# Patient Record
Sex: Female | Born: 1950
Health system: Southern US, Community
[De-identification: ages and names within clinical notes are randomized; demographics above are authoritative.]

## PROBLEM LIST (undated history)

## (undated) DIAGNOSIS — G43909 Migraine, unspecified, not intractable, without status migrainosus: Secondary | ICD-10-CM

## (undated) DIAGNOSIS — F419 Anxiety disorder, unspecified: Secondary | ICD-10-CM

## (undated) DIAGNOSIS — M810 Age-related osteoporosis without current pathological fracture: Secondary | ICD-10-CM

## (undated) DIAGNOSIS — F329 Major depressive disorder, single episode, unspecified: Secondary | ICD-10-CM

## (undated) DIAGNOSIS — M1990 Unspecified osteoarthritis, unspecified site: Secondary | ICD-10-CM

## (undated) DIAGNOSIS — E785 Hyperlipidemia, unspecified: Secondary | ICD-10-CM

## (undated) DIAGNOSIS — M419 Scoliosis, unspecified: Secondary | ICD-10-CM

## (undated) DIAGNOSIS — G4730 Sleep apnea, unspecified: Secondary | ICD-10-CM

## (undated) DIAGNOSIS — I2510 Atherosclerotic heart disease of native coronary artery without angina pectoris: Secondary | ICD-10-CM

## (undated) DIAGNOSIS — F32A Depression, unspecified: Secondary | ICD-10-CM

## (undated) DIAGNOSIS — R42 Dizziness and giddiness: Secondary | ICD-10-CM

## (undated) DIAGNOSIS — H269 Unspecified cataract: Secondary | ICD-10-CM

## (undated) DIAGNOSIS — J439 Emphysema, unspecified: Secondary | ICD-10-CM

## (undated) DIAGNOSIS — J449 Chronic obstructive pulmonary disease, unspecified: Secondary | ICD-10-CM

## (undated) DIAGNOSIS — R5383 Other fatigue: Secondary | ICD-10-CM

## (undated) DIAGNOSIS — R011 Cardiac murmur, unspecified: Secondary | ICD-10-CM

## (undated) DIAGNOSIS — E119 Type 2 diabetes mellitus without complications: Secondary | ICD-10-CM

## (undated) HISTORY — PX: EYE SURGERY: SHX253

## (undated) HISTORY — DX: Anxiety disorder, unspecified: F41.9

## (undated) HISTORY — DX: Type 2 diabetes mellitus without complications: E11.9

## (undated) HISTORY — DX: Sleep apnea, unspecified: G47.30

## (undated) HISTORY — DX: Age-related osteoporosis without current pathological fracture: M81.0

## (undated) HISTORY — DX: Depression, unspecified: F32.A

## (undated) HISTORY — DX: Unspecified cataract: H26.9

## (undated) HISTORY — PX: BREAST CYST ASPIRATION: SHX578

## (undated) HISTORY — DX: Atherosclerotic heart disease of native coronary artery without angina pectoris: I25.10

## (undated) HISTORY — DX: Hyperlipidemia, unspecified: E78.5

## (undated) HISTORY — DX: Chronic obstructive pulmonary disease, unspecified: J44.9

## (undated) HISTORY — DX: Cardiac murmur, unspecified: R01.1

## (undated) HISTORY — DX: Emphysema, unspecified: J43.9

## (undated) HISTORY — DX: Major depressive disorder, single episode, unspecified: F32.9

---
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
## 2004-08-22 HISTORY — PX: COLONOSCOPY: SHX174

## 2005-01-27 ENCOUNTER — Ambulatory Visit: Payer: Self-pay

## 2005-04-13 ENCOUNTER — Ambulatory Visit: Payer: Self-pay

## 2006-02-16 ENCOUNTER — Ambulatory Visit: Payer: Self-pay | Admitting: Family Medicine

## 2007-02-20 ENCOUNTER — Ambulatory Visit: Payer: Self-pay | Admitting: Family Medicine

## 2008-02-25 ENCOUNTER — Ambulatory Visit: Payer: Self-pay | Admitting: Family Medicine

## 2009-02-26 ENCOUNTER — Ambulatory Visit: Payer: Self-pay | Admitting: Family Medicine

## 2010-03-01 ENCOUNTER — Ambulatory Visit: Payer: Self-pay | Admitting: Family Medicine

## 2011-03-03 ENCOUNTER — Ambulatory Visit: Payer: Self-pay | Admitting: Family Medicine

## 2012-03-05 ENCOUNTER — Ambulatory Visit: Payer: Self-pay | Admitting: Family Medicine

## 2013-03-13 ENCOUNTER — Ambulatory Visit: Payer: Self-pay | Admitting: Family Medicine

## 2014-04-11 ENCOUNTER — Ambulatory Visit: Payer: Self-pay | Admitting: Family Medicine

## 2014-08-07 DIAGNOSIS — E782 Mixed hyperlipidemia: Secondary | ICD-10-CM

## 2014-08-07 DIAGNOSIS — E785 Hyperlipidemia, unspecified: Secondary | ICD-10-CM | POA: Insufficient documentation

## 2014-10-13 ENCOUNTER — Ambulatory Visit: Payer: Self-pay | Admitting: Family Medicine

## 2014-12-10 ENCOUNTER — Ambulatory Visit
Admit: 2014-12-10 | Disposition: A | Discharge status: Home / Self Care | Payer: Self-pay | Attending: Family Medicine | Admitting: Family Medicine

## 2015-03-23 ENCOUNTER — Other Ambulatory Visit: Payer: Self-pay | Admitting: Family Medicine

## 2015-04-10 ENCOUNTER — Encounter: Payer: Self-pay | Admitting: Family Medicine

## 2015-04-13 ENCOUNTER — Ambulatory Visit (INDEPENDENT_AMBULATORY_CARE_PROVIDER_SITE_OTHER): Payer: BLUE CROSS/BLUE SHIELD | Admitting: Family Medicine

## 2015-04-13 ENCOUNTER — Encounter: Payer: Self-pay | Admitting: Family Medicine

## 2015-04-13 VITALS — BP 116/80 | HR 94 | Temp 98.6°F | Resp 16 | Ht 68.0 in | Wt 205.3 lb

## 2015-04-13 DIAGNOSIS — Z0000 Encounter for general adult medical examination without abnormal findings: Secondary | ICD-10-CM | POA: Diagnosis not present

## 2015-04-13 DIAGNOSIS — Z87891 Personal history of nicotine dependence: Secondary | ICD-10-CM | POA: Insufficient documentation

## 2015-04-13 NOTE — Progress Notes (Signed)
Name: Kathy Gregory   MRN: [id number]    DOB: 1951/04/18   Date:04/13/2015       Progress Note  Subjective  Chief Complaint  Chief Complaint  Patient presents with  . Annual Exam    HPI  64 year old female here for annual H&P no new exacerbation of her baseline problems.  Past Medical History  Diagnosis Date  . Anxiety   . Depression   . Hyperlipidemia     Social History  Substance Use Topics  . Smoking status: Current Every Day Smoker  . Smokeless tobacco: Not on file  . Alcohol Use: 0.0 oz/week    0 Standard drinks or equivalent per week     Current outpatient prescriptions:  .  atorvastatin (LIPITOR) 10 MG tablet, TAKE 1 TABLET EACH NIGHT AT BEDTIME, Disp: 30 tablet, Rfl: 5 .  calcium-vitamin D (OSCAL WITH D) 250-125 MG-UNIT per tablet, Take 1 tablet by mouth daily., Disp: , Rfl:  .  clonazePAM (KLONOPIN) 0.5 MG tablet, , Disp: , Rfl:  .  Fish Oil-Cholecalciferol (FISH OIL + D3) 1000-1000 MG-UNIT CAPS, Take by mouth., Disp: , Rfl:  .  psyllium (REGULOID) 0.52 G capsule, Take 0.52 g by mouth daily., Disp: , Rfl:  .  traZODone (DESYREL) 50 MG tablet, TAKE 1/2 TO 1 TABLET AT BEDTIME AND 1/4 TABLET TWICE DAILY AS NEEDED, Disp: , Rfl: 5  No Known Allergies  Review of Systems  Constitutional: Negative for fever, chills and weight loss.  HENT: Negative for congestion, hearing loss, sore throat and tinnitus.   Eyes: Negative for blurred vision, double vision and redness.  Respiratory: Negative for cough, hemoptysis and shortness of breath.   Cardiovascular: Negative for chest pain, palpitations, orthopnea, claudication and leg swelling.  Gastrointestinal: Negative for heartburn, nausea, vomiting, diarrhea, constipation and blood in stool.  Genitourinary: Negative for dysuria, urgency, frequency and hematuria.  Musculoskeletal: Negative for myalgias, back pain, joint pain, falls and neck pain.  Skin: Negative for itching.  Neurological: Negative for dizziness, tingling,  tremors, focal weakness, seizures, loss of consciousness, weakness and headaches.  Endo/Heme/Allergies: Does not bruise/bleed easily.  Psychiatric/Behavioral: Positive for depression. Negative for substance abuse. The patient is nervous/anxious. The patient does not have insomnia.      Objective  Filed Vitals:   04/13/15 0949  BP: 116/80  Pulse: 94  Temp: 98.6 F (37 C)  Resp: 16  Height: 5\' 8"  (1.727 m)  Weight: 205 lb 5 oz (93.129 kg)  SpO2: 98%     Physical Exam  Constitutional: She is oriented to person, place, and time and well-developed, well-nourished, and in no distress.  HENT:  Head: Normocephalic.  Eyes: EOM are normal. Pupils are equal, round, and reactive to light.  Neck: Normal range of motion. No thyromegaly present.  Cardiovascular: Normal rate, regular rhythm and normal heart sounds.   No murmur heard. Pulmonary/Chest: Effort normal and breath sounds normal.  Breasts exam is normal  Abdominal: Soft. Bowel sounds are normal.  Musculoskeletal: Normal range of motion. She exhibits no edema.  Neurological: She is alert and oriented to person, place, and time. No cranial nerve deficit. Gait normal.  Skin: Skin is warm and dry. No rash noted.  Psychiatric: Memory and affect normal.      Assessment & Plan  1. Annual physical exam  - CBC - Comprehensive Metabolic Panel (CMET) - TSH - DG Bone Density; Future - MM Digital Screening; Future - Ambulatory referral to Colorectal Surgery

## 2015-04-21 ENCOUNTER — Telehealth: Payer: Self-pay | Admitting: Gastroenterology

## 2015-04-21 NOTE — Telephone Encounter (Signed)
Gastroenterology Pre-Procedure Review  Request Date: 05-15-2015 Requesting Physician: Dr. Rutherford Nail  PATIENT REVIEW QUESTIONS: The patient responded to the following health history questions as indicated:    1. Are you having any GI issues? no 2. Do you have a personal history of Polyps? yes (2004) 3. Do you have a family history of Colon Cancer or Polyps? yes (Polyps) 4. Diabetes Mellitus? no 5. Joint replacements in the past 12 months?no 6. Major health problems in the past 3 months?no 7. Any artificial heart valves, MVP, or defibrillator?no    MEDICATIONS & ALLERGIES:    Patient reports the following regarding taking any anticoagulation/antiplatelet therapy:   Plavix, Coumadin, Eliquis, Xarelto, Lovenox, Pradaxa, Brilinta, or Effient? no Aspirin? yes (Daily)  Patient confirms/reports the following medications:  Current Outpatient Prescriptions  Medication Sig Dispense Refill   atorvastatin (LIPITOR) 10 MG tablet TAKE 1 TABLET EACH NIGHT AT BEDTIME 30 tablet 5   calcium-vitamin D (OSCAL WITH D) 250-125 MG-UNIT per tablet Take 1 tablet by mouth daily.     clonazePAM (KLONOPIN) 0.5 MG tablet      Fish Oil-Cholecalciferol (FISH OIL + D3) 1000-1000 MG-UNIT CAPS Take by mouth.     psyllium (REGULOID) 0.52 G capsule Take 0.52 g by mouth daily.     traZODone (DESYREL) 50 MG tablet TAKE 1/2 TO 1 TABLET AT BEDTIME AND 1/4 TABLET TWICE DAILY AS NEEDED  5   No current facility-administered medications for this visit.    Patient confirms/reports the following allergies:  No Known Allergies  NO  No orders of the defined types were placed in this encounter.    AUTHORIZATION INFORMATION Primary Insurance: 1D#: Group #:  Secondary Insurance: 1D#: Group #:  SCHEDULE INFORMATION: Date: 05-15-2015 Time: Location:05-15-2015

## 2015-04-22 ENCOUNTER — Ambulatory Visit
Admission: RE | Admit: 2015-04-22 | Discharge: 2015-04-22 | Disposition: A | Discharge status: Home / Self Care | Payer: BLUE CROSS/BLUE SHIELD | Source: Ambulatory Visit | Attending: Family Medicine | Admitting: Family Medicine

## 2015-04-22 DIAGNOSIS — Z1231 Encounter for screening mammogram for malignant neoplasm of breast: Secondary | ICD-10-CM | POA: Diagnosis not present

## 2015-04-22 DIAGNOSIS — Z780 Asymptomatic menopausal state: Secondary | ICD-10-CM | POA: Insufficient documentation

## 2015-04-22 DIAGNOSIS — Z0000 Encounter for general adult medical examination without abnormal findings: Secondary | ICD-10-CM

## 2015-04-23 ENCOUNTER — Telehealth: Payer: Self-pay | Admitting: Family Medicine

## 2015-04-23 NOTE — Telephone Encounter (Signed)
Patient notified. Duplicate message

## 2015-04-23 NOTE — Telephone Encounter (Signed)
Patient is returning your call for test results. Please call her at [phone]

## 2015-05-14 ENCOUNTER — Other Ambulatory Visit: Payer: Self-pay

## 2015-05-15 ENCOUNTER — Other Ambulatory Visit: Payer: Self-pay | Admitting: Gastroenterology

## 2015-05-15 ENCOUNTER — Ambulatory Visit: Payer: BLUE CROSS/BLUE SHIELD | Admitting: Anesthesiology

## 2015-05-15 ENCOUNTER — Ambulatory Visit
Admission: RE | Admit: 2015-05-15 | Discharge: 2015-05-15 | Disposition: A | Discharge status: Home / Self Care | Payer: BLUE CROSS/BLUE SHIELD | Source: Ambulatory Visit | Attending: Gastroenterology | Admitting: Gastroenterology

## 2015-05-15 ENCOUNTER — Encounter
Admission: RE | Disposition: A | Discharge status: Home / Self Care | Payer: Self-pay | Source: Ambulatory Visit | Attending: Gastroenterology

## 2015-05-15 ENCOUNTER — Encounter: Payer: Self-pay | Admitting: *Deleted

## 2015-05-15 DIAGNOSIS — E785 Hyperlipidemia, unspecified: Secondary | ICD-10-CM | POA: Diagnosis not present

## 2015-05-15 DIAGNOSIS — K640 First degree hemorrhoids: Secondary | ICD-10-CM | POA: Diagnosis not present

## 2015-05-15 DIAGNOSIS — F329 Major depressive disorder, single episode, unspecified: Secondary | ICD-10-CM | POA: Diagnosis not present

## 2015-05-15 DIAGNOSIS — F419 Anxiety disorder, unspecified: Secondary | ICD-10-CM | POA: Insufficient documentation

## 2015-05-15 DIAGNOSIS — M419 Scoliosis, unspecified: Secondary | ICD-10-CM | POA: Insufficient documentation

## 2015-05-15 DIAGNOSIS — Z1211 Encounter for screening for malignant neoplasm of colon: Secondary | ICD-10-CM | POA: Insufficient documentation

## 2015-05-15 DIAGNOSIS — R51 Headache: Secondary | ICD-10-CM | POA: Diagnosis not present

## 2015-05-15 DIAGNOSIS — Z86010 Personal history of colonic polyps: Secondary | ICD-10-CM | POA: Insufficient documentation

## 2015-05-15 DIAGNOSIS — K635 Polyp of colon: Secondary | ICD-10-CM | POA: Diagnosis not present

## 2015-05-15 DIAGNOSIS — M1389 Other specified arthritis, multiple sites: Secondary | ICD-10-CM | POA: Insufficient documentation

## 2015-05-15 DIAGNOSIS — F17210 Nicotine dependence, cigarettes, uncomplicated: Secondary | ICD-10-CM | POA: Insufficient documentation

## 2015-05-15 DIAGNOSIS — D125 Benign neoplasm of sigmoid colon: Secondary | ICD-10-CM | POA: Diagnosis not present

## 2015-05-15 DIAGNOSIS — Z7982 Long term (current) use of aspirin: Secondary | ICD-10-CM | POA: Diagnosis not present

## 2015-05-15 HISTORY — DX: Scoliosis, unspecified: M41.9

## 2015-05-15 HISTORY — PX: COLONOSCOPY WITH PROPOFOL: SHX5780

## 2015-05-15 HISTORY — DX: Migraine, unspecified, not intractable, without status migrainosus: G43.909

## 2015-05-15 HISTORY — PX: POLYPECTOMY: SHX5525

## 2015-05-15 HISTORY — DX: Dizziness and giddiness: R42

## 2015-05-15 HISTORY — DX: Unspecified osteoarthritis, unspecified site: M19.90

## 2015-05-15 SURGERY — COLONOSCOPY WITH PROPOFOL
Anesthesia: Monitor Anesthesia Care | Wound class: Contaminated

## 2015-05-15 MED ORDER — LIDOCAINE HCL (CARDIAC) 20 MG/ML IV SOLN
INTRAVENOUS | Status: DC | PRN
  Administered 2015-05-15: 60 mg via INTRAVENOUS

## 2015-05-15 MED ORDER — ACETAMINOPHEN 160 MG/5ML PO SOLN: 325.0000 mg | ORAL | Status: DC | PRN

## 2015-05-15 MED ORDER — STERILE WATER FOR IRRIGATION IR SOLN
Status: DC | PRN
  Administered 2015-05-15: 12:00:00

## 2015-05-15 MED ORDER — ACETAMINOPHEN 325 MG PO TABS: 325.0000 mg | ORAL_TABLET | ORAL | Status: DC | PRN

## 2015-05-15 MED ORDER — LACTATED RINGERS IV SOLN
INTRAVENOUS | Status: DC
Start: 2015-05-15 — End: 2015-05-15
  Administered 2015-05-15 (×2): via INTRAVENOUS

## 2015-05-15 MED ORDER — PROPOFOL 10 MG/ML IV BOLUS
INTRAVENOUS | Status: DC | PRN
  Administered 2015-05-15: 30 mg via INTRAVENOUS
  Administered 2015-05-15 (×2): 20 mg via INTRAVENOUS
  Administered 2015-05-15: 100 mg via INTRAVENOUS

## 2015-05-15 SURGICAL SUPPLY — 28 items

## 2015-05-15 NOTE — Op Note (Signed)
[facility] Gastroenterology Patient Name: Kathy Gregory Procedure Date: 05/15/2015 11:41 AM MRN: [id number] Account #: [id number] Date of Birth: 1951/04/29 Admit Type: Outpatient Age: 64 Room: [facility] OR ROOM 01 Gender: Female Note Status: Finalized Procedure:         Colonoscopy Indications:       High risk colon cancer surveillance: Personal history of                     colonic polyps Providers:         Lucilla Lame, MD Referring MD:      Ashok Norris, MD (Referring MD) Medicines:         Propofol per Anesthesia Complications:     No immediate complications. Procedure:         Pre-Anesthesia Assessment:                    - Prior to the procedure, a History and Physical was                     performed, and patient medications and allergies were                     reviewed. The patient's tolerance of previous anesthesia                     was also reviewed. The risks and benefits of the procedure                     and the sedation options and risks were discussed with the                     patient. All questions were answered, and informed consent                     was obtained. Prior Anticoagulants: The patient has taken                     no previous anticoagulant or antiplatelet agents. ASA                     Grade Assessment: II - A patient with mild systemic                     disease. After reviewing the risks and benefits, the                     patient was deemed in satisfactory condition to undergo                     the procedure.                    After obtaining informed consent, the colonoscope was                     passed under direct vision. Throughout the procedure, the                     patient's blood pressure, pulse, and oxygen saturations                     were monitored continuously. The was introduced through  the anus and advanced to the the cecum, identified by                     appendiceal  orifice and ileocecal valve. The colonoscopy                     was performed without difficulty. The patient tolerated                     the procedure well. The quality of the bowel preparation                     was excellent. Findings:      The perianal and digital rectal examinations were normal.      Four sessile polyps were found in the sigmoid colon. The polyps were 3       to 5 mm in size. These polyps were removed with a cold biopsy forceps.       Resection and retrieval were complete.      Non-bleeding internal hemorrhoids were found during retroflexion. The       hemorrhoids were Grade I (internal hemorrhoids that do not prolapse). Impression:        - Four 3 to 5 mm polyps in the sigmoid colon. Resected and                     retrieved.                    - Non-bleeding internal hemorrhoids. Recommendation:    - Repeat colonoscopy in 5 years if polyp adenoma and 10                     years if hyperplastic Procedure Code(s): --- Professional ---                    [phone], Colonoscopy, flexible; with biopsy, single or                     multiple Diagnosis Code(s): --- Professional ---                    Z86.010, Personal history of colonic polyps                    D12.5, Benign neoplasm of sigmoid colon CPT copyright 2014 American Medical Association. All rights reserved. The codes documented in this report are preliminary and upon coder review may  be revised to meet current compliance requirements. Lucilla Lame, MD 05/15/2015 12:02:59 PM This report has been signed electronically. Number of Addenda: 0 Note Initiated On: 05/15/2015 11:41 AM Scope Withdrawal Time: 0 hours 7 minutes 32 seconds  Total Procedure Duration: 0 hours 9 minutes 39 seconds       [facility]

## 2015-05-15 NOTE — Anesthesia Procedure Notes (Signed)
Procedure Name: MAC Performed by: Jendaya Gossett Pre-anesthesia Checklist: Patient identified, Emergency Drugs available, Suction available, Timeout performed and Patient being monitored Patient Re-evaluated:Patient Re-evaluated prior to inductionOxygen Delivery Method: Nasal cannula Placement Confirmation: positive ETCO2     

## 2015-05-15 NOTE — Anesthesia Postprocedure Evaluation (Signed)
  Anesthesia Post-op Note  Patient: Kathy Gregory  Procedure(s) Performed: Procedure(s): COLONOSCOPY WITH PROPOFOL (N/A) POLYPECTOMY  Anesthesia type:MAC  Patient location: PACU  Post pain: Pain level controlled  Post assessment: Post-op Vital signs reviewed, Patient's Cardiovascular Status Stable, Respiratory Function Stable, Patent Airway and No signs of Nausea or vomiting  Post vital signs: Reviewed and stable  Last Vitals:  Filed Vitals:   05/15/15 1048  BP: 105/60  Pulse: 99  Temp: 36.5 C  Resp: 18    Level of consciousness: awake, alert  and patient cooperative  Complications: No apparent anesthesia complications

## 2015-05-15 NOTE — H&P (Signed)
  [facility]  [address], [place], [place] [address] Phone: [phone] Fax : [phone]  Primary Care Physician:  Ashok Norris, MD Primary Gastroenterologist:  Dr. Allen Norris  Pre-Procedure History & Physical: HPI:  Kathy Gregory is a 64 y.o. female is here for an colonoscopy.   Past Medical History  Diagnosis Date  . Anxiety   . Depression   . Hyperlipidemia   . Scoliosis     mild  . Arthritis     hips, collarbone  . Vertigo     2-3x/yr  . Migraines     4-5 per year    Past Surgical History  Procedure Laterality Date  . Colonoscopy  2006    Prior to Admission medications   Medication Sig Start Date End Date Taking? Authorizing Provider  aspirin 325 MG tablet Take 325 mg by mouth daily. PM   Yes Historical Provider, MD  atorvastatin (LIPITOR) 10 MG tablet TAKE 1 TABLET EACH NIGHT AT BEDTIME 03/23/15  Yes Ashok Norris, MD  calcium-vitamin D (OSCAL WITH D) 250-125 MG-UNIT per tablet Take 1 tablet by mouth daily.   Yes Historical Provider, MD  clonazePAM (KLONOPIN) 0.5 MG tablet  04/12/15  Yes Historical Provider, MD  Fish Oil-Cholecalciferol (FISH OIL + D3) 1000-1000 MG-UNIT CAPS Take by mouth.   Yes Historical Provider, MD  psyllium (REGULOID) 0.52 G capsule Take 0.52 g by mouth daily.   Yes Historical Provider, MD  traZODone (DESYREL) 50 MG tablet TAKE 1/2 TO 1 TABLET AT BEDTIME AND 1/4 TABLET TWICE DAILY AS NEEDED 03/15/15  Yes Historical Provider, MD    Allergies as of 05/14/2015  . (No Known Allergies)    Family History  Problem Relation Age of Onset  . Heart disease Mother   . Heart disease Father   . Breast cancer Paternal Grandmother     Social History   Social History  . Marital Status: Married    Spouse Name: N/A  . Number of Children: N/A  . Years of Education: N/A   Occupational History  . Not on file.   Social History Main Topics  . Smoking status: Current Every Day Smoker -- 0.75 packs/day for 40 years    Types:  Cigarettes  . Smokeless tobacco: Not on file  . Alcohol Use: 0.6 oz/week    0 Standard drinks or equivalent, 1 Glasses of wine per week  . Drug Use: No  . Sexual Activity: No   Other Topics Concern  . Not on file   Social History Narrative    Review of Systems: See HPI, otherwise negative ROS  Physical Exam: BP 105/60 mmHg  Pulse 99  Temp(Src) 97.7 F (36.5 C) (Temporal)  Resp 18  Ht 5\' 8"  (1.727 m)  Wt 202 lb (91.627 kg)  BMI 30.72 kg/m2  SpO2 97% General:   Alert,  pleasant and cooperative in NAD Head:  Normocephalic and atraumatic. Neck:  Supple; no masses or thyromegaly. Lungs:  Clear throughout to auscultation.    Heart:  Regular rate and rhythm. Abdomen:  Soft, nontender and nondistended. Normal bowel sounds, without guarding, and without rebound.   Neurologic:  Alert and  oriented x4;  grossly normal neurologically.  Impression/Plan: Kathy Gregory is here for an colonoscopy to be performed for history of colon polyps.  Risks, benefits, limitations, and alternatives regarding  colonoscopy have been reviewed with the patient.  Questions have been answered.  All parties agreeable.   Ollen Bowl, MD  05/15/2015, 11:42 AM

## 2015-05-15 NOTE — Transfer of Care (Signed)
Immediate Anesthesia Transfer of Care Note  Patient: Kathy Gregory  Procedure(s) Performed: Procedure(s): COLONOSCOPY WITH PROPOFOL (N/A) POLYPECTOMY  Patient Location: PACU  Anesthesia Type: MAC  Level of Consciousness: awake, alert  and patient cooperative  Airway and Oxygen Therapy: Patient Spontanous Breathing and Patient connected to supplemental oxygen  Post-op Assessment: Post-op Vital signs reviewed, Patient's Cardiovascular Status Stable, Respiratory Function Stable, Patent Airway and No signs of Nausea or vomiting  Post-op Vital Signs: Reviewed and stable  Complications: No apparent anesthesia complications

## 2015-05-15 NOTE — Anesthesia Preprocedure Evaluation (Signed)
Anesthesia Evaluation  Patient identified by MRN, date of birth, ID band Patient awake    Reviewed: Allergy & Precautions, H&P , NPO status , Patient's Chart, lab work & pertinent test results  Airway Mallampati: I  TM Distance: >3 FB Neck ROM: full    Dental no notable dental hx.    Pulmonary Current Smoker,    Pulmonary exam normal        Cardiovascular negative cardio ROS Normal cardiovascular exam     Neuro/Psych  Headaches, PSYCHIATRIC DISORDERS    GI/Hepatic negative GI ROS, Neg liver ROS,   Endo/Other  negative endocrine ROS  Renal/GU negative Renal ROS  negative genitourinary   Musculoskeletal   Abdominal   Peds  Hematology negative hematology ROS (+)   Anesthesia Other Findings   Reproductive/Obstetrics                             Anesthesia Physical Anesthesia Plan  ASA: II  Anesthesia Plan: MAC   Post-op Pain Management:    Induction:   Airway Management Planned: Nasal Cannula  Additional Equipment:   Intra-op Plan:   Post-operative Plan:   Informed Consent:   Plan Discussed with: CRNA  Anesthesia Plan Comments:         Anesthesia Quick Evaluation

## 2015-05-18 ENCOUNTER — Encounter: Payer: Self-pay | Admitting: Gastroenterology

## 2015-05-20 ENCOUNTER — Encounter: Payer: Self-pay | Admitting: Gastroenterology

## 2015-06-14 ENCOUNTER — Other Ambulatory Visit: Payer: Self-pay | Admitting: Family Medicine

## 2015-06-15 ENCOUNTER — Telehealth: Payer: Self-pay | Admitting: Family Medicine

## 2015-06-15 NOTE — Telephone Encounter (Signed)
Trazadone was written 90 tablets 1/2-1 tablet @ bedtime and 1/4 tablet twice daily. With these instructions the pharmacy is requesting a supply of 135 tablets for 90 days.

## 2015-06-24 NOTE — Telephone Encounter (Signed)
[facility] notified.

## 2015-09-10 ENCOUNTER — Other Ambulatory Visit: Payer: Self-pay | Admitting: Family Medicine

## 2015-10-15 ENCOUNTER — Ambulatory Visit: Payer: BLUE CROSS/BLUE SHIELD | Admitting: Family Medicine

## 2015-11-26 ENCOUNTER — Ambulatory Visit: Payer: BLUE CROSS/BLUE SHIELD | Admitting: Family Medicine

## 2016-02-24 ENCOUNTER — Telehealth: Payer: Self-pay | Admitting: Family Medicine

## 2016-03-01 NOTE — Telephone Encounter (Signed)
April 07, 2015 SGPT and lipid panel reviewed; Rx approved Patient will be due for her upcoming physical in late August; would you contact her and offer for her to be seen by me? I'll be glad to see her and we can order labs (fasting) then; thank you

## 2016-03-02 NOTE — Telephone Encounter (Signed)
LMOM for pt to call the office to schedule an appt °

## 2016-03-03 NOTE — Telephone Encounter (Signed)
Patient scheduled her annual physical for 04/18/16 with you.

## 2016-04-11 ENCOUNTER — Other Ambulatory Visit: Payer: Self-pay | Admitting: Family Medicine

## 2016-04-18 ENCOUNTER — Ambulatory Visit (INDEPENDENT_AMBULATORY_CARE_PROVIDER_SITE_OTHER): Payer: BLUE CROSS/BLUE SHIELD | Admitting: Family Medicine

## 2016-04-18 ENCOUNTER — Encounter: Payer: Self-pay | Admitting: Family Medicine

## 2016-04-18 VITALS — BP 126/78 | HR 99 | Temp 98.3°F | Resp 14 | Ht 67.5 in | Wt 220.0 lb

## 2016-04-18 DIAGNOSIS — Z1159 Encounter for screening for other viral diseases: Secondary | ICD-10-CM | POA: Diagnosis not present

## 2016-04-18 DIAGNOSIS — Z0000 Encounter for general adult medical examination without abnormal findings: Secondary | ICD-10-CM | POA: Diagnosis not present

## 2016-04-18 DIAGNOSIS — Z720 Tobacco use: Secondary | ICD-10-CM | POA: Diagnosis not present

## 2016-04-18 DIAGNOSIS — M85859 Other specified disorders of bone density and structure, unspecified thigh: Secondary | ICD-10-CM | POA: Insufficient documentation

## 2016-04-18 DIAGNOSIS — Z23 Encounter for immunization: Secondary | ICD-10-CM | POA: Diagnosis not present

## 2016-04-18 DIAGNOSIS — Z1239 Encounter for other screening for malignant neoplasm of breast: Secondary | ICD-10-CM | POA: Diagnosis not present

## 2016-04-18 DIAGNOSIS — M8580 Other specified disorders of bone density and structure, unspecified site: Secondary | ICD-10-CM | POA: Diagnosis not present

## 2016-04-18 DIAGNOSIS — R635 Abnormal weight gain: Secondary | ICD-10-CM | POA: Diagnosis not present

## 2016-04-18 LAB — LIPID PANEL
CHOL/HDL RATIO: 2.4 ratio (ref <=5.0)
Cholesterol: 161 mg/dL (ref 125–200)
HDL: 66 mg/dL (ref >=46)
LDL CALC: 78 mg/dL (ref <130)
TRIGLYCERIDES: 85 mg/dL (ref <150)
VLDL: 17 mg/dL (ref <30)

## 2016-04-18 LAB — COMPLETE METABOLIC PANEL WITH GFR
ALT: 18 U/L (ref 6–29)
AST: 17 U/L (ref 10–35)
Albumin: 4.3 g/dL (ref 3.6–5.1)
Alkaline Phosphatase: 99 U/L (ref 33–130)
BUN: 18 mg/dL (ref 7–25)
CALCIUM: 10 mg/dL (ref 8.6–10.4)
CHLORIDE: 105 mmol/L (ref 98–110)
CO2: 27 mmol/L (ref 20–31)
Creat: 0.9 mg/dL (ref 0.50–0.99)
GFR, EST AFRICAN AMERICAN: 79 mL/min (ref >=60)
GFR, EST NON AFRICAN AMERICAN: 68 mL/min (ref >=60)
Glucose, Bld: 90 mg/dL (ref 65–99)
POTASSIUM: 4.5 mmol/L (ref 3.5–5.3)
Sodium: 142 mmol/L (ref 135–146)
Total Bilirubin: 0.4 mg/dL (ref 0.2–1.2)
Total Protein: 7.4 g/dL (ref 6.1–8.1)

## 2016-04-18 LAB — CBC WITH DIFFERENTIAL/PLATELET
BASOS PCT: 1 %
Basophils Absolute: 76 cells/uL (ref 0–200)
Eosinophils Absolute: 228 cells/uL (ref 15–500)
Eosinophils Relative: 3 %
HEMATOCRIT: 46.3 % — AB (ref 35.0–45.0)
HEMOGLOBIN: 15.7 g/dL — AB (ref 11.7–15.5)
Lymphocytes Relative: 23 %
Lymphs Abs: 1748 cells/uL (ref 850–3900)
MCH: 31.8 pg (ref 27.0–33.0)
MCHC: 33.9 g/dL (ref 32.0–36.0)
MCV: 93.7 fL (ref 80.0–100.0)
MPV: 11.1 fL (ref 7.5–12.5)
Monocytes Absolute: 760 cells/uL (ref 200–950)
Monocytes Relative: 10 %
NEUTROS PCT: 63 %
Neutro Abs: 4788 cells/uL (ref 1500–7800)
Platelets: 312 10*3/uL (ref 140–400)
RBC: 4.94 MIL/uL (ref 3.80–5.10)
RDW: 13 % (ref 11.0–15.0)
WBC: 7.6 10*3/uL (ref 3.8–10.8)

## 2016-04-18 LAB — HEPATITIS C ANTIBODY: HCV AB: NEGATIVE

## 2016-04-18 LAB — TSH: TSH: 1.91 m[IU]/L

## 2016-04-18 NOTE — Assessment & Plan Note (Signed)
Ordered mammo 

## 2016-04-18 NOTE — Patient Instructions (Addendum)
I've ordered a chest CT for lung cancer screening  I do encourage you to quit smoking Call [phone] to sign up for smoking cessation classes You can call 1-800-QUIT-NOW to talk with a smoking cessation coach  Check out the information at familydoctor.org entitled "Nutrition for Weight Loss: What You Need to Know about Fad Diets" Try to lose between 1-2 pounds per week by taking in fewer calories and burning off more calories You can succeed by limiting portions, limiting foods dense in calories and fat, becoming more active, and drinking 8 glasses of water a day (64 ounces) Don't skip meals, especially breakfast, as skipping meals may alter your metabolism Do not use over-the-counter weight loss pills or gimmicks that claim rapid weight loss A healthy BMI (or body mass index) is between 18.5 and 24.9 You can calculate your ideal BMI at the [place] website [employer]fr  Smoking Cessation, Tips for Success If you are ready to quit smoking, congratulations! You have chosen to help yourself be healthier. Cigarettes bring nicotine, tar, carbon monoxide, and other irritants into your body. Your lungs, heart, and blood vessels will be able to work better without these poisons. There are many different ways to quit smoking. Nicotine gum, nicotine patches, a nicotine inhaler, or nicotine nasal spray can help with physical craving. Hypnosis, support groups, and medicines help break the habit of smoking. WHAT THINGS CAN I DO TO MAKE QUITTING EASIER?  Here are some tips to help you quit for good:  Pick a date when you will quit smoking completely. Tell all of your friends and family about your plan to quit on that date.  Do not try to slowly cut down on the number of cigarettes you are smoking. Pick a quit date and quit smoking completely starting on that day.  Throw away all cigarettes.   Clean and remove all ashtrays from your home, work, and  car.  On a card, write down your reasons for quitting. Carry the card with you and read it when you get the urge to smoke.  Cleanse your body of nicotine. Drink enough water and fluids to keep your urine clear or pale yellow. Do this after quitting to flush the nicotine from your body.  Learn to predict your moods. Do not let a bad situation be your excuse to have a cigarette. Some situations in your life might tempt you into wanting a cigarette.  Never have "just one" cigarette. It leads to wanting another and another. Remind yourself of your decision to quit.  Change habits associated with smoking. If you smoked while driving or when feeling stressed, try other activities to replace smoking. Stand up when drinking your coffee. Brush your teeth after eating. Sit in a different chair when you read the paper. Avoid alcohol while trying to quit, and try to drink fewer caffeinated beverages. Alcohol and caffeine may urge you to smoke.  Avoid foods and drinks that can trigger a desire to smoke, such as sugary or spicy foods and alcohol.  Ask people who smoke not to smoke around you.  Have something planned to do right after eating or having a cup of coffee. For example, plan to take a walk or exercise.  Try a relaxation exercise to calm you down and decrease your stress. Remember, you may be tense and nervous for the first 2 weeks after you quit, but this will pass.  Find new activities to keep your hands busy. Play with a pen, coin, or rubber band. Doodle or  draw things on paper.  Brush your teeth right after eating. This will help cut down on the craving for the taste of tobacco after meals. You can also try mouthwash.   Use oral substitutes in place of cigarettes. Try using lemon drops, carrots, cinnamon sticks, or chewing gum. Keep them handy so they are available when you have the urge to smoke.  When you have the urge to smoke, try deep breathing.  Designate your home as a nonsmoking  area.  If you are a heavy smoker, ask your health care provider about a prescription for nicotine chewing gum. It can ease your withdrawal from nicotine.  Reward yourself. Set aside the cigarette money you save and buy yourself something nice.  Look for support from others. Join a support group or smoking cessation program. Ask someone at home or at work to help you with your plan to quit smoking.  Always ask yourself, "Do I need this cigarette or is this just a reflex?" Tell yourself, "Today, I choose not to smoke," or "I do not want to smoke." You are reminding yourself of your decision to quit.  Do not replace cigarette smoking with electronic cigarettes (commonly called e-cigarettes). The safety of e-cigarettes is unknown, and some may contain harmful chemicals.  If you relapse, do not give up! Plan ahead and think about what you will do the next time you get the urge to smoke. HOW WILL I FEEL WHEN I QUIT SMOKING? You may have symptoms of withdrawal because your body is used to nicotine (the addictive substance in cigarettes). You may crave cigarettes, be irritable, feel very hungry, cough often, get headaches, or have difficulty concentrating. The withdrawal symptoms are only temporary. They are strongest when you first quit but will go away within 10-14 days. When withdrawal symptoms occur, stay in control. Think about your reasons for quitting. Remind yourself that these are signs that your body is healing and getting used to being without cigarettes. Remember that withdrawal symptoms are easier to treat than the major diseases that smoking can cause.  Even after the withdrawal is over, expect periodic urges to smoke. However, these cravings are generally short lived and will go away whether you smoke or not. Do not smoke! WHAT RESOURCES ARE AVAILABLE TO HELP ME QUIT SMOKING? Your health care provider can direct you to community resources or hospitals for support, which may include:  Group  support.  Education.  Hypnosis.  Therapy.   This information is not intended to replace advice given to you by your health care provider. Make sure you discuss any questions you have with your health care provider.   Document Released: 05/06/2004 Document Revised: 08/29/2014 Document Reviewed: 01/24/2013 Elsevier Interactive Patient Education 2016 Walker Maintenance, Female Adopting a healthy lifestyle and getting preventive care can go a long way to promote health and wellness. Talk with your health care provider about what schedule of regular examinations is right for you. This is a good chance for you to check in with your provider about disease prevention and staying healthy. In between checkups, there are plenty of things you can do on your own. Experts have done a lot of research about which lifestyle changes and preventive measures are most likely to keep you healthy. Ask your health care provider for more information. WEIGHT AND DIET  Eat a healthy diet  Be sure to include plenty of vegetables, fruits, low-fat dairy products, and lean protein.  Do not eat a lot of foods  high in solid fats, added sugars, or salt.  Get regular exercise. This is one of the most important things you can do for your health.  Most adults should exercise for at least 150 minutes each week. The exercise should increase your heart rate and make you sweat (moderate-intensity exercise).  Most adults should also do strengthening exercises at least twice a week. This is in addition to the moderate-intensity exercise.  Maintain a healthy weight  Body mass index (BMI) is a measurement that can be used to identify possible weight problems. It estimates body fat based on height and weight. Your health care provider can help determine your BMI and help you achieve or maintain a healthy weight.  For females 73 years of age and older:   A BMI below 18.5 is considered underweight.  A BMI of  18.5 to 24.9 is normal.  A BMI of 25 to 29.9 is considered overweight.  A BMI of 30 and above is considered obese.  Watch levels of cholesterol and blood lipids  You should start having your blood tested for lipids and cholesterol at 64 years of age, then have this test every 5 years.  You may need to have your cholesterol levels checked more often if:  Your lipid or cholesterol levels are high.  You are older than 64 years of age.  You are at high risk for heart disease.  CANCER SCREENING   Lung Cancer  Lung cancer screening is recommended for adults 9-38 years old who are at high risk for lung cancer because of a history of smoking.  A yearly low-dose CT scan of the lungs is recommended for people who:  Currently smoke.  Have quit within the past 15 years.  Have at least a 30-pack-year history of smoking. A pack year is smoking an average of one pack of cigarettes a day for 1 year.  Yearly screening should continue until it has been 15 years since you quit.  Yearly screening should stop if you develop a health problem that would prevent you from having lung cancer treatment.  Breast Cancer  Practice breast self-awareness. This means understanding how your breasts normally appear and feel.  It also means doing regular breast self-exams. Let your health care provider know about any changes, no matter how small.  If you are in your 20s or 30s, you should have a clinical breast exam (CBE) by a health care provider every 1-3 years as part of a regular health exam.  If you are 1 or older, have a CBE every year. Also consider having a breast X-ray (mammogram) every year.  If you have a family history of breast cancer, talk to your health care provider about genetic screening.  If you are at high risk for breast cancer, talk to your health care provider about having an MRI and a mammogram every year.  Breast cancer gene (BRCA) assessment is recommended for women who  have family members with BRCA-related cancers. BRCA-related cancers include:  Breast.  Ovarian.  Tubal.  Peritoneal cancers.  Results of the assessment will determine the need for genetic counseling and BRCA1 and BRCA2 testing. Cervical Cancer Your health care provider may recommend that you be screened regularly for cancer of the pelvic organs (ovaries, uterus, and vagina). This screening involves a pelvic examination, including checking for microscopic changes to the surface of your cervix (Pap test). You may be encouraged to have this screening done every 3 years, beginning at age 89.  For women  ages 58-65, health care providers may recommend pelvic exams and Pap testing every 3 years, or they may recommend the Pap and pelvic exam, combined with testing for human papilloma virus (HPV), every 5 years. Some types of HPV increase your risk of cervical cancer. Testing for HPV may also be done on women of any age with unclear Pap test results.  Other health care providers may not recommend any screening for nonpregnant women who are considered low risk for pelvic cancer and who do not have symptoms. Ask your health care provider if a screening pelvic exam is right for you.  If you have had past treatment for cervical cancer or a condition that could lead to cancer, you need Pap tests and screening for cancer for at least 20 years after your treatment. If Pap tests have been discontinued, your risk factors (such as having a new sexual partner) need to be reassessed to determine if screening should resume. Some women have medical problems that increase the chance of getting cervical cancer. In these cases, your health care provider may recommend more frequent screening and Pap tests. Colorectal Cancer  This type of cancer can be detected and often prevented.  Routine colorectal cancer screening usually begins at 64 years of age and continues through 64 years of age.  Your health care provider  may recommend screening at an earlier age if you have risk factors for colon cancer.  Your health care provider may also recommend using home test kits to check for hidden blood in the stool.  A small camera at the end of a tube can be used to examine your colon directly (sigmoidoscopy or colonoscopy). This is done to check for the earliest forms of colorectal cancer.  Routine screening usually begins at age 33.  Direct examination of the colon should be repeated every 5-10 years through 64 years of age. However, you may need to be screened more often if early forms of precancerous polyps or small growths are found. Skin Cancer  Check your skin from head to toe regularly.  Tell your health care provider about any new moles or changes in moles, especially if there is a change in a mole's shape or color.  Also tell your health care provider if you have a mole that is larger than the size of a pencil eraser.  Always use sunscreen. Apply sunscreen liberally and repeatedly throughout the day.  Protect yourself by wearing long sleeves, pants, a wide-brimmed hat, and sunglasses whenever you are outside. HEART DISEASE, DIABETES, AND HIGH BLOOD PRESSURE   High blood pressure causes heart disease and increases the risk of stroke. High blood pressure is more likely to develop in:  People who have blood pressure in the high end of the normal range (130-139/85-89 mm Hg).  People who are overweight or obese.  People who are African American.  If you are 52-67 years of age, have your blood pressure checked every 3-5 years. If you are 67 years of age or older, have your blood pressure checked every year. You should have your blood pressure measured twice--once when you are at a hospital or clinic, and once when you are not at a hospital or clinic. Record the average of the two measurements. To check your blood pressure when you are not at a hospital or clinic, you can use:  An automated blood  pressure machine at a pharmacy.  A home blood pressure monitor.  If you are between 61 years and 33 years old,  ask your health care provider if you should take aspirin to prevent strokes.  Have regular diabetes screenings. This involves taking a blood sample to check your fasting blood sugar level.  If you are at a normal weight and have a low risk for diabetes, have this test once every three years after 64 years of age.  If you are overweight and have a high risk for diabetes, consider being tested at a younger age or more often. PREVENTING INFECTION  Hepatitis B  If you have a higher risk for hepatitis B, you should be screened for this virus. You are considered at high risk for hepatitis B if:  You were born in a country where hepatitis B is common. Ask your health care provider which countries are considered high risk.  Your parents were born in a high-risk country, and you have not been immunized against hepatitis B (hepatitis B vaccine).  You have HIV or AIDS.  You use needles to inject street drugs.  You live with someone who has hepatitis B.  You have had sex with someone who has hepatitis B.  You get hemodialysis treatment.  You take certain medicines for conditions, including cancer, organ transplantation, and autoimmune conditions. Hepatitis C  Blood testing is recommended for:  Everyone born from [birth year] through 1964.  Anyone with known risk factors for hepatitis C. Sexually transmitted infections (STIs)  You should be screened for sexually transmitted infections (STIs) including gonorrhea and chlamydia if:  You are sexually active and are younger than 64 years of age.  You are older than 64 years of age and your health care provider tells you that you are at risk for this type of infection.  Your sexual activity has changed since you were last screened and you are at an increased risk for chlamydia or gonorrhea. Ask your health care provider if you are at  risk.  If you do not have HIV, but are at risk, it may be recommended that you take a prescription medicine daily to prevent HIV infection. This is called pre-exposure prophylaxis (PrEP). You are considered at risk if:  You are sexually active and do not regularly use condoms or know the HIV status of your partner(s).  You take drugs by injection.  You are sexually active with a partner who has HIV. Talk with your health care provider about whether you are at high risk of being infected with HIV. If you choose to begin PrEP, you should first be tested for HIV. You should then be tested every 3 months for as long as you are taking PrEP.  PREGNANCY   If you are premenopausal and you may become pregnant, ask your health care provider about preconception counseling.  If you may become pregnant, take 400 to 800 micrograms (mcg) of folic acid every day.  If you want to prevent pregnancy, talk to your health care provider about birth control (contraception). OSTEOPOROSIS AND MENOPAUSE   Osteoporosis is a disease in which the bones lose minerals and strength with aging. This can result in serious bone fractures. Your risk for osteoporosis can be identified using a bone density scan.  If you are 84 years of age or older, or if you are at risk for osteoporosis and fractures, ask your health care provider if you should be screened.  Ask your health care provider whether you should take a calcium or vitamin D supplement to lower your risk for osteoporosis.  Menopause may have certain physical symptoms and risks.  Hormone replacement therapy may reduce some of these symptoms and risks. Talk to your health care provider about whether hormone replacement therapy is right for you.  HOME CARE INSTRUCTIONS   Schedule regular health, dental, and eye exams.  Stay current with your immunizations.   Do not use any tobacco products including cigarettes, chewing tobacco, or electronic cigarettes.  If  you are pregnant, do not drink alcohol.  If you are breastfeeding, limit how much and how often you drink alcohol.  Limit alcohol intake to no more than 1 drink per day for nonpregnant women. One drink equals 12 ounces of beer, 5 ounces of wine, or 1 ounces of hard liquor.  Do not use street drugs.  Do not share needles.  Ask your health care provider for help if you need support or information about quitting drugs.  Tell your health care provider if you often feel depressed.  Tell your health care provider if you have ever been abused or do not feel safe at home.   This information is not intended to replace advice given to you by your health care provider. Make sure you discuss any questions you have with your health care provider.   Document Released: 02/21/2011 Document Revised: 08/29/2014 Document Reviewed: 07/10/2013 Elsevier Interactive Patient Education [employer].

## 2016-04-18 NOTE — Assessment & Plan Note (Signed)
Check TSH 

## 2016-04-18 NOTE — Assessment & Plan Note (Signed)
Good for whole season

## 2016-04-18 NOTE — Assessment & Plan Note (Signed)
Three servings of calcium a day; weight-bearing exercise, avoid fall risk; DEXA Aug 2018

## 2016-04-18 NOTE — Assessment & Plan Note (Signed)
USPSTF grade A and B recommendations reviewed with patient; age-appropriate recommendations, preventive care, screening tests, etc discussed and encouraged; healthy living encouraged; see AVS for patient education given to patient  

## 2016-04-18 NOTE — Assessment & Plan Note (Signed)
Chest CT 

## 2016-04-18 NOTE — Progress Notes (Signed)
Patient ID: [name], female   DOB: 16-Dec-1950, 65 y.o.   MRN: [id number]   Subjective:   Kathy Gregory is a 65 y.o. female here for a complete physical exam  Patient is new to me; his usual provider is out of the office for an extended time  Interim issues since last visit: no medical excitement  USPSTF grade A and B recommendations Alcohol: maybe one drink a week, social and occasion Depression:  Depression screen [facility] 2/9 04/18/2016 04/13/2015  Decreased Interest 0 0  Down, Depressed, Hopeless 1 0  PHQ - 2 Score 1 0   Tetanus and pneumonia vaccines UTD; will get flu shot today Hypertension: controlled Obesity: check TSH Tobacco use: current smoker; she knows she needs to quit; quit for 2 months once HIV, hep B, hep C: declined HIV, okay for hep C STD testing and prevention (chl/gon/syphilis):  Lipids: check today Glucose: check today Colorectal cancer: Sept 2016 Breast cancer: Aug 2016 BRCA gene screening: no breast or ovarian in 1st deg Intimate partner violence: no Cervical cancer screening: UTD Lung cancer: ordered CT Osteoporosis: height decrease Fall prevention/vitamin D: discussed, vit D with calcium AAA: n/a Aspirin: taking 81 mg Diet: typical American; encouraged Exercise: dance-ercise Skin cancer: no worrisome moles  Past Medical History:  Diagnosis Date  . Anxiety   . Arthritis    hips, collarbone  . Depression   . Hyperlipidemia   . Migraines    4-5 per year  . Scoliosis    mild  . Vertigo    2-3x/yr   Past Surgical History:  Procedure Laterality Date  . COLONOSCOPY  2006  . COLONOSCOPY WITH PROPOFOL N/A 05/15/2015   Procedure: COLONOSCOPY WITH PROPOFOL;  Surgeon: Lucilla Lame, MD;  Location: [place];  Service: Endoscopy;  Laterality: N/A;  . POLYPECTOMY  05/15/2015   Procedure: POLYPECTOMY;  Surgeon: Lucilla Lame, MD;  Location: [place];  Service: Endoscopy;;   Family History  Problem Relation Age of Onset  .  Heart disease Mother   . Heart disease Father   . Breast cancer Paternal Grandmother    Social History  Substance Use Topics  . Smoking status: Current Every Day Smoker    Packs/day: 0.75    Years: 40.00    Types: Cigarettes  . Smokeless tobacco: Not on file  . Alcohol use 0.6 oz/week    1 Glasses of wine per week   Review of Systems  Objective:   Vitals:   04/18/16 0841  BP: 126/78  Pulse: 99  Resp: 14  Temp: 98.3 F (36.8 C)  TempSrc: Oral  SpO2: 96%  Weight: 220 lb (99.8 kg)  Height: 5' 7.5" (1.715 m)   Body mass index is 33.95 kg/m. Wt Readings from Last 3 Encounters:  04/18/16 220 lb (99.8 kg)  05/15/15 202 lb (91.6 kg)  04/13/15 205 lb 5 oz (93.1 kg)   Physical Exam  Constitutional: She appears well-developed and well-nourished.  HENT:  Head: Normocephalic and atraumatic.  Right Ear: Hearing, tympanic membrane, external ear and ear canal normal.  Left Ear: Hearing, tympanic membrane, external ear and ear canal normal.  Eyes: Conjunctivae and EOM are normal. Right eye exhibits no hordeolum. Left eye exhibits no hordeolum. No scleral icterus.  Neck: Carotid bruit is not present. No thyromegaly present.  Cardiovascular: Normal rate, regular rhythm, S1 normal, S2 normal and normal heart sounds.   No extrasystoles are present.  Pulmonary/Chest: Effort normal and breath sounds normal. No respiratory distress.  Abdominal: Soft. Normal appearance and bowel sounds are normal. She exhibits no distension, no abdominal bruit, no pulsatile midline mass and no mass. There is no hepatosplenomegaly. There is no tenderness. No hernia.  Musculoskeletal: Normal range of motion. She exhibits no edema.  Lymphadenopathy:       Head (right side): No submandibular adenopathy present.       Head (left side): No submandibular adenopathy present.    She has no cervical adenopathy.    She has no axillary adenopathy.  Neurological: She is alert. She displays no tremor. No cranial nerve  deficit. She exhibits normal muscle tone. Gait normal.  Reflex Scores:      Patellar reflexes are 2+ on the right side and 2+ on the left side. Skin: Skin is warm and dry. No bruising and no ecchymosis noted. No cyanosis. No pallor.  Psychiatric: Her speech is normal and behavior is normal. Thought content normal. Her mood appears not anxious. She does not exhibit a depressed mood.    Assessment/Plan:   Problem List Items Addressed This Visit      Musculoskeletal and Integument   Osteopenia    Three servings of calcium a day; weight-bearing exercise, avoid fall risk; DEXA Aug 2018        Other   Preventative health care - Primary    USPSTF grade A and B recommendations reviewed with patient; age-appropriate recommendations, preventive care, screening tests, etc discussed and encouraged; healthy living encouraged; see AVS for patient education given to patient      Relevant Orders   CBC with Differential/Platelet (Completed)   Lipid panel (Completed)   COMPLETE METABOLIC PANEL WITH GFR (Completed)   TSH (Completed)   Needs flu shot    Good for whole season      Relevant Orders   Flu Vaccine QUAD 36+ mos PF IM (Fluarix & Fluzone Quad PF) (Completed)   Need for hepatitis C screening test    Discussed one-time hep C screening recommendation for individuals born between 1945-1965 per USPSTF guidelines; patient agrees with testing; Hep C Ab ordered      Relevant Orders   Hepatitis C Antibody (Completed)   Current tobacco use    Chest CT      Relevant Orders   CT CHEST LUNG CA SCREEN LOW DOSE W/O CM   Breast cancer screening    Ordered mammo      Relevant Orders   MM DIGITAL SCREENING BILATERAL   Abnormal weight gain    Check TSH       Other Visit Diagnoses   None.      No orders of the defined types were placed in this encounter.  Orders Placed This Encounter  Procedures  . MM DIGITAL SCREENING BILATERAL    Order Specific Question:   Reason for Exam  (SYMPTOM  OR DIAGNOSIS REQUIRED)    Answer:   screening    Order Specific Question:   Preferred imaging location?    Answer:   [facility]  . CT CHEST LUNG CA SCREEN LOW DOSE W/O CM    Order Specific Question:   Reason for Exam (SYMPTOM  OR DIAGNOSIS REQUIRED)    Answer:   smoker    Order Specific Question:   Preferred Imaging Location?    Answer:   [facility]  . Flu Vaccine QUAD 36+ mos PF IM (Fluarix & Fluzone Quad PF)  . CBC with Differential/Platelet  . Lipid panel  . COMPLETE METABOLIC PANEL WITH GFR  .  TSH  . Hepatitis C Antibody    Follow up plan: Return in about 1 year (around 04/18/2017) for complete physical, 3 months for cholesterol, weight.  An after-visit summary was printed and given to the patient at [place].  Please see the patient instructions which may contain other information and recommendations beyond what is mentioned above in the assessment and plan.

## 2016-04-18 NOTE — Assessment & Plan Note (Signed)
Discussed one-time hep C screening recommendation for individuals born between 1945-1965 per USPSTF guidelines; patient agrees with testing; Hep C Ab ordered 

## 2016-04-27 ENCOUNTER — Telehealth: Payer: Self-pay | Admitting: *Deleted

## 2016-04-27 ENCOUNTER — Telehealth: Payer: Self-pay | Admitting: Family Medicine

## 2016-04-27 NOTE — Telephone Encounter (Signed)
Received referral for initial lung cancer screening scan. Contacted patient and obtained smoking history,(current, 30pack year) as well as answering questions related to screening process. Patient denies signs of lung cancer such as weight loss or hemoptysis. Patient denies comorbidity that would prevent curative treatment if lung cancer were found. Patient is tentatively scheduled for shared decision making visit and CT scan on 05/03/16 at 1:30pm, pending insurance approval from business office.

## 2016-04-27 NOTE — Telephone Encounter (Signed)
Pt notified form will be fixed

## 2016-05-02 ENCOUNTER — Ambulatory Visit
Admission: RE | Admit: 2016-05-02 | Discharge: 2016-05-02 | Disposition: A | Discharge status: Home / Self Care | Payer: BLUE CROSS/BLUE SHIELD | Source: Ambulatory Visit | Attending: Family Medicine | Admitting: Family Medicine

## 2016-05-02 ENCOUNTER — Other Ambulatory Visit: Payer: Self-pay | Admitting: *Deleted

## 2016-05-02 DIAGNOSIS — Z87891 Personal history of nicotine dependence: Secondary | ICD-10-CM

## 2016-05-02 DIAGNOSIS — Z1231 Encounter for screening mammogram for malignant neoplasm of breast: Secondary | ICD-10-CM | POA: Insufficient documentation

## 2016-05-03 ENCOUNTER — Ambulatory Visit
Admission: RE | Admit: 2016-05-03 | Discharge: 2016-05-03 | Disposition: A | Discharge status: Home / Self Care | Payer: BLUE CROSS/BLUE SHIELD | Source: Ambulatory Visit | Attending: Oncology | Admitting: Oncology

## 2016-05-03 ENCOUNTER — Inpatient Hospital Stay: Payer: BLUE CROSS/BLUE SHIELD | Attending: Oncology | Admitting: Oncology

## 2016-05-03 DIAGNOSIS — I700 Atherosclerosis of aorta: Secondary | ICD-10-CM | POA: Diagnosis not present

## 2016-05-03 DIAGNOSIS — I2510 Atherosclerotic heart disease of native coronary artery without angina pectoris: Secondary | ICD-10-CM | POA: Insufficient documentation

## 2016-05-03 DIAGNOSIS — J432 Centrilobular emphysema: Secondary | ICD-10-CM | POA: Diagnosis not present

## 2016-05-03 DIAGNOSIS — Z87891 Personal history of nicotine dependence: Secondary | ICD-10-CM

## 2016-05-03 DIAGNOSIS — Z122 Encounter for screening for malignant neoplasm of respiratory organs: Secondary | ICD-10-CM | POA: Diagnosis not present

## 2016-05-03 DIAGNOSIS — F17210 Nicotine dependence, cigarettes, uncomplicated: Secondary | ICD-10-CM | POA: Diagnosis not present

## 2016-05-03 NOTE — Progress Notes (Signed)
In accordance with CMS guidelines, patient has met eligibility criteria including age, absence of signs or symptoms of lung cancer.  Social History  Substance Use Topics  . Smoking status: Current Every Day Smoker    Packs/day: 0.75    Years: 40.00    Types: Cigarettes  . Smokeless tobacco: Not on file  . Alcohol use 0.6 oz/week    1 Glasses of wine per week     A shared decision-making session was conducted prior to the performance of CT scan. This includes one or more decision aids, includes benefits and harms of screening, follow-up diagnostic testing, over-diagnosis, false positive rate, and total radiation exposure.  Counseling on the importance of adherence to annual lung cancer LDCT screening, impact of co-morbidities, and ability or willingness to undergo diagnosis and treatment is imperative for compliance of the program.  Counseling on the importance of continued smoking cessation for former smokers; the importance of smoking cessation for current smokers, and information about tobacco cessation interventions have been given to patient including [place] and [facility] quit [place] programs.  Written order for lung cancer screening with LDCT has been given to the patient and any and all questions have been answered to the best of my abilities.   Yearly follow up will be coordinated by Burgess Estelle, Thoracic Navigator.

## 2016-05-05 ENCOUNTER — Other Ambulatory Visit: Payer: Self-pay | Admitting: Family Medicine

## 2016-05-05 ENCOUNTER — Telehealth: Payer: Self-pay | Admitting: *Deleted

## 2016-05-05 DIAGNOSIS — I2510 Atherosclerotic heart disease of native coronary artery without angina pectoris: Secondary | ICD-10-CM

## 2016-05-05 DIAGNOSIS — J432 Centrilobular emphysema: Secondary | ICD-10-CM

## 2016-05-05 DIAGNOSIS — I700 Atherosclerosis of aorta: Secondary | ICD-10-CM

## 2016-05-05 NOTE — Telephone Encounter (Signed)
Reviewed last sgpt and lipids; rx approved 

## 2016-05-05 NOTE — Telephone Encounter (Signed)
Voicemail left for patient to call back and be notified of LDCT lung cancer screening results with recommendation for 12 month follow up imaging. Also will be notified of incidental finding noted below and encouraged to discuss further with PCP. Results will be sent to PCP as well.   IMPRESSION: 1. Lung-RADS Category 2S, benign appearance or behavior. Continue annual screening with low-dose chest CT without contrast in 12 months. 2. The "S" modifier above refers to potentially clinically significant non lung cancer related findings. Specifically, 1 vessel coronary atherosclerosis. 3. Aortic atherosclerosis. 4. Mild centrilobular emphysema.

## 2016-05-23 DIAGNOSIS — I2510 Atherosclerotic heart disease of native coronary artery without angina pectoris: Secondary | ICD-10-CM | POA: Insufficient documentation

## 2016-05-23 DIAGNOSIS — J432 Centrilobular emphysema: Secondary | ICD-10-CM | POA: Insufficient documentation

## 2016-05-23 DIAGNOSIS — I700 Atherosclerosis of aorta: Secondary | ICD-10-CM | POA: Insufficient documentation

## 2016-05-23 NOTE — Telephone Encounter (Signed)
I have not heard from patient, so I reached out to her; left detailed msg, asking her to schedule an appt to come in to discuss her scan findings

## 2016-07-20 ENCOUNTER — Encounter: Payer: Self-pay | Admitting: Family Medicine

## 2016-07-20 ENCOUNTER — Ambulatory Visit (INDEPENDENT_AMBULATORY_CARE_PROVIDER_SITE_OTHER): Payer: BLUE CROSS/BLUE SHIELD | Admitting: Family Medicine

## 2016-07-20 DIAGNOSIS — I2510 Atherosclerotic heart disease of native coronary artery without angina pectoris: Secondary | ICD-10-CM

## 2016-07-20 DIAGNOSIS — R0683 Snoring: Secondary | ICD-10-CM

## 2016-07-20 DIAGNOSIS — Z7189 Other specified counseling: Secondary | ICD-10-CM | POA: Diagnosis not present

## 2016-07-20 DIAGNOSIS — R718 Other abnormality of red blood cells: Secondary | ICD-10-CM | POA: Insufficient documentation

## 2016-07-20 DIAGNOSIS — J432 Centrilobular emphysema: Secondary | ICD-10-CM

## 2016-07-20 DIAGNOSIS — E782 Mixed hyperlipidemia: Secondary | ICD-10-CM

## 2016-07-20 DIAGNOSIS — R011 Cardiac murmur, unspecified: Secondary | ICD-10-CM | POA: Diagnosis not present

## 2016-07-20 DIAGNOSIS — I700 Atherosclerosis of aorta: Secondary | ICD-10-CM

## 2016-07-20 DIAGNOSIS — Z7185 Encounter for immunization safety counseling: Secondary | ICD-10-CM

## 2016-07-20 MED ORDER — CLONAZEPAM 0.5 MG PO TABS: 0.2500 mg | ORAL_TABLET | Freq: Two times a day (BID) | ORAL | 0 refills | Status: DC | PRN

## 2016-07-20 NOTE — Assessment & Plan Note (Signed)
Discussed findings with patient; urged her to quit smoking

## 2016-07-20 NOTE — Assessment & Plan Note (Signed)
Refer to pulm for sleep study

## 2016-07-20 NOTE — Assessment & Plan Note (Signed)
Refer to cardiologist for evaluation; goal LDL under 70; reasons to call 911, discussed presentation of chest pain in women; take aspirin daily 325 mg (already on that dose)

## 2016-07-20 NOTE — Assessment & Plan Note (Signed)
Discussed with patient; goal LDL now less than 70

## 2016-07-20 NOTE — Progress Notes (Signed)
BP 122/84   Pulse 74   Temp 99.3 F (37.4 C) (Oral)   Resp 14   Wt 221 lb (100.2 kg)   SpO2 94%   BMI 33.11 kg/m    Subjective:    Patient ID: Kathy Gregory, female    DOB: May 06, 1951, 66 y.o.   MRN: [id number]  HPI: Kathy Gregory is a 65 y.o. female  Chief Complaint  Patient presents with  . Follow-up   Patient was seen in August for her physical exam and is here now for follow-up  She went to [facility] and needs Rx for shingles vaccine Already chicken pox; no immunosuppressants Does not live with anyone who is on immunosuppressants She has a new granddaughter; 39 months old; maybe sees her weekly  On no meds for BP; father was close to 7, mother was 37; aunt lived to be 34  High cholesterol; on statin; no problems on that whatsoever; sometimes she forgets to take it; total chol 161, TG 85, HDL 66, LDL 78 in August  She is a smoker; had lung cancer screening CT Found t have CAD; pharmacist told her to take 325 mg daily; no bleeding -------------------------------------- CLINICAL DATA:  66 year old asymptomatic female current smoker with 30 pack-year smoking history.  EXAM: CT CHEST WITHOUT CONTRAST LOW-DOSE FOR LUNG CANCER SCREENING  TECHNIQUE: Multidetector CT imaging of the chest was performed following the standard protocol without IV contrast.  COMPARISON:  10/13/2014 chest radiograph.  FINDINGS: Cardiovascular: Normal heart size. No significant pericardial fluid/thickening. Left anterior descending coronary atherosclerosis . Atherosclerotic nonaneurysmal thoracic aorta. Normal caliber pulmonary arteries.  Mediastinum/Nodes: No discrete thyroid nodules. Unremarkable esophagus. No pathologically enlarged axillary, mediastinal or gross hilar lymph nodes, noting limited sensitivity for the detection of hilar adenopathy on this noncontrast study.  Lungs/Pleura: No pneumothorax. No pleural effusion. Mild centrilobular emphysema. There are tiny pulmonary  nodules in the upper lobes measuring up to 3.2 mm in volume derived mean diameter in the subpleural left upper lobe (series 3/ image 61).  Upper abdomen: Unremarkable.  Musculoskeletal: No aggressive appearing focal osseous lesions. Mild thoracic spondylosis .  IMPRESSION: 1. Lung-RADS Category 2S, benign appearance or behavior. Continue annual screening with low-dose chest CT without contrast in 12 months. 2. The "S" modifier above refers to potentially clinically significant non lung cancer related findings. Specifically, 1 vessel coronary atherosclerosis. 3. Aortic atherosclerosis. 4. Mild centrilobular emphysema.   Electronically Signed   By: Ilona Sorrel M.D.   On: 05/03/2016 16:39 ------------------------------------------  She has emphysema on her chest CT; smoking and wishes she could quit; has program through work  She has obesity; would like to 150 to 160 pounds; used to walk and has a treadmill; does yardwork, but no formal exercise; has to stop and takes her time, paces herself  Elevated H/H; she does snores; mouth breather; mother had a CPAP machine, father might have had sleep apnea and machine too; not really ready to hit the ground running when she wakes up in the morning; falls asleep in the chair at night sometimes  She has anxiety attacks and has clonazepam on her med list; first one was 10 years ago; she only takes that as needed; she has not had a Rx since 2015; rare rare use, but if she has an anxiety attack, just takes a half of a pill; it feels like her throat is closing up and can't do anything, like she could run circles around the house, can't function; so infrequent now; her current  pills are old, just a security blanket; occasional glass of wine but knows to not to take with anxiety medicine  Depression screen [facility] 2/9 07/20/2016 04/18/2016 04/13/2015  Decreased Interest 0 0 0  Down, Depressed, Hopeless 0 1 0  PHQ - 2 Score 0 1 0   Relevant past  medical, surgical, family and social history reviewed Past Medical History:  Diagnosis Date  . Anxiety   . Arthritis    hips, collarbone  . Depression   . Heart murmur 07/24/2016  . Hyperlipidemia   . Migraines    4-5 per year  . Scoliosis    mild  . Vertigo    2-3x/yr   Past Surgical History:  Procedure Laterality Date  . BREAST CYST ASPIRATION Left   . COLONOSCOPY  2006  . COLONOSCOPY WITH PROPOFOL N/A 05/15/2015   Procedure: COLONOSCOPY WITH PROPOFOL;  Surgeon: Lucilla Lame, MD;  Location: [place];  Service: Endoscopy;  Laterality: N/A;  . POLYPECTOMY  05/15/2015   Procedure: POLYPECTOMY;  Surgeon: Lucilla Lame, MD;  Location: [place];  Service: Endoscopy;;   Family History  Problem Relation Age of Onset  . Heart disease Mother   . Heart disease Father   . Breast cancer Paternal Grandmother   MD note: patient's father had a CABG and then had a stroke during surgery; recovered fairly well after surgery  Social History  Substance Use Topics  . Smoking status: Current Every Day Smoker    Packs/day: 0.50    Years: 40.00    Types: Cigarettes  . Smokeless tobacco: Never Used  . Alcohol use 0.6 oz/week    1 Glasses of wine per week   Interim medical history since last visit reviewed. Allergies and medications reviewed  Review of Systems Per HPI unless specifically indicated above     Objective:    BP 122/84   Pulse 74   Temp 99.3 F (37.4 C) (Oral)   Resp 14   Wt 221 lb (100.2 kg)   SpO2 94%   BMI 33.11 kg/m   Wt Readings from Last 3 Encounters:  07/20/16 221 lb (100.2 kg)  05/03/16 220 lb (99.8 kg)  04/18/16 220 lb (99.8 kg)    Physical Exam  Constitutional: She appears well-developed and well-nourished. No distress.  HENT:  Head: Normocephalic and atraumatic.  Eyes: EOM are normal. No scleral icterus.  Neck: No thyromegaly present.  Cardiovascular: Normal rate and regular rhythm.   Murmur (2/6 systolic murmur over RUSB)  heard. Pulmonary/Chest: Effort normal and breath sounds normal. No respiratory distress. She has no wheezes.  Abdominal: Soft. Bowel sounds are normal. She exhibits no distension.  Musculoskeletal: Normal range of motion. She exhibits no edema.  Neurological: She is alert. She exhibits normal muscle tone.  Skin: Skin is warm and dry. She is not diaphoretic. No pallor.  Psychiatric: She has a normal mood and affect. Her behavior is normal. Judgment and thought content normal.   Results for orders placed or performed in visit on 04/18/16  CBC with Differential/Platelet  Result Value Ref Range   WBC 7.6 3.8 - 10.8 K/uL   RBC 4.94 3.80 - 5.10 MIL/uL   Hemoglobin 15.7 (H) 11.7 - 15.5 g/dL   HCT 46.3 (H) 35.0 - 45.0 %   MCV 93.7 80.0 - 100.0 fL   MCH 31.8 27.0 - 33.0 pg   MCHC 33.9 32.0 - 36.0 g/dL   RDW 13.0 11.0 - 15.0 %   Platelets 312 140 - 400 K/uL  MPV 11.1 7.5 - 12.5 fL   Neutro Abs 4,788 1,500 - 7,800 cells/uL   Lymphs Abs 1,748 850 - 3,900 cells/uL   Monocytes Absolute 760 200 - 950 cells/uL   Eosinophils Absolute 228 15 - 500 cells/uL   Basophils Absolute 76 0 - 200 cells/uL   Neutrophils Relative % 63 %   Lymphocytes Relative 23 %   Monocytes Relative 10 %   Eosinophils Relative 3 %   Basophils Relative 1 %   Smear Review Criteria for review not met   Lipid panel  Result Value Ref Range   Cholesterol 161 125 - 200 mg/dL   Triglycerides 85 <150 mg/dL   HDL 66 >=46 mg/dL   Total CHOL/HDL Ratio 2.4 <=5.0 Ratio   VLDL 17 <30 mg/dL   LDL Cholesterol 78 <130 mg/dL  COMPLETE METABOLIC PANEL WITH GFR  Result Value Ref Range   Sodium 142 135 - 146 mmol/L   Potassium 4.5 3.5 - 5.3 mmol/L   Chloride 105 98 - 110 mmol/L   CO2 27 20 - 31 mmol/L   Glucose, Bld 90 65 - 99 mg/dL   BUN 18 7 - 25 mg/dL   Creat 0.90 0.50 - 0.99 mg/dL   Total Bilirubin 0.4 0.2 - 1.2 mg/dL   Alkaline Phosphatase 99 33 - 130 U/L   AST 17 10 - 35 U/L   ALT 18 6 - 29 U/L   Total Protein 7.4 6.1 -  8.1 g/dL   Albumin 4.3 3.6 - 5.1 g/dL   Calcium 10.0 8.6 - 10.4 mg/dL   GFR, Est African American 79 >=60 mL/min   GFR, Est Non African American 68 >=60 mL/min  TSH  Result Value Ref Range   TSH 1.91 mIU/L  Hepatitis C Antibody  Result Value Ref Range   HCV Ab NEGATIVE NEGATIVE      Assessment & Plan:   Problem List Items Addressed This Visit      Cardiovascular and Mediastinum   Coronary artery disease (Chronic)    Refer to cardiologist for evaluation; goal LDL under 70; reasons to call 911, discussed presentation of chest pain in women; take aspirin daily 325 mg (already on that dose)      Relevant Orders   Ambulatory referral to Cardiology   Aortic atherosclerosis (HCC) (Chronic)    Discussed with patient; goal LDL now less than 70      Relevant Orders   Ambulatory referral to Cardiology     Respiratory   Centrilobular emphysema (HCC) (Chronic)    Discussed findings with patient; urged her to quit smoking        Other   Vaccine counseling    We discussed the shingles vaccine, risk of shedding live virus for up to 8 weeks; she has decided to wait until granddaughter 52 year old and gets her own chicken pox vaccine      Snoring    Refer to pulm for evaluation of possible OSA      Relevant Orders   Ambulatory referral to Pulmonology   HLD (hyperlipidemia) (Chronic)    New goal is less than 70; whole grains, oatmeal, Cheerios, continue statin; recheck labs in 3 months      Heart murmur    Noted on exam today; referring to cardiologist for her coronary artery disease; may get echo, but will defer ordering that to cardiologist      Elevated hematocrit    Refer to pulm for sleep study      Relevant  Orders   Ambulatory referral to Pulmonology      Follow up plan: Return in about 6 months (around 01/17/2017) for follow-up with Dr. Sanda Klein, fasting labs only in 3 months.  An after-visit summary was printed and given to the patient at [place].  Please see the  patient instructions which may contain other information and recommendations beyond what is mentioned above in the assessment and plan.  Meds ordered this encounter  Medications  . clonazePAM (KLONOPIN) 0.5 MG tablet    Sig: Take 0.5 tablets (0.25 mg total) by mouth 2 (two) times daily as needed for anxiety.    Dispense:  5 tablet    Refill:  0    Orders Placed This Encounter  Procedures  . Ambulatory referral to Pulmonology  . Ambulatory referral to Cardiology

## 2016-07-20 NOTE — Assessment & Plan Note (Signed)
New goal is less than 70; whole grains, oatmeal, Cheerios, continue statin; recheck labs in 3 months

## 2016-07-20 NOTE — Patient Instructions (Addendum)
I'll suggest that you postpone your shingles vaccine until your new grandchild is a year of age and gets her chicken pox vaccine Try to limit saturated fats in your diet (bologna, hot dogs, barbeque, cheeseburgers, hamburgers, steak, bacon, sausage, cheese, etc.) and get more fresh fruits, vegetables, and whole grains I do encourage you to quit smoking Call [phone] to sign up for smoking cessation classes You can call 1-800-QUIT-NOW to talk with a smoking cessation coach Return in 3 months for next fasting labs (labs only) We'll refer you to the cardiologist and the pulmonologist Use the anxiety medicine only when needed for truly stormy moments; never mix with alcohol Check out the information at familydoctor.org entitled "Nutrition for Weight Loss: What You Need to Know about Fad Diets" Try to lose between 1-2 pounds per week by taking in fewer calories and burning off more calories You can succeed by limiting portions, limiting foods dense in calories and fat, becoming more active, and drinking 8 glasses of water a day (64 ounces) Don't skip meals, especially breakfast, as skipping meals may alter your metabolism Do not use over-the-counter weight loss pills or gimmicks that claim rapid weight loss A healthy BMI (or body mass index) is between 18.5 and 24.9 You can calculate your ideal BMI at the [place] website ClubMonetize[employer]fr Return in 6 months for regular f/u

## 2016-07-20 NOTE — Assessment & Plan Note (Signed)
Refer to pulm for evaluation of possible OSA

## 2016-07-24 ENCOUNTER — Encounter: Payer: Self-pay | Admitting: Family Medicine

## 2016-07-24 DIAGNOSIS — Z7189 Other specified counseling: Secondary | ICD-10-CM

## 2016-07-24 DIAGNOSIS — R011 Cardiac murmur, unspecified: Secondary | ICD-10-CM

## 2016-07-24 HISTORY — DX: Cardiac murmur, unspecified: R01.1

## 2016-07-24 NOTE — Assessment & Plan Note (Signed)
Noted on exam today; referring to cardiologist for her coronary artery disease; may get echo, but will defer ordering that to cardiologist

## 2016-07-24 NOTE — Assessment & Plan Note (Signed)
We discussed the shingles vaccine, risk of shedding live virus for up to 8 weeks; she has decided to wait until granddaughter 64 year old and gets her own chicken pox vaccine

## 2016-07-26 ENCOUNTER — Ambulatory Visit (INDEPENDENT_AMBULATORY_CARE_PROVIDER_SITE_OTHER): Payer: BLUE CROSS/BLUE SHIELD | Admitting: Internal Medicine

## 2016-07-26 ENCOUNTER — Encounter: Payer: Self-pay | Admitting: Internal Medicine

## 2016-07-26 VITALS — BP 126/84 | HR 74 | Ht 68.0 in | Wt 215.5 lb

## 2016-07-26 DIAGNOSIS — R002 Palpitations: Secondary | ICD-10-CM

## 2016-07-26 DIAGNOSIS — I2510 Atherosclerotic heart disease of native coronary artery without angina pectoris: Secondary | ICD-10-CM | POA: Diagnosis not present

## 2016-07-26 DIAGNOSIS — E785 Hyperlipidemia, unspecified: Secondary | ICD-10-CM | POA: Diagnosis not present

## 2016-07-26 MED ORDER — ASPIRIN EC 81 MG PO TBEC: 81.0000 mg | DELAYED_RELEASE_TABLET | Freq: Every day | ORAL | 3 refills | Status: DC

## 2016-07-26 MED ORDER — ATORVASTATIN CALCIUM 20 MG PO TABS: 20.0000 mg | ORAL_TABLET | Freq: Every day | ORAL | 3 refills | Status: DC

## 2016-07-26 NOTE — Patient Instructions (Signed)
Medication Instructions:  Your physician has recommended you make the following change in your medication:  1- DECREASE Aspirin to 81 mg by mouth daily. 2- INCREASE Atorvastatin to 20 mg by mouth once a day in the evening.   Follow-Up: Your physician recommends that you schedule a follow-up appointment in: 3 MONTHS WITH DR END.   If you need a refill on your cardiac medications before your next appointment, please call your pharmacy.

## 2016-07-26 NOTE — Progress Notes (Signed)
New Outpatient Visit Date: 07/26/2016  Referring Provider: Arnetha Courser, MD [address] [place], [place] [address]  Chief Complaint: Coronary artery calcification noted on chest CT  HPI:  Kathy Gregory is a 65 y.o. year-old female with history of hyperlipidemia, tobacco use, depression/anxiety, arthritis, and migraine headaches, who has been referred by Dr. Sanda Klein for evaluation of incidentally noted coronary artery calcification on lung cancer screening CT. the patient denies a history of heart disease (with the exception of a heart murmur) or prior heart testing. She has not had any chest pain. She notes exertional dyspnea that has been present for years, though it may have been slightly worse over the last year since her husband died. She does not exercise regularly owing to chronic hip pain. However, she is able to work in the yard and carry out all her normal activities without any significant limitations. She denies orthopnea, PND, and leg edema. She is planning to undergo pulmonary evaluation, including for possible sleep apnea, next week due to a history of snoring as well as elevated hemoglobin on recent blood check.  The patient has a history of intermittent vertigo that seems positional. She has not had any lightheadedness. She notes rare "hesitation" in her heartbeat when lying in bed at night. There are no associated symptoms.  Kathy Gregory is currently still smoking about half a pack per day but is in the process of trying to quit.  --------------------------------------------------------------------------------------------------  Cardiovascular History & Procedures: Cardiovascular Problems:  Coronary artery calcification  Risk Factors:  Coronary artery calcification, hyperlipidemia, and tobacco use  Cath/PCI:  None  CV Surgery:  None  EP Procedures and Devices:  None  Non-Invasive Evaluation(s):  None  Recent CV Pertinent Labs: Lab Results  Component  Value Date   CHOL 161 04/18/2016   HDL 66 04/18/2016   LDLCALC 78 04/18/2016   TRIG 85 04/18/2016   CHOLHDL 2.4 04/18/2016   K 4.5 04/18/2016   BUN 18 04/18/2016   CREATININE 0.90 04/18/2016   --------------------------------------------------------------------------------------------------  Past Medical History:  Diagnosis Date  . Anxiety   . Arthritis    hips, collarbone  . Depression   . Heart murmur 07/24/2016  . Hyperlipidemia   . Migraines    4-5 per year  . Scoliosis    mild  . Vertigo    2-3x/yr    Past Surgical History:  Procedure Laterality Date  . BREAST CYST ASPIRATION Left   . COLONOSCOPY  2006  . COLONOSCOPY WITH PROPOFOL N/A 05/15/2015   Procedure: COLONOSCOPY WITH PROPOFOL;  Surgeon: Lucilla Lame, MD;  Location: [facility];  Service: Endoscopy;  Laterality: N/A;  . POLYPECTOMY  05/15/2015   Procedure: POLYPECTOMY;  Surgeon: Lucilla Lame, MD;  Location: [facility];  Service: Endoscopy;;    Outpatient Encounter Prescriptions as of 07/26/2016  Medication Sig  . aspirin 325 MG tablet Take 325 mg by mouth daily. PM  . atorvastatin (LIPITOR) 10 MG tablet TAKE 1 TABLET EACH NIGHT AT BEDTIME  . calcium-vitamin D (OSCAL WITH D) 250-125 MG-UNIT per tablet Take 1 tablet by mouth daily.  . clonazePAM (KLONOPIN) 0.5 MG tablet Take 0.5 tablets (0.25 mg total) by mouth 2 (two) times daily as needed for anxiety.  . Fish Oil-Cholecalciferol (FISH OIL + D3) 1000-1000 MG-UNIT CAPS Take by mouth.  . psyllium (REGULOID) 0.52 G capsule Take 0.52 g by mouth daily.  . traZODone (DESYREL) 50 MG tablet TAKE 1/2 TO 1 TABLET AT BEDTIME AND 1/4 TABLET TWICE  DAILY AS NEEDED   No facility-administered encounter medications on file as of 07/26/2016.     Allergies: Patient has no known allergies.  Social History   Social History  . Marital status: Widowed    Spouse name: N/A  . Number of children: N/A  . Years of education: N/A   Occupational History  . Not on  file.   Social History Main Topics  . Smoking status: Current Every Day Smoker    Packs/day: 0.50    Years: 40.00    Types: Cigarettes  . Smokeless tobacco: Never Used     Comment: Trying to cut back  . Alcohol use 0.6 oz/week    1 Glasses of wine per week  . Drug use: No  . Sexual activity: No   Other Topics Concern  . Not on file   Social History Narrative  . No narrative on file    Family History  Problem Relation Age of Onset  . Hypertension Mother   . Diabetes Mother   . Heart disease Father 36    CABG  . Stroke Father 16    Occurred during CABG  . Breast cancer Paternal Grandmother     Review of Systems: A 12-system review of systems was performed and was negative except as noted in the HPI.  --------------------------------------------------------------------------------------------------  Physical Exam: BP 126/84   Pulse 74   Ht 5\' 8"  (1.727 m)   Wt 215 lb 8 oz (97.8 kg)   BMI 32.77 kg/m   General:  Overweight woman, seated comfortably in the exam room. HEENT: No conjunctival pallor or scleral icterus.  Moist mucous membranes.  OP clear. Neck: Supple without lymphadenopathy, thyromegaly, JVD, or HJR.  No carotid bruit. Lungs: Normal work of breathing.  Clear to auscultation bilaterally without wheezes or crackles. Heart: Regular rate and rhythm without murmurs, rubs, or gallops.  Non-displaced PMI. Abd: Bowel sounds present.  Soft, NT/ND without hepatosplenomegaly Ext: No lower extremity edema.  Radial, PT, and DP pulses are 2+ bilaterally Skin: warm and dry without rash Neuro: CNIII-XII intact.  Strength and fine-touch sensation intact in upper and lower extremities bilaterally. Psych: Normal mood and affect.  EKG:  Normal sinus rhythm without significant abnormalities. No prior tracing is available for comparison.  CTA chest (05/03/16): I have personally reviewed the images. Heart size is normal with focal calcification involving the LAD as well as  the aortic root. Tiny pulmonary nodules measuring up to 3 mm are noted in the upper lung fields. Mild centrilobular emphysema is also present.  Lab Results  Component Value Date   WBC 7.6 04/18/2016   HGB 15.7 (H) 04/18/2016   HCT 46.3 (H) 04/18/2016   MCV 93.7 04/18/2016   PLT 312 04/18/2016    Lab Results  Component Value Date   NA 142 04/18/2016   K 4.5 04/18/2016   CL 105 04/18/2016   CO2 27 04/18/2016   BUN 18 04/18/2016   CREATININE 0.90 04/18/2016   GLUCOSE 90 04/18/2016   ALT 18 04/18/2016    Lab Results  Component Value Date   CHOL 161 04/18/2016   HDL 66 04/18/2016   LDLCALC 78 04/18/2016   TRIG 85 04/18/2016   CHOLHDL 2.4 04/18/2016   --------------------------------------------------------------------------------------------------  ASSESSMENT AND PLAN: Coronary artery calcification Coronary artery calcification predominantly involving the LAD was incidentally noted on recent lung cancer screening chest CT. The patient has some exertional dyspnea that has not worsened acutely and is likely multifactorial. She denies chest pain and other  concerning cardiovascular symptoms. Her risk factors for coronary artery disease include hyperlipidemia and tobacco use. We discussed that coronary artery calcification is a marker for atherosclerosis and cardiovascular risk. Given that she has not had any angina or worsening dyspnea, we have agreed to forego further testing at this time. We will aggressively control her risk factors, including escalation of lipid therapy and lifestyle modifications. I specifically asked the patient to quit smoking. I also encouraged her to lose weight. We will decrease aspirin to 81 mg daily to minimize the risk for GI side effects. If she develops new symptoms, we will proceed with myocardial perfusion stress test.  Hyperlipidemia Most recent LDL in August was 78. In light of her coronary artery calcification and other risk factors, we have agreed to  increase atorvastatin to 20 mg daily with plan to repeat lipid panel in about 3 months.  Palpitations The patient reports a long history of occasional "hesitations" that she notices when lying in bed. There are no worrisome features such as chest pain or lightheadedness that accompany the palpitations. I suspect this most likely represents isolated supraventricular or ventricular ectopy. We will forego further evaluation at this time.  Follow-up: Return to clinic in 3 months.  Nelva Bush, MD 07/26/2016 9:00 AM

## 2016-08-01 ENCOUNTER — Institutional Professional Consult (permissible substitution): Payer: BLUE CROSS/BLUE SHIELD | Admitting: Internal Medicine

## 2016-08-09 ENCOUNTER — Encounter: Payer: Self-pay | Admitting: Internal Medicine

## 2016-08-09 ENCOUNTER — Ambulatory Visit (INDEPENDENT_AMBULATORY_CARE_PROVIDER_SITE_OTHER): Payer: BLUE CROSS/BLUE SHIELD | Admitting: Internal Medicine

## 2016-08-09 VITALS — BP 124/72 | HR 96 | Ht 68.0 in | Wt 218.6 lb

## 2016-08-09 DIAGNOSIS — G4719 Other hypersomnia: Secondary | ICD-10-CM

## 2016-08-09 DIAGNOSIS — Z720 Tobacco use: Secondary | ICD-10-CM

## 2016-08-09 DIAGNOSIS — F411 Generalized anxiety disorder: Secondary | ICD-10-CM

## 2016-08-09 DIAGNOSIS — F17210 Nicotine dependence, cigarettes, uncomplicated: Secondary | ICD-10-CM

## 2016-08-09 NOTE — Patient Instructions (Signed)
--  Quitting smoking is the most important thing that you can do for your health.  --Quitting smoking will have greater affect on your health than any medicine that we can give you.     Sleep Apnea Sleep apnea is disorder that affects a person's sleep. A person with sleep apnea has abnormal pauses in their breathing when they sleep. It is hard for them to get a good sleep. This makes a person tired during the day. It also can lead to other physical problems. There are three types of sleep apnea. One type is when breathing stops for a short time because your airway is blocked (obstructive sleep apnea). Another type is when the brain sometimes fails to give the normal signal to breathe to the muscles that control your breathing (central sleep apnea). The third type is a combination of the other two types. HOME CARE   Take all medicine as told by your doctor.  Avoid alcohol, calming medicines (sedatives), and depressant drugs.  Try to lose weight if you are overweight. Talk to your doctor about a healthy weight goal.  Your doctor may have you use a device that helps to open your airway. It can help you get the air that you need. It is called a positive airway pressure (PAP) device.   MAKE SURE YOU:   Understand these instructions.  Will watch your condition.  Will get help right away if you are not doing well or get worse.  It may take approximately 1 month for you to get used to wearing her CPAP every night.  Be sure to work with your machine to get used to it, be patient, it may take time!

## 2016-08-09 NOTE — Progress Notes (Signed)
[facility] Consultation      Assessment and Plan:  Excessive daytime sleepiness. --Symptoms and signs of obstructive sleep apnea. -Send for sleep study.  Nicotine abuse. --Spent > 3 min in counseling.   Emphysema. -Seen on most recent CT lung cancer screening, per the radiology report. -On my review of the CT scan. This is minimal to nil.  Anxiety and depression. --Controlled.  Date: 08/09/2016  MRN# [id number] Kathy Gregory [birth year]    Kathy Gregory is a 65 y.o. old female female seen in consultation for chief complaint of:    Chief Complaint  Patient presents with  . sleep consult    per Dr. Sanda Klein. no prior sleep study. pt c/o mild daytime sleepiness. pt denies any restless sleep or waking gasping for air.    HPI:   She notes that she has been told that she snores at night, she has been having daytime sleepiness. Her husband passed a year ago, and sometimes has trouble falling asleep.  She had CT lung cancer screening and was told that she had emphysema.  No sleep walking, no cataplexy, no sleep paralysis. She falls asleep easily is sitting in a comfy chair.   Both of her parents had CPAP machines.  She is smoking half ppd. She has not really quit in the past, she thinks that she is not yet ready to  quit, but might be getting ready to start thinking about.   She goes to bed at 11 pm and falls asleep easily. Wakes at   Reviewed CT chest from 05/03/16 some strandy atelectasis, but minimal to no emphysema noted.       PMHX:   Past Medical History:  Diagnosis Date  . Anxiety   . Arthritis    hips, collarbone  . Depression   . Heart murmur 07/24/2016  . Hyperlipidemia   . Migraines    4-5 per year  . Scoliosis    mild  . Vertigo    2-3x/yr   Surgical Hx:  Past Surgical History:  Procedure Laterality Date  . BREAST CYST ASPIRATION Left   . COLONOSCOPY  2006  . COLONOSCOPY WITH PROPOFOL N/A 05/15/2015   Procedure: COLONOSCOPY WITH  PROPOFOL;  Surgeon: Lucilla Lame, MD;  Location: [facility];  Service: Endoscopy;  Laterality: N/A;  . POLYPECTOMY  05/15/2015   Procedure: POLYPECTOMY;  Surgeon: Lucilla Lame, MD;  Location: [facility];  Service: Endoscopy;;   Family Hx:  Family History  Problem Relation Age of Onset  . Hypertension Mother   . Diabetes Mother   . Heart disease Father 71    CABG  . Stroke Father 21    Occurred during CABG  . Breast cancer Paternal Grandmother    Social Hx:   Social History  Substance Use Topics  . Smoking status: Current Every Day Smoker    Packs/day: 0.50    Years: 40.00    Types: Cigarettes  . Smokeless tobacco: Never Used     Comment: Trying to cut back  . Alcohol use 0.6 oz/week    1 Glasses of wine per week   Medication:   Reviewed    Allergies:  Patient has no known allergies.  Review of Systems: Gen:  Denies  fever, sweats, chills HEENT: Denies blurred vision, double vision. bleeds, sore throat Cvc:  No dizziness, chest pain. Resp:   Denies cough or sputum production, shortness of breath Gi: Denies swallowing difficulty, stomach pain. Gu:  Denies bladder incontinence, burning urine  Ext:   No Joint pain, stiffness. Skin: No skin rash,  hives  Endoc:  No polyuria, polydipsia. Psych: No depression, insomnia. Other:  All other systems were reviewed with the patient and were negative other that what is mentioned in the HPI.   Physical Examination:   VS: BP 124/72 (BP Location: Left Arm, Cuff Size: Normal)   Pulse 96   Ht 5\' 8"  (1.727 m)   Wt 99.2 kg (218 lb 9.6 oz)   SpO2 97%   BMI 33.24 kg/m   General Appearance: No distress  Neuro:without focal findings,  speech normal,  HEENT: PERRLA, EOM intact.  [place] 2.  Pulmonary: normal breath sounds, No wheezing.  CardiovascularNormal S1,S2.  No m/r/g.   Abdomen: Benign, Soft, non-tender. Renal:  No costovertebral tenderness  GU:  No performed at this time. Endoc: No evident thyromegaly,  no signs of acromegaly. Skin:   warm, no rashes, no ecchymosis  Extremities: normal, no cyanosis, clubbing.  Other findings:    LABORATORY PANEL:   CBC No results for input(s): WBC, HGB, HCT, PLT in the last 168 hours. ------------------------------------------------------------------------------------------------------------------  Chemistries  No results for input(s): NA, K, CL, CO2, GLUCOSE, BUN, CREATININE, CALCIUM, MG, AST, ALT, ALKPHOS, BILITOT in the last 168 hours.  Invalid input(s): GFRCGP ------------------------------------------------------------------------------------------------------------------  Cardiac Enzymes No results for input(s): TROPONINI in the last 168 hours. ------------------------------------------------------------  RADIOLOGY:  No results found.     Thank  you for the consultation and for allowing [facility] to assist in the care of your patient. Our recommendations are noted above.  Please contact us if we can be of further service.   Marda Stalker, MD.  Board Certified in Internal Medicine, Pulmonary Medicine, [place], and Sleep Medicine.  [facility] Office Number: [phone]  Patricia Pesa, M.D.  Vilinda Boehringer, M.D.  Merton Border, M.D  08/09/2016

## 2016-08-27 ENCOUNTER — Other Ambulatory Visit: Payer: Self-pay | Admitting: Family Medicine

## 2016-08-27 NOTE — Telephone Encounter (Signed)
Request received for atorvastatin 10 mg Chart reviewed Denied Dose was changed to 20 mg in December by cardiologist, Dr. Saunders Revel

## 2016-08-29 ENCOUNTER — Other Ambulatory Visit: Payer: Self-pay

## 2016-08-29 MED ORDER — TRAZODONE HCL 50 MG PO TABS: 50.0000 mg | ORAL_TABLET | Freq: Every evening | ORAL | 5 refills | Status: DC | PRN

## 2016-09-09 DIAGNOSIS — L239 Allergic contact dermatitis, unspecified cause: Secondary | ICD-10-CM | POA: Diagnosis not present

## 2016-09-16 ENCOUNTER — Ambulatory Visit: Payer: BLUE CROSS/BLUE SHIELD | Attending: Internal Medicine

## 2016-09-16 DIAGNOSIS — G4733 Obstructive sleep apnea (adult) (pediatric): Secondary | ICD-10-CM | POA: Diagnosis not present

## 2016-09-16 DIAGNOSIS — R400 Somnolence: Secondary | ICD-10-CM | POA: Diagnosis present

## 2016-09-16 DIAGNOSIS — I2510 Atherosclerotic heart disease of native coronary artery without angina pectoris: Secondary | ICD-10-CM | POA: Diagnosis present

## 2016-09-16 DIAGNOSIS — I700 Atherosclerosis of aorta: Secondary | ICD-10-CM | POA: Diagnosis present

## 2016-09-23 ENCOUNTER — Telehealth: Payer: Self-pay | Admitting: *Deleted

## 2016-09-23 DIAGNOSIS — G4733 Obstructive sleep apnea (adult) (pediatric): Secondary | ICD-10-CM

## 2016-09-23 NOTE — Telephone Encounter (Signed)
-----   Message from Laverle Hobby, MD sent at 09/22/2016  3:40 PM EST ----- Regarding: PSG results.  Sleep study showed mild OSA with AHI of 14  Recommend  Sleep study with CPAP titration

## 2016-09-23 NOTE — Telephone Encounter (Signed)
Pt informed of results. Titration ordered.

## 2016-09-23 NOTE — Telephone Encounter (Signed)
LMOM for pt to return call for results. 

## 2016-09-27 ENCOUNTER — Telehealth: Payer: BLUE CROSS/BLUE SHIELD | Admitting: Nurse Practitioner

## 2016-09-27 DIAGNOSIS — N3000 Acute cystitis without hematuria: Secondary | ICD-10-CM

## 2016-09-27 MED ORDER — CIPROFLOXACIN HCL 500 MG PO TABS: 500.0000 mg | ORAL_TABLET | Freq: Two times a day (BID) | ORAL | 0 refills | Status: DC

## 2016-09-27 NOTE — Progress Notes (Signed)

## 2016-10-07 DIAGNOSIS — L309 Dermatitis, unspecified: Secondary | ICD-10-CM | POA: Diagnosis not present

## 2016-10-20 ENCOUNTER — Other Ambulatory Visit: Payer: Self-pay | Admitting: Family Medicine

## 2016-10-20 ENCOUNTER — Other Ambulatory Visit: Payer: BLUE CROSS/BLUE SHIELD

## 2016-10-20 DIAGNOSIS — I700 Atherosclerosis of aorta: Secondary | ICD-10-CM | POA: Diagnosis not present

## 2016-10-20 DIAGNOSIS — R718 Other abnormality of red blood cells: Secondary | ICD-10-CM

## 2016-10-20 DIAGNOSIS — I2510 Atherosclerotic heart disease of native coronary artery without angina pectoris: Secondary | ICD-10-CM | POA: Diagnosis not present

## 2016-10-20 DIAGNOSIS — E782 Mixed hyperlipidemia: Secondary | ICD-10-CM

## 2016-10-20 DIAGNOSIS — Z5181 Encounter for therapeutic drug level monitoring: Secondary | ICD-10-CM

## 2016-10-20 LAB — CBC WITH DIFFERENTIAL/PLATELET
BASOS PCT: 1 %
Basophils Absolute: 62 cells/uL (ref 0–200)
EOS ABS: 186 {cells}/uL (ref 15–500)
Eosinophils Relative: 3 %
HCT: 44.2 % (ref 35.0–45.0)
Hemoglobin: 14.5 g/dL (ref 11.7–15.5)
Lymphocytes Relative: 26 %
Lymphs Abs: 1612 cells/uL (ref 850–3900)
MCH: 31.4 pg (ref 27.0–33.0)
MCHC: 32.8 g/dL (ref 32.0–36.0)
MCV: 95.7 fL (ref 80.0–100.0)
MONOS PCT: 10 %
MPV: 10.7 fL (ref 7.5–12.5)
Monocytes Absolute: 620 cells/uL (ref 200–950)
NEUTROS ABS: 3720 {cells}/uL (ref 1500–7800)
Neutrophils Relative %: 60 %
PLATELETS: 274 10*3/uL (ref 140–400)
RBC: 4.62 MIL/uL (ref 3.80–5.10)
RDW: 13.1 % (ref 11.0–15.0)
WBC: 6.2 10*3/uL (ref 3.8–10.8)

## 2016-10-20 LAB — COMPLETE METABOLIC PANEL WITH GFR
ALT: 21 U/L (ref 6–29)
AST: 19 U/L (ref 10–35)
Albumin: 3.7 g/dL (ref 3.6–5.1)
Alkaline Phosphatase: 76 U/L (ref 33–130)
BILIRUBIN TOTAL: 0.4 mg/dL (ref 0.2–1.2)
BUN: 12 mg/dL (ref 7–25)
CO2: 29 mmol/L (ref 20–31)
CREATININE: 0.78 mg/dL (ref 0.50–0.99)
Calcium: 9.4 mg/dL (ref 8.6–10.4)
Chloride: 108 mmol/L (ref 98–110)
GFR, Est Non African American: 81 mL/min (ref >=60)
GLUCOSE: 81 mg/dL (ref 65–99)
Potassium: 4.6 mmol/L (ref 3.5–5.3)
SODIUM: 142 mmol/L (ref 135–146)
TOTAL PROTEIN: 6.4 g/dL (ref 6.1–8.1)

## 2016-10-20 LAB — LIPID PANEL
CHOLESTEROL: 127 mg/dL (ref <200)
HDL: 56 mg/dL (ref >50)
LDL Cholesterol: 55 mg/dL (ref <100)
Total CHOL/HDL Ratio: 2.3 Ratio (ref <5.0)
Triglycerides: 82 mg/dL (ref <150)
VLDL: 16 mg/dL (ref <30)

## 2016-10-20 NOTE — Progress Notes (Signed)
Lab orders

## 2016-10-20 NOTE — Assessment & Plan Note (Signed)
Check labs 

## 2016-10-21 ENCOUNTER — Encounter: Payer: Self-pay | Admitting: Internal Medicine

## 2016-10-21 ENCOUNTER — Ambulatory Visit: Payer: BLUE CROSS/BLUE SHIELD | Attending: Internal Medicine

## 2016-10-21 DIAGNOSIS — G4733 Obstructive sleep apnea (adult) (pediatric): Secondary | ICD-10-CM | POA: Insufficient documentation

## 2016-10-26 ENCOUNTER — Ambulatory Visit (INDEPENDENT_AMBULATORY_CARE_PROVIDER_SITE_OTHER): Payer: BLUE CROSS/BLUE SHIELD | Admitting: Internal Medicine

## 2016-10-26 VITALS — BP 104/76 | HR 67 | Ht 67.0 in | Wt 213.2 lb

## 2016-10-26 DIAGNOSIS — E785 Hyperlipidemia, unspecified: Secondary | ICD-10-CM

## 2016-10-26 DIAGNOSIS — I2510 Atherosclerotic heart disease of native coronary artery without angina pectoris: Secondary | ICD-10-CM | POA: Diagnosis not present

## 2016-10-26 DIAGNOSIS — R002 Palpitations: Secondary | ICD-10-CM

## 2016-10-26 DIAGNOSIS — I2584 Coronary atherosclerosis due to calcified coronary lesion: Secondary | ICD-10-CM

## 2016-10-26 NOTE — Patient Instructions (Signed)
Medication Instructions:  Your physician recommends that you continue on your current medications as directed. Please refer to the Current Medication list given to you today.   Labwork: none  Testing/Procedures: none  Follow-Up: Your physician wants you to follow-up in: 12 MONTHS WITH DR END.  You will receive a reminder letter in the mail two months in advance. If you don't receive a letter, please call our office to schedule the follow-up appointment.  If you need a refill on your cardiac medications before your next appointment, please call your pharmacy.   

## 2016-10-26 NOTE — Progress Notes (Signed)
Follow-up Outpatient Visit Date: 10/26/2016  Primary Care Provider: Enid Derry, MD [address] [place] [address]  Chief Complaint: Follow-up coronary artery disease  HPI:  Kathy Gregory is a 66 y.o. year-old female with history of history of hyperlipidemia, tobacco use, depression/anxiety, arthritis, migraine headaches, and incidentally noted coronary artery calcification on lung cancer screening CT who presents for follow-up. I last saw the patient on 07/26/16, at which time she was doing well with the exception of occasional rare palpitations. Today, she has no new symptoms. Palpitations continued to happen on an infrequent basis and are described as a brief "hesitation" lasting just a second. She has no accompanying symptoms. Kathy Gregory denies chest pain, shortness of breath, lightheadedness, leg edema, orthopnea, and PND. Her mobility has been somewhat limited due to arthritic issues, though she has been riding a bike and hopes to begin walking more when the weather improves. She has also lost 5-10 pounds since her last visit due to dietary modification. She recently underwent a sleep study and was diagnosed with sleep apnea. She is awaiting delivery of her CPAP. After her last visit, atorvastatin was increased to optimize lipid therapy. She is tolerating the higher dose well.  --------------------------------------------------------------------------------------------------  Cardiovascular History & Procedures: Cardiovascular Problems:  Coronary artery calcification  Risk Factors:  Coronary artery calcification, hyperlipidemia, and tobacco use  Cath/PCI:  None  CV Surgery:  None  EP Procedures and Devices:  None  Non-Invasive Evaluation(s):  None  Recent CV Pertinent Labs: Lab Results  Component Value Date   CHOL 127 10/20/2016   HDL 56 10/20/2016   LDLCALC 55 10/20/2016   TRIG 82 10/20/2016   CHOLHDL 2.3 10/20/2016   K 4.6 10/20/2016   BUN 12  10/20/2016   CREATININE 0.78 10/20/2016    Past medical and surgical history were reviewed and updated in EPIC.  Outpatient Encounter Prescriptions as of 10/26/2016  Medication Sig  . aspirin EC 81 MG tablet Take 1 tablet (81 mg total) by mouth daily.  . calcium-vitamin D (OSCAL WITH D) 250-125 MG-UNIT per tablet Take 1 tablet by mouth daily.  . clonazePAM (KLONOPIN) 0.5 MG tablet Take 0.5 tablets (0.25 mg total) by mouth 2 (two) times daily as needed for anxiety.  . Fish Oil-Cholecalciferol (FISH OIL + D3) 1000-1000 MG-UNIT CAPS Take by mouth.  . psyllium (REGULOID) 0.52 G capsule Take 0.52 g by mouth daily.  . traZODone (DESYREL) 50 MG tablet Take 1 tablet (50 mg total) by mouth at bedtime as needed for sleep.  . [DISCONTINUED] ciprofloxacin (CIPRO) 500 MG tablet Take 1 tablet (500 mg total) by mouth 2 (two) times daily.  Marland Kitchen atorvastatin (LIPITOR) 20 MG tablet Take 1 tablet (20 mg total) by mouth daily.   No facility-administered encounter medications on file as of 10/26/2016.     Allergies: Patient has no known allergies.  Social History   Social History  . Marital status: Widowed    Spouse name: N/A  . Number of children: N/A  . Years of education: N/A   Occupational History  . Not on file.   Social History Main Topics  . Smoking status: Current Every Day Smoker    Packs/day: 0.50    Years: 40.00    Types: Cigarettes  . Smokeless tobacco: Never Used     Comment: Trying to cut back  . Alcohol use 0.6 oz/week    1 Glasses of wine per week     Comment: occ  . Drug use: No  .  Sexual activity: No   Other Topics Concern  . Not on file   Social History Narrative  . No narrative on file    Family History  Problem Relation Age of Onset  . Hypertension Mother   . Diabetes Mother   . Heart disease Father 81    CABG  . Stroke Father 73    Occurred during CABG  . Breast cancer Paternal Grandmother     Review of Systems: A 12-system review of systems was performed  and was negative except as noted in the HPI.  --------------------------------------------------------------------------------------------------  Physical Exam: BP 104/76 (BP Location: Left Arm, Patient Position: Sitting, Cuff Size: Normal)   Pulse 67   Ht 5\' 7"  (1.702 m)   Wt 213 lb 4 oz (96.7 kg)   BMI 33.40 kg/m   General:  Obese woman, seated comfortably in the exam room. HEENT: No conjunctival pallor or scleral icterus.  Moist mucous membranes.  OP clear. Neck: Supple without lymphadenopathy, thyromegaly, JVD, or HJR. Lungs: Normal work of breathing.  Clear to auscultation bilaterally without wheezes or crackles. Heart: Regular rate and rhythm without murmurs, rubs, or gallops.  Non-displaced PMI. Abd: Bowel sounds present.  Soft, NT/ND without hepatosplenomegaly Ext: No lower extremity edema.  Radial, PT, and DP pulses are 2+ bilaterally. Skin: warm and dry without rash  Lab Results  Component Value Date   WBC 6.2 10/20/2016   HGB 14.5 10/20/2016   HCT 44.2 10/20/2016   MCV 95.7 10/20/2016   PLT 274 10/20/2016    Lab Results  Component Value Date   NA 142 10/20/2016   K 4.6 10/20/2016   CL 108 10/20/2016   CO2 29 10/20/2016   BUN 12 10/20/2016   CREATININE 0.78 10/20/2016   GLUCOSE 81 10/20/2016   ALT 21 10/20/2016    Lab Results  Component Value Date   CHOL 127 10/20/2016   HDL 56 10/20/2016   LDLCALC 55 10/20/2016   TRIG 82 10/20/2016   CHOLHDL 2.3 10/20/2016    --------------------------------------------------------------------------------------------------  ASSESSMENT AND PLAN: Coronary artery calcification The patient is without symptoms to suggest significant coronary insufficiency. I again reviewed her CT of the chest, which demonstrated small amount of calcification involving the LAD. We have agreed to continue with risk factor modification, including statin therapy. I encouraged the patient to increase her exercise and to stop smoking. We will  defer further testing.  Hyperlipidemia Most recent LDL earlier this month was quite good at 41. Kathy Gregory is tolerating atorvastatin 20 mg daily quite well, which we will continue indefinitely.  Palpitations Episodes are stable and infrequent. No worrisome accompanying symptoms. We will defer further testing at this time.  Follow-up: Return to clinic in one year.  Nelva Bush, MD 10/27/2016 7:28 AM

## 2016-10-27 ENCOUNTER — Encounter: Payer: Self-pay | Admitting: Internal Medicine

## 2016-10-27 DIAGNOSIS — R002 Palpitations: Secondary | ICD-10-CM

## 2016-10-27 DIAGNOSIS — I2584 Coronary atherosclerosis due to calcified coronary lesion: Principal | ICD-10-CM

## 2016-10-27 DIAGNOSIS — I2510 Atherosclerotic heart disease of native coronary artery without angina pectoris: Principal | ICD-10-CM

## 2016-10-31 DIAGNOSIS — G4733 Obstructive sleep apnea (adult) (pediatric): Secondary | ICD-10-CM | POA: Diagnosis not present

## 2016-11-01 ENCOUNTER — Telehealth: Payer: Self-pay | Admitting: *Deleted

## 2016-11-01 DIAGNOSIS — G4733 Obstructive sleep apnea (adult) (pediatric): Secondary | ICD-10-CM

## 2016-11-01 NOTE — Telephone Encounter (Signed)
CPAP study results.  Received: Yesterday  Message Contents  Laverle Hobby, MD  Renelda Mom, LPN         Recommend Auto-CPAP with pressure range 8-14 cmH2O.  Simplus FF Mask, Small            Pt informed of results. Order placed for auto CPAP. Nothing further needed.

## 2016-11-10 DIAGNOSIS — G4733 Obstructive sleep apnea (adult) (pediatric): Secondary | ICD-10-CM | POA: Diagnosis not present

## 2016-11-18 ENCOUNTER — Telehealth: Payer: Self-pay | Admitting: Family Medicine

## 2016-11-18 ENCOUNTER — Other Ambulatory Visit: Payer: Self-pay

## 2016-11-18 MED ORDER — TRAZODONE HCL 50 MG PO TABS: 50.0000 mg | ORAL_TABLET | Freq: Every evening | ORAL | 2 refills | Status: DC | PRN

## 2016-11-18 NOTE — Telephone Encounter (Signed)
Pt states per her insurance she needs 90 day supply on Trazodone. [phone]

## 2016-11-18 NOTE — Telephone Encounter (Signed)
Pt states per her insurance she needs 90 day supply on Trazodone. [phone]

## 2016-12-11 DIAGNOSIS — G4733 Obstructive sleep apnea (adult) (pediatric): Secondary | ICD-10-CM | POA: Diagnosis not present

## 2017-01-10 DIAGNOSIS — G4733 Obstructive sleep apnea (adult) (pediatric): Secondary | ICD-10-CM | POA: Diagnosis not present

## 2017-01-18 ENCOUNTER — Ambulatory Visit (INDEPENDENT_AMBULATORY_CARE_PROVIDER_SITE_OTHER): Payer: BLUE CROSS/BLUE SHIELD | Admitting: Family Medicine

## 2017-01-18 ENCOUNTER — Encounter: Payer: Self-pay | Admitting: Family Medicine

## 2017-01-18 DIAGNOSIS — Z720 Tobacco use: Secondary | ICD-10-CM

## 2017-01-18 DIAGNOSIS — I2510 Atherosclerotic heart disease of native coronary artery without angina pectoris: Secondary | ICD-10-CM

## 2017-01-18 DIAGNOSIS — E782 Mixed hyperlipidemia: Secondary | ICD-10-CM

## 2017-01-18 DIAGNOSIS — E669 Obesity, unspecified: Secondary | ICD-10-CM | POA: Diagnosis not present

## 2017-01-18 DIAGNOSIS — F410 Panic disorder [episodic paroxysmal anxiety] without agoraphobia: Secondary | ICD-10-CM

## 2017-01-18 DIAGNOSIS — F17200 Nicotine dependence, unspecified, uncomplicated: Secondary | ICD-10-CM | POA: Insufficient documentation

## 2017-01-18 DIAGNOSIS — R718 Other abnormality of red blood cells: Secondary | ICD-10-CM

## 2017-01-18 NOTE — Progress Notes (Signed)
BP 120/64   Pulse 72   Temp 98.4 F (36.9 C) (Oral)   Resp 16   Wt 214 lb 9.6 oz (97.3 kg)   SpO2 96%   BMI 33.61 kg/m    Subjective:    Patient ID: Kathy Gregory, female    DOB: 1951-08-12, 66 y.o.   MRN: [id number]  HPI: Kathy Gregory is a 66 y.o. female  Chief Complaint  Patient presents with  . Follow-up   HPI Patient is here for f/u  She has high cholesterol; on statin prescribed by Dr. Saunders Revel, cardiologist; she also has coronary artery disease and aortic atherosclerosis Lab Results  Component Value Date   CHOL 127 10/20/2016   CHOL 161 04/18/2016   Lab Results  Component Value Date   HDL 56 10/20/2016   HDL 66 04/18/2016   Lab Results  Component Value Date   LDLCALC 55 10/20/2016   [place] 78 04/18/2016   Lab Results  Component Value Date   TRIG 82 10/20/2016   TRIG 85 04/18/2016   Lab Results  Component Value Date   CHOLHDL 2.3 10/20/2016   CHOLHDL 2.4 04/18/2016   No results found for: LDLDIRECT  She has anxiety listed, and has benzo in her medicine list; she has been writing on her calendar at work when she needs to take a benzo, about three anxiety attacks since March; has only used one benzo; can't thnk of any reason; knows when they happen; gets feeling in her throat, like she needs to swallow, globus sensation; counts out of order, helps I reviewed the [place] web site over the last 12 months; the only fill of a controlled substance was the five benzo pills prescribed by me in December 2017  Since last visit, she was diagnosed with sleep apnea, using CPAP since March 22nd; Dr. Ashby Dawes; she is supposed to f/u with him soon; can't tell a huge difference in quality of sleep; it's an adjustment; takes her a little longer to fall asleep  Current tobacco use; she has reduced her tobacco use; habit makes her pick up; she smokes in the car; she sometimes has the urge too or stressed at work, goes outside to smoke; she thinks a lot is mental  habit  Depression screen [facility] 2/9 01/18/2017 07/20/2016 04/18/2016 04/13/2015  Decreased Interest 0 0 0 0  Down, Depressed, Hopeless 0 0 1 0  PHQ - 2 Score 0 0 1 0   Relevant past medical, surgical, family and social history reviewed Past Medical History:  Diagnosis Date  . Anxiety   . Arthritis    hips, collarbone  . Depression   . Heart murmur 07/24/2016  . Hyperlipidemia   . Migraines    4-5 per year  . Scoliosis    mild  . Sleep apnea   . Vertigo    2-3x/yr   Past Surgical History:  Procedure Laterality Date  . BREAST CYST ASPIRATION Left   . COLONOSCOPY  2006  . COLONOSCOPY WITH PROPOFOL N/A 05/15/2015   Procedure: COLONOSCOPY WITH PROPOFOL;  Surgeon: Lucilla Lame, MD;  Location: [place];  Service: Endoscopy;  Laterality: N/A;  . POLYPECTOMY  05/15/2015   Procedure: POLYPECTOMY;  Surgeon: Lucilla Lame, MD;  Location: [place];  Service: Endoscopy;;   Family History  Problem Relation Age of Onset  . Hypertension Mother   . Diabetes Mother   . Heart disease Father 51       CABG  . Stroke Father 48  Occurred during CABG  . Breast cancer Paternal Grandmother   . Diabetes Brother   . Hypertension Brother   . Kidney disease Maternal Grandmother   . Cirrhosis Brother    Social History   Social History  . Marital status: Widowed    Spouse name: N/A  . Number of children: N/A  . Years of education: N/A   Occupational History  . Not on file.   Social History Main Topics  . Smoking status: Current Every Day Smoker    Packs/day: 0.50    Years: 40.00    Types: Cigarettes  . Smokeless tobacco: Never Used     Comment: Trying to cut back  . Alcohol use 0.6 oz/week    1 Glasses of wine per week     Comment: occ  . Drug use: No  . Sexual activity: No   Other Topics Concern  . Not on file   Social History Narrative  . No narrative on file   Interim medical history since last visit reviewed. Allergies and medications reviewed  Review  of Systems Per HPI unless specifically indicated above     Objective:    BP 120/64   Pulse 72   Temp 98.4 F (36.9 C) (Oral)   Resp 16   Wt 214 lb 9.6 oz (97.3 kg)   SpO2 96%   BMI 33.61 kg/m   Wt Readings from Last 3 Encounters:  01/18/17 214 lb 9.6 oz (97.3 kg)  10/26/16 213 lb 4 oz (96.7 kg)  08/09/16 218 lb 9.6 oz (99.2 kg)    Physical Exam  Constitutional: She appears well-developed and well-nourished. No distress.  HENT:  Head: Normocephalic and atraumatic.  Eyes: EOM are normal. No scleral icterus.  Neck: No thyromegaly present.  Cardiovascular: Normal rate, regular rhythm and normal heart sounds.   No murmur heard. Pulmonary/Chest: Effort normal and breath sounds normal. She has no wheezes.  Abdominal: Soft. Bowel sounds are normal. She exhibits no distension.  Musculoskeletal: She exhibits no edema.  Neurological: She is alert. She exhibits normal muscle tone.  Skin: Skin is warm and dry. She is not diaphoretic. No pallor.  Psychiatric: She has a normal mood and affect. Her behavior is normal. Judgment and thought content normal.   Results for orders placed or performed in visit on 10/20/16  Lipid panel  Result Value Ref Range   Cholesterol 127 <200 mg/dL   Triglycerides 82 <150 mg/dL   HDL 56 >50 mg/dL   Total CHOL/HDL Ratio 2.3 <5.0 Ratio   VLDL 16 <30 mg/dL   LDL Cholesterol 55 <100 mg/dL  CBC with Differential/Platelet  Result Value Ref Range   WBC 6.2 3.8 - 10.8 K/uL   RBC 4.62 3.80 - 5.10 MIL/uL   Hemoglobin 14.5 11.7 - 15.5 g/dL   HCT 44.2 35.0 - 45.0 %   MCV 95.7 80.0 - 100.0 fL   MCH 31.4 27.0 - 33.0 pg   MCHC 32.8 32.0 - 36.0 g/dL   RDW 13.1 11.0 - 15.0 %   Platelets 274 140 - 400 K/uL   MPV 10.7 7.5 - 12.5 fL   Neutro Abs 3,720 1,500 - 7,800 cells/uL   Lymphs Abs 1,612 850 - 3,900 cells/uL   Monocytes Absolute 620 200 - 950 cells/uL   Eosinophils Absolute 186 15 - 500 cells/uL   Basophils Absolute 62 0 - 200 cells/uL   Neutrophils  Relative % 60 %   Lymphocytes Relative 26 %   Monocytes Relative 10 %  Eosinophils Relative 3 %   Basophils Relative 1 %   Smear Review Criteria for review not met   COMPLETE METABOLIC PANEL WITH GFR  Result Value Ref Range   Sodium 142 135 - 146 mmol/L   Potassium 4.6 3.5 - 5.3 mmol/L   Chloride 108 98 - 110 mmol/L   CO2 29 20 - 31 mmol/L   Glucose, Bld 81 65 - 99 mg/dL   BUN 12 7 - 25 mg/dL   Creat 0.78 0.50 - 0.99 mg/dL   Total Bilirubin 0.4 0.2 - 1.2 mg/dL   Alkaline Phosphatase 76 33 - 130 U/L   AST 19 10 - 35 U/L   ALT 21 6 - 29 U/L   Total Protein 6.4 6.1 - 8.1 g/dL   Albumin 3.7 3.6 - 5.1 g/dL   Calcium 9.4 8.6 - 10.4 mg/dL   GFR, Est African American >89 >=60 mL/min   GFR, Est Non African American 81 >=60 mL/min      Assessment & Plan:   Problem List Items Addressed This Visit      Cardiovascular and Mediastinum   Coronary artery disease (Chronic)    Reviewed Dr. Darnelle Bos note; increase activity as tolerated, aspirin, statin, smoking cessation, etc.        Other   Tobacco abuse    Encouraged patient to quit smoking; talked with her about why she smokes, what makes her reach for a cigarette; we discussed other things she can do to keep her hand busy, such as straws, worry stones, rubber bands, etc.; quit now number and cessation class phone numbers provided; >=3 minutes of smoking cessation counseling provided today      Obesity (BMI 30.0-34.9)    Encouragement given; as she quits smoking, take care to not gain weight; use other hand habits      HLD (hyperlipidemia) (Chronic)    LDL was 55; showed model of aorta with atherosclerosis; limit saturated fats      Elevated hematocrit    Resolved on last check; was found to have OSA and now on CPAP      Anxiety attack    Glad that patient is significantly limiting her use of benzo; she declined refill today, has a few left from the five that were filled in December; okay for limited refill if needed prior to next  appointment (five pills); see AVS for ideas provided for dealing with anxiety          Follow up plan: Return for complete physical in August (already scheduled).  An after-visit summary was printed and given to the patient at [facility] [place].  Please see the patient instructions which may contain other information and recommendations beyond what is mentioned above in the assessment and plan.  No orders of the defined types were placed in this encounter.   No orders of the defined types were placed in this encounter.

## 2017-01-18 NOTE — Assessment & Plan Note (Signed)
Reviewed Dr. Darnelle Bos note; increase activity as tolerated, aspirin, statin, smoking cessation, etc.

## 2017-01-18 NOTE — Assessment & Plan Note (Signed)
Glad that patient is significantly limiting her use of benzo; she declined refill today, has a few left from the five that were filled in December; okay for limited refill if needed prior to next appointment (five pills); see AVS for ideas provided for dealing with anxiety

## 2017-01-18 NOTE — Assessment & Plan Note (Addendum)
Encouraged patient to quit smoking; talked with her about why she smokes, what makes her reach for a cigarette; we discussed other things she can do to keep her hand busy, such as straws, worry stones, rubber bands, etc.; quit now number and cessation class phone numbers provided; >=3 minutes of smoking cessation counseling provided today

## 2017-01-18 NOTE — Patient Instructions (Addendum)
Try straws or worry stones or something else with your hands  I do encourage you to quit smoking Call [phone] to sign up for smoking cessation classes You can call 1-800-QUIT-NOW to talk with a smoking cessation coach  Check out the information at familydoctor.org entitled "Nutrition for Weight Loss: What You Need to Know about Fad Diets" Try to lose between 1-2 pounds per week by taking in fewer calories and burning off more calories You can succeed by limiting portions, limiting foods dense in calories and fat, becoming more active, and drinking 8 glasses of water a day (64 ounces) Don't skip meals, especially breakfast, as skipping meals may alter your metabolism Do not use over-the-counter weight loss pills or gimmicks that claim rapid weight loss A healthy BMI (or body mass index) is between 18.5 and 24.9 You can calculate your ideal BMI at the [place] website ClubMonetize.fr   12 Ways to Curb Anxiety  ?Anxiety is normal human sensation. It is what helped our ancestors survive the pitfalls of the wilderness. Anxiety is defined as experiencing worry or nervousness about an imminent event or something with an uncertain outcome. It is a feeling experienced by most people at some point in their lives. Anxiety can be triggered by a very personal issue, such as the illness of a loved one, or an event of global proportions, such as a refugee crisis. Some of the symptoms of anxiety are:  Feeling restless.  Having a feeling of impending danger.  Increased heart rate.  Rapid breathing. Sweating.  Shaking.  Weakness or feeling tired.  Difficulty concentrating on anything except the current worry.  Insomnia.  Stomach or bowel problems. What can we do about anxiety we may be feeling? There are many techniques to help manage stress and relax. Here are 12 ways you can reduce your anxiety almost immediately: 1. Turn off the constant feed of  information. Take a social media sabbatical. Studies have shown that social media directly contributes to social anxiety.  2. Monitor your television viewing habits. Are you watching shows that are also contributing to your anxiety, such as 24-hour news stations? Try watching something else, or better yet, nothing at all. Instead, listen to music, read an inspirational book or practice a hobby. 3. Eat nutritious meals. Also, don't skip meals and keep healthful snacks on hand. Hunger and poor diet contributes to feeling anxious. 4. Sleep. Sleeping on a regular schedule for at least seven to eight hours a night will do wonders for your outlook when you are awake. 5. Exercise. Regular exercise will help rid your body of that anxious energy and help you get more restful sleep. 6. Try deep (diaphragmatic) breathing. Inhale slowly through your nose for five seconds and exhale through your mouth. 7. Practice acceptance and gratitude. When anxiety hits, accept that there are things out of your control that shouldn't be of immediate concern.  8. Seek out humor. When anxiety strikes, watch a funny video, read jokes or call a friend who makes you laugh. Laughter is healing for our bodies and releases endorphins that are calming. 9. Stay positive. Take the effort to replace negative thoughts with positive ones. Try to see a stressful situation in a positive light. Try to come up with solutions rather than dwelling on the problem. 10. Figure out what triggers your anxiety. Keep a journal and make note of anxious moments and the events surrounding them. This will help you identify triggers you can avoid or even eliminate. 11. Talk to  someone. Let a trusted friend, family member or even trained professional know that you are feeling overwhelmed and anxious. Verbalize what you are feeling and why.  12. Volunteer. If your anxiety is triggered by a crisis on a large scale, become an advocate and work to resolve the problem  that is causing you unease. Anxiety is often unwelcome and can become overwhelming. If not kept in check, it can become a disorder that could require medical treatment. However, if you take the time to care for yourself and avoid the triggers that make you anxious, you will be able to find moments of relaxation and clarity that make your life much more enjoyable.    Steps to Elicit the Relaxation Response The following is the technique reprinted with permission from Dr. Billie Ruddy book The Relaxation Response pages 162-163 1. Sit quietly in a comfortable position. 2. Close your eyes. 3. Deeply relax all your muscles,  beginning at your feet and progressing up to your face.  Keep them relaxed. 4. Breathe through your nose.  Become aware of your breathing.  As you breathe out, say the word, "one"*,  silently to yourself. For example,  breathe in ... out, "one",- in .. out, "one", etc.  Breathe easily and naturally. 5. Continue for 10 to 20 minutes.  You may open your eyes to check the time, but do not use an alarm.  When you finish, sit quietly for several minutes,  at first with your eyes closed and later with your eyes opened.  Do not stand up for a few minutes. 6. Do not worry about whether you are successful  in achieving a deep level of relaxation.  Maintain a passive attitude and permit relaxation to occur at its own pace.  When distracting thoughts occur,  try to ignore them by not dwelling upon them  and return to repeating "one."  With practice, the response should come with little effort.  Practice the technique once or twice daily,  but not within two hours after any meal,  since the digestive processes seem to interfere with  the elicitation of the Relaxation Response. * It is better to use a soothing, mellifluous sound, preferably with no meaning. or association, to avoid stimulation of unnecessary thoughts - a mantra.   Steps to Quit Smoking Smoking tobacco  can be bad for your health. It can also affect almost every organ in your body. Smoking puts you and people around you at risk for many serious long-lasting (chronic) diseases. Quitting smoking is hard, but it is one of the best things that you can do for your health. It is never too late to quit. What are the benefits of quitting smoking? When you quit smoking, you lower your risk for getting serious diseases and conditions. They can include:  Lung cancer or lung disease.  Heart disease.  Stroke.  Heart attack.  Not being able to have children (infertility).  Weak bones (osteoporosis) and broken bones (fractures). If you have coughing, wheezing, and shortness of breath, those symptoms may get better when you quit. You may also get sick less often. If you are pregnant, quitting smoking can help to lower your chances of having a baby of low birth weight. What can I do to help me quit smoking? Talk with your doctor about what can help you quit smoking. Some things you can do (strategies) include:  Quitting smoking totally, instead of slowly cutting back how much you smoke over a period of time.  Going to in-person counseling. You are more likely to quit if you go to many counseling sessions.  Using resources and support systems, such as:  Online chats with a Social worker.  Phone quitlines.  Printed Furniture conservator/restorer.  Support groups or group counseling.  Text messaging programs.  Mobile phone apps or applications.  Taking medicines. Some of these medicines may have nicotine in them. If you are pregnant or breastfeeding, do not take any medicines to quit smoking unless your doctor says it is okay. Talk with your doctor about counseling or other things that can help you. Talk with your doctor about using more than one strategy at the same time, such as taking medicines while you are also going to in-person counseling. This can help make quitting easier. What things can I do to make  it easier to quit? Quitting smoking might feel very hard at first, but there is a lot that you can do to make it easier. Take these steps:  Talk to your family and friends. Ask them to support and encourage you.  Call phone quitlines, reach out to support groups, or work with a Social worker.  Ask people who smoke to not smoke around you.  Avoid places that make you want (trigger) to smoke, such as:  Bars.  Parties.  Smoke-break areas at work.  Spend time with people who do not smoke.  Lower the stress in your life. Stress can make you want to smoke. Try these things to help your stress:  Getting regular exercise.  Deep-breathing exercises.  Yoga.  Meditating.  Doing a body scan. To do this, close your eyes, focus on one area of your body at a time from head to toe, and notice which parts of your body are tense. Try to relax the muscles in those areas.  Download or buy apps on your mobile phone or tablet that can help you stick to your quit plan. There are many free apps, such as QuitGuide from the [employer] Office manager for Disease Control and Prevention). You can find more support from smokefree.gov and other websites. This information is not intended to replace advice given to you by your health care provider. Make sure you discuss any questions you have with your health care provider. Document Released: 06/04/2009 Document Revised: 04/05/2016 Document Reviewed: 12/23/2014 Elsevier Interactive Patient Education  2017 [employer].

## 2017-01-18 NOTE — Assessment & Plan Note (Signed)
LDL was 55; showed model of aorta with atherosclerosis; limit saturated fats

## 2017-01-18 NOTE — Assessment & Plan Note (Signed)
Resolved on last check; was found to have OSA and now on CPAP

## 2017-01-18 NOTE — Assessment & Plan Note (Signed)
Encouragement given; as she quits smoking, take care to not gain weight; use other hand habits

## 2017-02-06 ENCOUNTER — Encounter: Payer: Self-pay | Admitting: Internal Medicine

## 2017-02-08 ENCOUNTER — Ambulatory Visit (INDEPENDENT_AMBULATORY_CARE_PROVIDER_SITE_OTHER): Payer: BLUE CROSS/BLUE SHIELD | Admitting: Internal Medicine

## 2017-02-08 ENCOUNTER — Encounter: Payer: Self-pay | Admitting: Internal Medicine

## 2017-02-08 VITALS — BP 114/68 | HR 91 | Resp 16 | Ht 67.0 in | Wt 214.0 lb

## 2017-02-08 DIAGNOSIS — G4733 Obstructive sleep apnea (adult) (pediatric): Secondary | ICD-10-CM

## 2017-02-08 NOTE — Progress Notes (Signed)
[facility] Consultation      Assessment and Plan:  Obstructive sleep apnea. --Doing well with CPAP on auto-set with pressure of 8-14.   Nicotine abuse. --Spent > 3 min in counseling.  -Discussed that she and her daughter should set a quit date together. Discussed the possible role of supplements such as Chantix or nicotine patches, asked that she call us back when she is ready to try them we would be happy to prescribe one of these for her.  Emphysema. -Seen on most recent CT lung cancer screening, per the radiology report. -On my review of the CT scan. This is minimal to nil.  Anxiety and depression. --Controlled.  Date: 02/08/2017  MRN# [id number] Kathy Gregory 24-Apr-1951    [name] is a 66 y.o. old female seen in consultation for chief complaint of:    Chief Complaint  Patient presents with  . Sleep Apnea    3 mos f/u OSA  Pt wears CPAP 6-8 hrs nightly.Pt c/o of having bags under her eyes. She feels it now takes her longer to fall asleep.    HPI:  The patient is a 66 year old female was last seen with symptoms of hypersomnia, consistent with obstructive sleep apnea. This was confirmed on sleep study. She has since been started on CPAP. She notes that since starting it she has some bags under her eyes.   Review of download data; pressure range 8-14. 95th percentile pressure was 11, maximum 11.7. Residual AHI 0.1. Average usage of all days is 7 hours 26 minutes, uses greater than 4 hours is 97% over the last 30 days.  CT 05/03/16 some strandy atelectasis, but minimal to no emphysema noted.    Medication:   Reviewed    Allergies:  Patient has no known allergies.  Review of Systems: Gen:  Denies  fever, sweats, chills HEENT: Denies blurred vision, double vision. bleeds, sore throat Cvc:  No dizziness, chest pain. Resp:   Denies cough or sputum production, shortness of breath Gi: Denies swallowing difficulty, stomach pain. Gu:  Denies  bladder incontinence, burning urine Ext:   No Joint pain, stiffness. Skin: No skin rash,  hives  Endoc:  No polyuria, polydipsia. Psych: No depression, insomnia. Other:  All other systems were reviewed with the patient and were negative other that what is mentioned in the HPI.   Physical Examination:   VS: BP 114/68 (BP Location: Left Arm, Patient Position: Sitting, Cuff Size: Normal)   Pulse 91   Resp 16   Ht 5\' 7"  (1.702 m)   Wt 97.1 kg (214 lb)   SpO2 94%   BMI 33.52 kg/m   General Appearance: No distress  Neuro:without focal findings,  speech normal,  HEENT: PERRLA, EOM intact.  [place] 2.  Pulmonary: normal breath sounds, No wheezing.  CardiovascularNormal S1,S2.  No m/r/g.   Abdomen: Benign, Soft, non-tender. Renal:  No costovertebral tenderness  GU:  No performed at this time. Endoc: No evident thyromegaly, no signs of acromegaly. Skin:   warm, no rashes, no ecchymosis  Extremities: normal, no cyanosis, clubbing.  Other findings:    LABORATORY PANEL:   CBC No results for input(s): WBC, HGB, HCT, PLT in the last 168 hours. ------------------------------------------------------------------------------------------------------------------  Chemistries  No results for input(s): NA, K, CL, CO2, GLUCOSE, BUN, CREATININE, CALCIUM, MG, AST, ALT, ALKPHOS, BILITOT in the last 168 hours.  Invalid input(s): GFRCGP ------------------------------------------------------------------------------------------------------------------  Cardiac Enzymes No results for input(s): TROPONINI in the last 168 hours. ------------------------------------------------------------  RADIOLOGY:  No results found.     Thank  you for the consultation and for allowing [facility] to assist in the care of your patient. Our recommendations are noted above.  Please contact us if we can be of further service.   Marda Stalker, MD.  Board Certified in Internal  Medicine, Pulmonary Medicine, [place], and Sleep Medicine.  [facility] Office Number: [phone]  Patricia Pesa, M.D.  Vilinda Boehringer, M.D.  Merton Border, M.D  02/08/2017

## 2017-02-08 NOTE — Patient Instructions (Signed)
You are doing great with your CPAP continue to use it every night!

## 2017-02-10 DIAGNOSIS — G4733 Obstructive sleep apnea (adult) (pediatric): Secondary | ICD-10-CM | POA: Diagnosis not present

## 2017-03-20 ENCOUNTER — Other Ambulatory Visit: Payer: Self-pay | Admitting: Family Medicine

## 2017-03-20 DIAGNOSIS — Z1231 Encounter for screening mammogram for malignant neoplasm of breast: Secondary | ICD-10-CM

## 2017-04-20 ENCOUNTER — Other Ambulatory Visit: Payer: Self-pay | Admitting: Family Medicine

## 2017-04-20 ENCOUNTER — Encounter: Payer: Self-pay | Admitting: Family Medicine

## 2017-04-20 ENCOUNTER — Ambulatory Visit (INDEPENDENT_AMBULATORY_CARE_PROVIDER_SITE_OTHER): Payer: BLUE CROSS/BLUE SHIELD | Admitting: Family Medicine

## 2017-04-20 VITALS — BP 118/60 | HR 81 | Temp 97.8°F | Resp 14 | Ht 67.08 in | Wt 217.5 lb

## 2017-04-20 DIAGNOSIS — H6093 Unspecified otitis externa, bilateral: Secondary | ICD-10-CM

## 2017-04-20 DIAGNOSIS — Z124 Encounter for screening for malignant neoplasm of cervix: Secondary | ICD-10-CM | POA: Diagnosis not present

## 2017-04-20 DIAGNOSIS — M85859 Other specified disorders of bone density and structure, unspecified thigh: Secondary | ICD-10-CM | POA: Diagnosis not present

## 2017-04-20 DIAGNOSIS — Z23 Encounter for immunization: Secondary | ICD-10-CM | POA: Diagnosis not present

## 2017-04-20 DIAGNOSIS — Z720 Tobacco use: Secondary | ICD-10-CM

## 2017-04-20 DIAGNOSIS — Z0000 Encounter for general adult medical examination without abnormal findings: Secondary | ICD-10-CM | POA: Diagnosis not present

## 2017-04-20 DIAGNOSIS — E66811 Obesity, class 1: Secondary | ICD-10-CM

## 2017-04-20 DIAGNOSIS — E669 Obesity, unspecified: Secondary | ICD-10-CM

## 2017-04-20 LAB — CBC WITH DIFFERENTIAL/PLATELET
BASOS PCT: 1 %
Basophils Absolute: 66 cells/uL (ref 0–200)
EOS PCT: 3 %
Eosinophils Absolute: 198 cells/uL (ref 15–500)
HEMATOCRIT: 45.4 % — AB (ref 35.0–45.0)
Hemoglobin: 14.7 g/dL (ref 11.7–15.5)
LYMPHS ABS: 1848 {cells}/uL (ref 850–3900)
LYMPHS PCT: 28 %
MCH: 31.3 pg (ref 27.0–33.0)
MCHC: 32.4 g/dL (ref 32.0–36.0)
MCV: 96.6 fL (ref 80.0–100.0)
MONO ABS: 594 {cells}/uL (ref 200–950)
MPV: 10.3 fL (ref 7.5–12.5)
Monocytes Relative: 9 %
Neutro Abs: 3894 cells/uL (ref 1500–7800)
Neutrophils Relative %: 59 %
Platelets: 280 10*3/uL (ref 140–400)
RBC: 4.7 MIL/uL (ref 3.80–5.10)
RDW: 13.3 % (ref 11.0–15.0)
WBC: 6.6 10*3/uL (ref 3.8–10.8)

## 2017-04-20 MED ORDER — NEOMYCIN-POLYMYXIN-HC 3.5-10000-1 OT SOLN: 3.0000 [drp] | Freq: Four times a day (QID) | OTIC | 1 refills | Status: DC

## 2017-04-20 NOTE — Assessment & Plan Note (Signed)
Pap today 

## 2017-04-20 NOTE — Progress Notes (Signed)
Patient ID: Kathy Gregory, female   DOB: 1951/05/28, 66 y.o.   MRN: [id number]   Subjective:   Kathy Gregory is a 66 y.o. female here for a complete physical exam  Interim issues since last visit:   USPSTF grade A and B recommendations Depression:  Depression screen [facility] 2/9 04/20/2017 01/18/2017 07/20/2016 04/18/2016 04/13/2015  Decreased Interest 0 0 0 0 0  Down, Depressed, Hopeless 1 0 0 1 0  PHQ - 2 Score 1 0 0 1 0   Hypertension: well-controlled BP Readings from Last 3 Encounters:  04/20/17 118/60  02/08/17 114/68  01/18/17 120/64   Obesity: a work in progress [employer] from Last 3 Encounters:  04/20/17 217 lb 8 oz (98.7 kg)  02/08/17 214 lb (97.1 kg)  01/18/17 214 lb 9.6 oz (97.3 kg)   BMI Readings from Last 3 Encounters:  04/20/17 33.99 kg/m  02/08/17 33.52 kg/m  01/18/17 33.61 kg/m    Alcohol: 1 glass or less a week Tobacco use: ongoing; offered help to quit; she'll try to quit with coach; has cut back a lot; "a work in progress" HIV, hep B, hep C: completed STD testing and prevention (chl/gon/syphilis): declined Intimate partner violence: no abuse Breast cancer: no lumps BRCA gene screening: grandmother (paternal) had breast cancer in her 36's Cervical cancer screening: today; abnormal many years ago, 20-30 years ago, repeats were normal Osteoporosis: order DEXA Fall prevention/vitamin D: discussed fall precautions, taking vit D with calcium Lipids:  Lab Results  Component Value Date   CHOL 127 10/20/2016   CHOL 161 04/18/2016   Lab Results  Component Value Date   HDL 56 10/20/2016   HDL 66 04/18/2016   Lab Results  Component Value Date   LDLCALC 55 10/20/2016   Nora 78 04/18/2016   Lab Results  Component Value Date   TRIG 82 10/20/2016   TRIG 85 04/18/2016   Lab Results  Component Value Date   CHOLHDL 2.3 10/20/2016   CHOLHDL 2.4 04/18/2016   No results found for: LDLDIRECT Glucose:  Glucose, Bld  Date Value Ref Range Status   10/20/2016 81 65 - 99 mg/dL Final  04/18/2016 90 65 - 99 mg/dL Final   Colorectal cancer: 2016, next 2021 Lung cancer:  Smoked 30 years; has had CT scan; reviewed findings; has emphysema, seeing Dr. Saunders Revel for atherosclerosis and 1 vessel CAD AAA: discuss at age 19 Aspirin: taking daily aspirin Diet: tries to be a good eater; does get enough water Exercise: not enough; will try to get back on the treadmill; start low and build up slowly Skin cancer: no worrisome moles  Past Medical History:  Diagnosis Date  . Anxiety   . Arthritis    hips, collarbone  . Depression   . Heart murmur 07/24/2016  . Hyperlipidemia   . Migraines    4-5 per year  . Scoliosis    mild  . Sleep apnea   . Vertigo    2-3x/yr   Past Surgical History:  Procedure Laterality Date  . BREAST CYST ASPIRATION Left   . COLONOSCOPY  2006  . COLONOSCOPY WITH PROPOFOL N/A 05/15/2015   Procedure: COLONOSCOPY WITH PROPOFOL;  Surgeon: Lucilla Lame, MD;  Location: [place];  Service: Endoscopy;  Laterality: N/A;  . POLYPECTOMY  05/15/2015   Procedure: POLYPECTOMY;  Surgeon: Lucilla Lame, MD;  Location: [place];  Service: Endoscopy;;   Family History  Problem Relation Age of Onset  . Hypertension Mother   . Diabetes Mother   .  Heart disease Father 51       CABG  . Stroke Father 74       Occurred during CABG  . Breast cancer Paternal Grandmother   . Diabetes Brother   . Hypertension Brother   . Kidney disease Maternal Grandmother   . Cirrhosis Brother    Social History  Substance Use Topics  . Smoking status: Current Every Day Smoker    Packs/day: 0.50    Years: 40.00    Types: Cigarettes  . Smokeless tobacco: Never Used     Comment: Trying to cut back  . Alcohol use 0.6 oz/week    1 Glasses of wine per week     Comment: occ   Review of Systems  Constitutional: Negative for diaphoresis and fever.  HENT: Positive for hearing loss (stopped up today, for several days). Negative for  dental problem.   Eyes:       Cataracts are growing  Respiratory: Negative for shortness of breath.   Cardiovascular: Negative for chest pain.  Gastrointestinal: Negative for blood in stool.  Endocrine: Negative for polydipsia.  Genitourinary: Negative for hematuria.  Musculoskeletal: Positive for arthralgias ("oh yeah", right 3rd finger, hips).  Neurological: Negative for tremors.  Hematological: Negative for adenopathy.  Psychiatric/Behavioral: Negative for dysphoric mood.   Objective:   Vitals:   04/20/17 0836  BP: 118/60  Pulse: 81  Resp: 14  Temp: 97.8 F (36.6 C)  TempSrc: Oral  SpO2: 98%  Weight: 217 lb 8 oz (98.7 kg)  Height: 5' 7.07" (1.704 m)   Body mass index is 33.99 kg/m. Wt Readings from Last 3 Encounters:  04/20/17 217 lb 8 oz (98.7 kg)  02/08/17 214 lb (97.1 kg)  01/18/17 214 lb 9.6 oz (97.3 kg)   Physical Exam  Constitutional: She appears well-developed and well-nourished.  HENT:  Head: Normocephalic and atraumatic.  Cerumen and debris in both EACs  Eyes: Conjunctivae and EOM are normal. Right eye exhibits no hordeolum. Left eye exhibits no hordeolum. No scleral icterus.  Neck: Carotid bruit is not present. No thyromegaly present.  Cardiovascular: Normal rate, regular rhythm, S1 normal, S2 normal and normal heart sounds.   No extrasystoles are present.  Pulmonary/Chest: Effort normal and breath sounds normal. No respiratory distress. Right breast exhibits no inverted nipple, no mass, no nipple discharge, no skin change and no tenderness. Left breast exhibits no inverted nipple, no mass, no nipple discharge, no skin change and no tenderness. Breasts are symmetrical.  Abdominal: Soft. Normal appearance and bowel sounds are normal. She exhibits no distension, no abdominal bruit, no pulsatile midline mass and no mass. There is no hepatosplenomegaly. There is no tenderness. No hernia.  Genitourinary: Uterus normal. Pelvic exam was performed with patient prone.  There is no rash or lesion on the right labia. There is no rash or lesion on the left labia. Cervix exhibits no motion tenderness. Right adnexum displays no mass, no tenderness and no fullness. Left adnexum displays no mass, no tenderness and no fullness.  Musculoskeletal: Normal range of motion. She exhibits no edema.  Lymphadenopathy:       Head (right side): No submandibular adenopathy present.       Head (left side): No submandibular adenopathy present.    She has no cervical adenopathy.    She has no axillary adenopathy.  Neurological: She is alert. She displays no tremor. No cranial nerve deficit. She exhibits normal muscle tone. Gait normal.  Skin: Skin is warm and dry. No bruising and no  ecchymosis noted. No cyanosis. No pallor.  Psychiatric: Her speech is normal and behavior is normal. Thought content normal. Her mood appears not anxious. She does not exhibit a depressed mood.    Assessment/Plan:   Problem List Items Addressed This Visit      Musculoskeletal and Integument   Osteopenia (Chronic)    Order DEXA; 1200 mg of calcium daily divided up 2-3 servings      Relevant Orders   DG Bone Density     Other   Needs flu shot   Relevant Orders   Flu Vaccine QUAD 6+ mos PF IM (Fluarix Quad PF) (Completed)   Tobacco abuse    Order chest CT      Relevant Orders   CT CHEST LUNG CA SCREEN LOW DOSE W/O CM   Preventative health care - Primary    USPSTF grade A and B recommendations reviewed with patient; age-appropriate recommendations, preventive care, screening tests, etc discussed and encouraged; healthy living encouraged; see AVS for patient education given to patient       Relevant Orders   CBC with Differential/Platelet   COMPLETE METABOLIC PANEL WITH GFR   Lipid panel   TSH   Obesity (BMI 30.0-34.9)    Encouragement given to lose weight      Cervical cancer screening    Pap today      Relevant Orders   Pap IG and HPV (high risk) DNA detection    Other  Visit Diagnoses    Tobacco use       Relevant Orders   CT CHEST LUNG CA SCREEN LOW DOSE W/O CM   Otitis externa of both ears, unspecified chronicity, unspecified type       likely influenced by seborrhea; eat gtts prescribed       Meds ordered this encounter  Medications  . atorvastatin (LIPITOR) 20 MG tablet    Sig: Take 20 mg by mouth at bedtime.    Refill:  3  . neomycin-polymyxin-hydrocortisone (CORTISPORIN) OTIC solution    Sig: Place 3 drops into both ears 4 (four) times daily.    Dispense:  10 mL    Refill:  1   Orders Placed This Encounter  Procedures  . DG Bone Density    Order Specific Question:   Reason for Exam (SYMPTOM  OR DIAGNOSIS REQUIRED)    Answer:   osteopenia    Order Specific Question:   Preferred imaging location?    Answer:   [facility]  . CT CHEST LUNG CA SCREEN LOW DOSE W/O CM    Standing Status:   Future    Standing Expiration Date:   06/20/2018    Order Specific Question:   Reason for Exam (SYMPTOM  OR DIAGNOSIS REQUIRED)    Answer:   due for yearly f/u    Order Specific Question:   Preferred imaging location?    Answer:   [facility]    Order Specific Question:   Radiology Contrast Protocol - do NOT remove file path    Answer:   [email or web address]  . Flu Vaccine QUAD 6+ mos PF IM (Fluarix Quad PF)  . CBC with Differential/Platelet  . COMPLETE METABOLIC PANEL WITH GFR  . Lipid panel  . TSH    Follow up plan: Return for Medicare Wellness visit within 6 months of turning 66.  An After Visit Summary was printed and given to the patient.

## 2017-04-20 NOTE — Assessment & Plan Note (Signed)
USPSTF grade A and B recommendations reviewed with patient; age-appropriate recommendations, preventive care, screening tests, etc discussed and encouraged; healthy living encouraged; see AVS for patient education given to patient  

## 2017-04-20 NOTE — Assessment & Plan Note (Signed)
Order chest CT

## 2017-04-20 NOTE — Assessment & Plan Note (Signed)
Order DEXA; 1200 mg of calcium daily divided up 2-3 servings

## 2017-04-20 NOTE — Assessment & Plan Note (Signed)
Encouragement given to lose weight 

## 2017-04-20 NOTE — Patient Instructions (Addendum)
Please do call to schedule your bone density study; the number to schedule one at either [facility] or [facility] is [phone] or [phone] Check out the information at familydoctor.org entitled "Nutrition for Weight Loss: What You Need to Know about Fad Diets" Try to lose between 1-2 pounds per week by taking in fewer calories and burning off more calories You can succeed by limiting portions, limiting foods dense in calories and fat, becoming more active, and drinking 8 glasses of water a day (64 ounces) Don't skip meals, especially breakfast, as skipping meals may alter your metabolism Do not use over-the-counter weight loss pills or gimmicks that claim rapid weight loss A healthy BMI (or body mass index) is between 18.5 and 24.9 You can calculate your ideal BMI at the [place] website ClubMonetize.fr

## 2017-04-21 LAB — LIPID PANEL
CHOLESTEROL: 137 mg/dL (ref <200)
HDL: 60 mg/dL (ref >50)
LDL Cholesterol: 62 mg/dL (ref <100)
Total CHOL/HDL Ratio: 2.3 Ratio (ref <5.0)
Triglycerides: 74 mg/dL (ref <150)
VLDL: 15 mg/dL (ref <30)

## 2017-04-21 LAB — COMPLETE METABOLIC PANEL WITH GFR
ALBUMIN: 4.3 g/dL (ref 3.6–5.1)
ALK PHOS: 93 U/L (ref 33–130)
ALT: 18 U/L (ref 6–29)
AST: 16 U/L (ref 10–35)
BUN: 17 mg/dL (ref 7–25)
CALCIUM: 9.8 mg/dL (ref 8.6–10.4)
CO2: 24 mmol/L (ref 20–32)
CREATININE: 0.89 mg/dL (ref 0.50–0.99)
Chloride: 107 mmol/L (ref 98–110)
GFR, Est African American: 79 mL/min (ref >=60)
GFR, Est Non African American: 69 mL/min (ref >=60)
Glucose, Bld: 82 mg/dL (ref 65–99)
Potassium: 4.5 mmol/L (ref 3.5–5.3)
Sodium: 142 mmol/L (ref 135–146)
Total Bilirubin: 0.4 mg/dL (ref 0.2–1.2)
Total Protein: 6.9 g/dL (ref 6.1–8.1)

## 2017-04-21 LAB — TSH: TSH: 1.38 mIU/L

## 2017-04-26 ENCOUNTER — Telehealth: Payer: Self-pay | Admitting: *Deleted

## 2017-04-26 LAB — PAP IG AND HPV HIGH-RISK: HPV DNA HIGH RISK: NOT DETECTED

## 2017-04-26 NOTE — Telephone Encounter (Signed)
Left message for patient to notify them that it is time to schedule annual low dose lung cancer screening CT scan. Instructed patient to call back to verify information prior to the scan being scheduled.  

## 2017-05-03 ENCOUNTER — Ambulatory Visit
Admission: RE | Admit: 2017-05-03 | Discharge: 2017-05-03 | Disposition: A | Discharge status: Home / Self Care | Payer: BLUE CROSS/BLUE SHIELD | Source: Ambulatory Visit | Attending: Family Medicine | Admitting: Family Medicine

## 2017-05-03 DIAGNOSIS — Z1231 Encounter for screening mammogram for malignant neoplasm of breast: Secondary | ICD-10-CM

## 2017-05-16 DIAGNOSIS — G4733 Obstructive sleep apnea (adult) (pediatric): Secondary | ICD-10-CM | POA: Diagnosis not present

## 2017-05-17 ENCOUNTER — Telehealth: Payer: Self-pay | Admitting: *Deleted

## 2017-05-17 DIAGNOSIS — Z122 Encounter for screening for malignant neoplasm of respiratory organs: Secondary | ICD-10-CM

## 2017-05-17 DIAGNOSIS — Z87891 Personal history of nicotine dependence: Secondary | ICD-10-CM

## 2017-05-17 NOTE — Telephone Encounter (Signed)
Left message for patient to notify them that it is time to schedule annual low dose lung cancer screening CT scan. Instructed patient to call back to verify information prior to the scan being scheduled.  

## 2017-05-17 NOTE — Telephone Encounter (Signed)
Notified patient that annual lung cancer screening low dose CT scan is due currently or will be in near future. Confirmed that patient is within the age range of 55-77, and asymptomatic, (no signs or symptoms of lung cancer). Patient denies illness that would prevent curative treatment for lung cancer if found. Verified smoking history, (current, 30.75pack year). The shared decision making visit was done 05/03/16. Patient is agreeable for CT scan being scheduled.

## 2017-05-31 ENCOUNTER — Ambulatory Visit
Admission: RE | Admit: 2017-05-31 | Discharge: 2017-05-31 | Disposition: A | Discharge status: Home / Self Care | Payer: BLUE CROSS/BLUE SHIELD | Source: Ambulatory Visit | Attending: Oncology | Admitting: Oncology

## 2017-05-31 DIAGNOSIS — Z87891 Personal history of nicotine dependence: Secondary | ICD-10-CM | POA: Diagnosis not present

## 2017-05-31 DIAGNOSIS — I2510 Atherosclerotic heart disease of native coronary artery without angina pectoris: Secondary | ICD-10-CM | POA: Insufficient documentation

## 2017-05-31 DIAGNOSIS — Z122 Encounter for screening for malignant neoplasm of respiratory organs: Secondary | ICD-10-CM | POA: Diagnosis not present

## 2017-05-31 DIAGNOSIS — I700 Atherosclerosis of aorta: Secondary | ICD-10-CM | POA: Diagnosis not present

## 2017-05-31 DIAGNOSIS — J439 Emphysema, unspecified: Secondary | ICD-10-CM | POA: Insufficient documentation

## 2017-05-31 DIAGNOSIS — F17210 Nicotine dependence, cigarettes, uncomplicated: Secondary | ICD-10-CM | POA: Diagnosis not present

## 2017-06-05 ENCOUNTER — Encounter: Payer: Self-pay | Admitting: *Deleted

## 2017-08-02 ENCOUNTER — Other Ambulatory Visit: Payer: Self-pay | Admitting: Internal Medicine

## 2017-08-23 DIAGNOSIS — G4733 Obstructive sleep apnea (adult) (pediatric): Secondary | ICD-10-CM | POA: Diagnosis not present

## 2017-09-15 ENCOUNTER — Other Ambulatory Visit: Payer: Self-pay

## 2017-09-15 MED ORDER — TRAZODONE HCL 50 MG PO TABS: 50.0000 mg | ORAL_TABLET | Freq: Every evening | ORAL | 3 refills | Status: DC | PRN

## 2017-09-15 NOTE — Telephone Encounter (Signed)
90 day

## 2017-10-25 ENCOUNTER — Ambulatory Visit
Admission: RE | Admit: 2017-10-25 | Discharge: 2017-10-25 | Disposition: A | Discharge status: Home / Self Care | Payer: BLUE CROSS/BLUE SHIELD | Source: Ambulatory Visit | Attending: Family Medicine | Admitting: Family Medicine

## 2017-10-25 ENCOUNTER — Ambulatory Visit (INDEPENDENT_AMBULATORY_CARE_PROVIDER_SITE_OTHER): Payer: BLUE CROSS/BLUE SHIELD | Admitting: Family Medicine

## 2017-10-25 ENCOUNTER — Encounter: Payer: Self-pay | Admitting: Family Medicine

## 2017-10-25 VITALS — BP 122/78 | HR 97 | Temp 98.4°F | Ht 68.0 in | Wt 213.2 lb

## 2017-10-25 DIAGNOSIS — Z7189 Other specified counseling: Secondary | ICD-10-CM

## 2017-10-25 DIAGNOSIS — E669 Obesity, unspecified: Secondary | ICD-10-CM

## 2017-10-25 DIAGNOSIS — R718 Other abnormality of red blood cells: Secondary | ICD-10-CM | POA: Diagnosis not present

## 2017-10-25 DIAGNOSIS — R05 Cough: Secondary | ICD-10-CM | POA: Insufficient documentation

## 2017-10-25 DIAGNOSIS — I2510 Atherosclerotic heart disease of native coronary artery without angina pectoris: Secondary | ICD-10-CM | POA: Diagnosis not present

## 2017-10-25 DIAGNOSIS — Z720 Tobacco use: Secondary | ICD-10-CM | POA: Diagnosis not present

## 2017-10-25 DIAGNOSIS — Z5181 Encounter for therapeutic drug level monitoring: Secondary | ICD-10-CM

## 2017-10-25 DIAGNOSIS — Z7185 Encounter for immunization safety counseling: Secondary | ICD-10-CM

## 2017-10-25 DIAGNOSIS — R059 Cough, unspecified: Secondary | ICD-10-CM

## 2017-10-25 DIAGNOSIS — I700 Atherosclerosis of aorta: Secondary | ICD-10-CM | POA: Diagnosis not present

## 2017-10-25 DIAGNOSIS — J432 Centrilobular emphysema: Secondary | ICD-10-CM

## 2017-10-25 DIAGNOSIS — M85859 Other specified disorders of bone density and structure, unspecified thigh: Secondary | ICD-10-CM

## 2017-10-25 DIAGNOSIS — E782 Mixed hyperlipidemia: Secondary | ICD-10-CM | POA: Diagnosis not present

## 2017-10-25 LAB — LIPID PANEL
Cholesterol: 113 mg/dL (ref <200)
HDL: 40 mg/dL — AB (ref >50)
LDL CHOLESTEROL (CALC): 54 mg/dL
Non-HDL Cholesterol (Calc): 73 mg/dL (calc) (ref <130)
TRIGLYCERIDES: 103 mg/dL (ref <150)
Total CHOL/HDL Ratio: 2.8 (calc) (ref <5.0)

## 2017-10-25 LAB — CBC WITH DIFFERENTIAL/PLATELET
BASOS ABS: 28 {cells}/uL (ref 0–200)
Basophils Relative: 0.5 %
EOS ABS: 77 {cells}/uL (ref 15–500)
EOS PCT: 1.4 %
HEMATOCRIT: 43 % (ref 35.0–45.0)
HEMOGLOBIN: 14.6 g/dL (ref 11.7–15.5)
LYMPHS ABS: 1551 {cells}/uL (ref 850–3900)
MCH: 31.6 pg (ref 27.0–33.0)
MCHC: 34 g/dL (ref 32.0–36.0)
MCV: 93.1 fL (ref 80.0–100.0)
MPV: 11.4 fL (ref 7.5–12.5)
Monocytes Relative: 9.2 %
NEUTROS ABS: 3339 {cells}/uL (ref 1500–7800)
NEUTROS PCT: 60.7 %
Platelets: 204 10*3/uL (ref 140–400)
RBC: 4.62 10*6/uL (ref 3.80–5.10)
RDW: 12 % (ref 11.0–15.0)
Total Lymphocyte: 28.2 %
WBC: 5.5 10*3/uL (ref 3.8–10.8)
WBCMIX: 506 {cells}/uL (ref 200–950)

## 2017-10-25 LAB — COMPLETE METABOLIC PANEL WITH GFR
AG Ratio: 1.5 (calc) (ref 1.0–2.5)
ALT: 19 U/L (ref 6–29)
AST: 19 U/L (ref 10–35)
Albumin: 4 g/dL (ref 3.6–5.1)
Alkaline phosphatase (APISO): 85 U/L (ref 33–130)
BILIRUBIN TOTAL: 0.5 mg/dL (ref 0.2–1.2)
BUN: 17 mg/dL (ref 7–25)
CHLORIDE: 106 mmol/L (ref 98–110)
CO2: 30 mmol/L (ref 20–32)
Calcium: 9.7 mg/dL (ref 8.6–10.4)
Creat: 0.85 mg/dL (ref 0.50–0.99)
GFR, Est African American: 83 mL/min/{1.73_m2} (ref >=60)
GFR, Est Non African American: 72 mL/min/{1.73_m2} (ref >=60)
Globulin: 2.6 g/dL (calc) (ref 1.9–3.7)
Glucose, Bld: 87 mg/dL (ref 65–99)
Potassium: 4.1 mmol/L (ref 3.5–5.3)
Sodium: 142 mmol/L (ref 135–146)
Total Protein: 6.6 g/dL (ref 6.1–8.1)

## 2017-10-25 MED ORDER — BENZONATATE 100 MG PO CAPS: 100.0000 mg | ORAL_CAPSULE | Freq: Three times a day (TID) | ORAL | 0 refills | Status: DC | PRN

## 2017-10-25 NOTE — Assessment & Plan Note (Signed)
Return for PCV-13

## 2017-10-25 NOTE — Patient Instructions (Addendum)
Please do call to schedule your bone density study; the number to schedule one at either [facility] or [facility] is [phone] or [phone] I do encourage you to quit smoking Call [phone] to sign up for smoking cessation classes You can call 1-800-QUIT-NOW to talk with a smoking cessation coach  Steps to Quit Smoking Smoking tobacco can be bad for your health. It can also affect almost every organ in your body. Smoking puts you and people around you at risk for many serious long-lasting (chronic) diseases. Quitting smoking is hard, but it is one of the best things that you can do for your health. It is never too late to quit. What are the benefits of quitting smoking? When you quit smoking, you lower your risk for getting serious diseases and conditions. They can include:  Lung cancer or lung disease.  Heart disease.  Stroke.  Heart attack.  Not being able to have children (infertility).  Weak bones (osteoporosis) and broken bones (fractures).  If you have coughing, wheezing, and shortness of breath, those symptoms may get better when you quit. You may also get sick less often. If you are pregnant, quitting smoking can help to lower your chances of having a baby of low birth weight. What can I do to help me quit smoking? Talk with your doctor about what can help you quit smoking. Some things you can do (strategies) include:  Quitting smoking totally, instead of slowly cutting back how much you smoke over a period of time.  Going to in-person counseling. You are more likely to quit if you go to many counseling sessions.  Using resources and support systems, such as: ? Database administrator with a Social worker. ? Phone quitlines. ? Careers information officer. ? Support groups or group counseling. ? Text messaging programs. ? Mobile phone apps or applications.  Taking medicines. Some of these medicines may have nicotine in them. If you are pregnant  or breastfeeding, do not take any medicines to quit smoking unless your doctor says it is okay. Talk with your doctor about counseling or other things that can help you.  Talk with your doctor about using more than one strategy at the same time, such as taking medicines while you are also going to in-person counseling. This can help make quitting easier. What things can I do to make it easier to quit? Quitting smoking might feel very hard at first, but there is a lot that you can do to make it easier. Take these steps:  Talk to your family and friends. Ask them to support and encourage you.  Call phone quitlines, reach out to support groups, or work with a Social worker.  Ask people who smoke to not smoke around you.  Avoid places that make you want (trigger) to smoke, such as: ? Bars. ? Parties. ? Smoke-break areas at work.  Spend time with people who do not smoke.  Lower the stress in your life. Stress can make you want to smoke. Try these things to help your stress: ? Getting regular exercise. ? Deep-breathing exercises. ? Yoga. ? Meditating. ? Doing a body scan. To do this, close your eyes, focus on one area of your body at a time from head to toe, and notice which parts of your body are tense. Try to relax the muscles in those areas.  Download or buy apps on your mobile phone or tablet that can help you stick to your quit plan. There are many free  apps, such as QuitGuide from the [employer] Office manager for Disease Control and Prevention). You can find more support from smokefree.gov and other websites.  This information is not intended to replace advice given to you by your health care provider. Make sure you discuss any questions you have with your health care provider. Document Released: 06/04/2009 Document Revised: 04/05/2016 Document Reviewed: 12/23/2014 Elsevier Interactive Patient Education  2018 [place] out the information at [employer].org entitled "Nutrition for Weight  Loss: What You Need to Know about Fad Diets" Try to lose between 1-2 pounds per week by taking in fewer calories and burning off more calories You can succeed by limiting portions, limiting foods dense in calories and fat, becoming more active, and drinking 8 glasses of water a day (64 ounces) Don't skip meals, especially breakfast, as skipping meals may alter your metabolism Do not use over-the-counter weight loss pills or gimmicks that claim rapid weight loss A healthy BMI (or body mass index) is between 18.5 and 24.9 You can calculate your ideal BMI at the [place] website ClubMonetize.fr  Try vitamin C (orange juice if not diabetic or vitamin C tablets) and drink green tea to help your immune system during your illness Get plenty of rest and hydration If you have not heard anything from my staff in a week about any orders/referrals/studies from today, please contact us here to follow-up [phone]

## 2017-10-25 NOTE — Progress Notes (Signed)
Patient ID: Kathy Gregory, female   DOB: 1951/07/25, 67 y.o.   MRN: [id number]   Subjective:   Kathy Gregory is a 67 y.o. female here for a complete physical exam  Interim issues since last visit: none  High cholesterol; taking statin; no body aches or problems;  Tries to be a good eater, then does bad; ups and downs; had grilled salmon and steamed veggies last night; not many fatty meats; maybe once a month; never hardly eats bacon; some cheese; 2% sliced cheese on sandwiches  CAD; no chest pain; seeing Dr. Saunders Revel, cardiologist; on aspirin; still smoking, but has cut down; five cigarettes over the last few days; thinking about quitting; atherosclerosis aorta; discussed; does not exercise at all; scoliosis and chiropractor, hard to walk very far; walks around the office some  Emphysema; no limitations to lifestyle because of breathing or SHOB; has not had PCV-13  Crud; has been sick for a few days; feels really congested; trying to blow nose and nothing comes out hardly; coughing; ears are stopped up  Osteopenia; had DEXA ordered in August 2018 and just hasn't called for appointment; takes calcium supplement daily with vitamin D  Obesity; it's hard to lose weight she says; she loves food, "I'm a foodaholic"; she doesn't eat a lot of sweets  OSA; using CPAP, using every night  Past Medical History:  Diagnosis Date  . Anxiety   . Arthritis    hips, collarbone  . Depression   . Heart murmur 07/24/2016  . Hyperlipidemia   . Migraines    4-5 per year  . Scoliosis    mild  . Sleep apnea   . Vertigo    2-3x/yr   Past Surgical History:  Procedure Laterality Date  . BREAST CYST ASPIRATION Left   . COLONOSCOPY  2006  . COLONOSCOPY WITH PROPOFOL N/A 05/15/2015   Procedure: COLONOSCOPY WITH PROPOFOL;  Surgeon: Lucilla Lame, MD;  Location: [place];  Service: Endoscopy;  Laterality: N/A;  . POLYPECTOMY  05/15/2015   Procedure: POLYPECTOMY;  Surgeon: Lucilla Lame, MD;   Location: [place];  Service: Endoscopy;;   Family History  Problem Relation Age of Onset  . Hypertension Mother   . Diabetes Mother   . Heart disease Father 51       CABG  . Stroke Father 23       Occurred during CABG  . Breast cancer Paternal Grandmother   . Diabetes Brother   . Hypertension Brother   . Kidney disease Maternal Grandmother   . Cirrhosis Brother    Social History   Tobacco Use  . Smoking status: Current Every Day Smoker    Packs/day: 0.25    Years: 40.00    Pack years: 10.00    Types: Cigarettes  . Smokeless tobacco: Never Used  . Tobacco comment: Trying to cut back  Substance Use Topics  . Alcohol use: Yes    Alcohol/week: 0.6 oz    Types: 1 Glasses of wine per week    Comment: occ  . Drug use: No   Review of Systems  Objective:   Vitals:   10/25/17 0831  BP: 122/78  Pulse: 97  Temp: 98.4 F (36.9 C)  TempSrc: Oral  SpO2: 97%  Weight: 213 lb 3.2 oz (96.7 kg)  Height: 5\' 8"  (1.727 m)   Body mass index is 32.42 kg/m. Wt Readings from Last 3 Encounters:  10/25/17 213 lb 3.2 oz (96.7 kg)  05/31/17 212 lb (96.2 kg)  04/20/17 217 lb 8 oz (98.7 kg)   Physical Exam  Constitutional: She appears well-developed and well-nourished. No distress.  HENT:  Head: Normocephalic and atraumatic.  Eyes: EOM are normal. No scleral icterus.  Neck: No thyromegaly present.  Cardiovascular: Normal rate, regular rhythm and normal heart sounds.  No murmur heard. Pulmonary/Chest: Effort normal. She has no decreased breath sounds. She has no wheezes. She has rhonchi (scattered).  Abdominal: Soft. Bowel sounds are normal. She exhibits no distension.  Musculoskeletal: She exhibits no edema.  Neurological: She is alert. She exhibits normal muscle tone.  Skin: Skin is warm and dry. She is not diaphoretic. No pallor.  Psychiatric: She has a normal mood and affect. Her behavior is normal. Judgment and thought content normal.    Assessment/Plan:    Problem List Items Addressed This Visit      Cardiovascular and Mediastinum   Coronary artery disease (Chronic)    Seeing cardiologist; no chest pain; continue aspirin      Aortic atherosclerosis (HCC) (Chronic)    Goal LDL is less than 70; continue aspirin and statin        Respiratory   Centrilobular emphysema (HCC) (Chronic)    Offer PCV-13; she'll return in a few weeks for PCV-13; smoking cessation discussed      Relevant Medications   benzonatate (TESSALON PERLES) 100 MG capsule     Musculoskeletal and Integument   Osteopenia (Chronic)    Recommended DEXA scan; patient to schedule; continue calcium and vitamin D; practice good fall precautions        Other   Vaccine counseling    Return for PCV-13      Tobacco abuse (Chronic)    Encouraged cessation      Obesity (BMI 30.0-34.9) (Chronic)    Has thought about losing weight      Medication monitoring encounter    Check liver and kidneys      Relevant Orders   CBC with Differential/Platelet   COMPLETE METABOLIC PANEL WITH GFR   HLD (hyperlipidemia) (Chronic)    Limit saturated fats; check lipids today; continue statin      Relevant Orders   Lipid panel   Elevated hematocrit    Recheck; chronic hypoxemia from cigarettes or OSA      Relevant Orders   CBC with Differential/Platelet    Other Visit Diagnoses    Cough    -  Primary   Relevant Orders   DG Chest 2 View       Meds ordered this encounter  Medications  . benzonatate (TESSALON PERLES) 100 MG capsule    Sig: Take 1 capsule (100 mg total) by mouth every 8 (eight) hours as needed for cough.    Dispense:  30 capsule    Refill:  0   Orders Placed This Encounter  Procedures  . DG Chest 2 View    Standing Status:   Future    Standing Expiration Date:   12/26/2018    Order Specific Question:   Reason for Exam (SYMPTOM  OR DIAGNOSIS REQUIRED)    Answer:   cough after viral illness, r/o pneumonia; smoker    Order Specific Question:    Preferred imaging location?    Answer:   [facility]  . CBC with Differential/Platelet  . COMPLETE METABOLIC PANEL WITH GFR  . Lipid panel    Follow up plan: Return in about 6 months (around 04/27/2018) for follow-up visit with Dr. Sanda Klein; physical when due.  An After Visit Summary was  printed and given to the patient.

## 2017-10-25 NOTE — Assessment & Plan Note (Signed)
Offer PCV-13; she'll return in a few weeks for PCV-13; smoking cessation discussed

## 2017-10-25 NOTE — Assessment & Plan Note (Signed)
Has thought about losing weight

## 2017-10-25 NOTE — Assessment & Plan Note (Signed)
Recheck; chronic hypoxemia from cigarettes or OSA

## 2017-10-25 NOTE — Assessment & Plan Note (Signed)
Seeing cardiologist; no chest pain; continue aspirin

## 2017-10-25 NOTE — Assessment & Plan Note (Signed)
Limit saturated fats; check lipids today; continue statin 

## 2017-10-25 NOTE — Progress Notes (Signed)
  Patient ID: Kathy Gregory, female   DOB: 02-Nov-1950, 67 y.o.   MRN: [id number]   Subjective:    Review of Systems  Objective:   Physical Exam  Assessment/Plan:

## 2017-10-25 NOTE — Assessment & Plan Note (Signed)
Check liver and kidneys 

## 2017-10-25 NOTE — Assessment & Plan Note (Signed)
Recommended DEXA scan; patient to schedule; continue calcium and vitamin D; practice good fall precautions

## 2017-10-25 NOTE — Assessment & Plan Note (Signed)
Encouraged cessation.

## 2017-10-25 NOTE — Assessment & Plan Note (Signed)
Goal LDL is less than 70; continue aspirin and statin

## 2017-10-25 NOTE — Progress Notes (Signed)
Review of Systems  Physical Exam

## 2017-11-01 ENCOUNTER — Telehealth: Payer: Self-pay | Admitting: Internal Medicine

## 2017-11-01 ENCOUNTER — Other Ambulatory Visit: Payer: Self-pay | Admitting: *Deleted

## 2017-11-01 MED ORDER — ATORVASTATIN CALCIUM 20 MG PO TABS: 20.0000 mg | ORAL_TABLET | Freq: Every day | ORAL | 0 refills | Status: DC

## 2017-11-01 NOTE — Telephone Encounter (Signed)
Requested Prescriptions   Signed Prescriptions Disp Refills  . atorvastatin (LIPITOR) 20 MG tablet 30 tablet 0    Sig: Take 1 tablet (20 mg total) by mouth daily.    Authorizing Provider: END, CHRISTOPHER    Ordering User: Britt Bottom

## 2017-11-01 NOTE — Telephone Encounter (Signed)
°*  STAT* If patient is at the pharmacy, call can be transferred to refill team.   1. Which medications need to be refilled? (please list name of each medication and dose if known) Atorvastatin 20 mg po q d  2. Which pharmacy/location (including street and city if local pharmacy) is medication to be sent to? [facility] [address] [place]   3. Do they need a 30 day or 90 day supply? [place]

## 2017-12-07 ENCOUNTER — Other Ambulatory Visit: Payer: Self-pay | Admitting: Internal Medicine

## 2017-12-28 ENCOUNTER — Ambulatory Visit (INDEPENDENT_AMBULATORY_CARE_PROVIDER_SITE_OTHER): Payer: BLUE CROSS/BLUE SHIELD | Admitting: Internal Medicine

## 2017-12-28 ENCOUNTER — Encounter: Payer: Self-pay | Admitting: Internal Medicine

## 2017-12-28 VITALS — BP 105/65 | HR 71 | Ht 67.5 in | Wt 216.0 lb

## 2017-12-28 DIAGNOSIS — R5383 Other fatigue: Secondary | ICD-10-CM

## 2017-12-28 DIAGNOSIS — R002 Palpitations: Secondary | ICD-10-CM | POA: Diagnosis not present

## 2017-12-28 DIAGNOSIS — I2510 Atherosclerotic heart disease of native coronary artery without angina pectoris: Secondary | ICD-10-CM

## 2017-12-28 DIAGNOSIS — R0600 Dyspnea, unspecified: Secondary | ICD-10-CM

## 2017-12-28 DIAGNOSIS — R0609 Other forms of dyspnea: Secondary | ICD-10-CM | POA: Diagnosis not present

## 2017-12-28 DIAGNOSIS — E782 Mixed hyperlipidemia: Secondary | ICD-10-CM

## 2017-12-28 DIAGNOSIS — I2584 Coronary atherosclerosis due to calcified coronary lesion: Secondary | ICD-10-CM

## 2017-12-28 NOTE — Progress Notes (Signed)
Follow-up Outpatient Visit Date: 12/28/2017  Primary Care Provider: Arnetha Courser, MD [address] [place] [address]  Chief Complaint: Follow-up coronary artery calcification  HPI:  Ms. Siple is a 67 y.o. year-old female with history of hyperlipidemia, tobacco use, OSA on CPAP, depression/anxiety, arthritis, migraine headaches, and incidentally noted coronary artery calcification on the lung cancer screening CT, who presents for follow-up of cardiovascular risk.  I last saw her a year ago at which time she was doing well.  Today, Ms. Penagos feels about the same as last year.  She notes some fatigue and decreased energy when doing strenuous activities.  She denies chest pain, shortness of breath at rest, and lightheadedness.  She has stable dyspnea on exertion.  She notes occasional flutters in the chest every few days when she settles down at night.  There are no associated symptoms.  Ms. Hibbitts smokes 7-8 cigarettes/day but is trying to cut down.  --------------------------------------------------------------------------------------------------  Cardiovascular History & Procedures: Cardiovascular Problems:  Coronary artery calcification  Risk Factors:  Coronary artery calcification, hyperlipidemia, and tobacco use  Cath/PCI:  None  CV Surgery:  None  EP Procedures and Devices:  None  Non-Invasive Evaluation(s):  None  Recent CV Pertinent Labs: Lab Results  Component Value Date   CHOL 113 10/25/2017   HDL 40 (L) 10/25/2017   LDLCALC 54 10/25/2017   TRIG 103 10/25/2017   CHOLHDL 2.8 10/25/2017   K 4.1 10/25/2017   BUN 17 10/25/2017   CREATININE 0.85 10/25/2017    Past medical and surgical history were reviewed and updated in EPIC.  Current Meds  Medication Sig  . aspirin EC 81 MG tablet Take 1 tablet (81 mg total) by mouth daily.  Marland Kitchen atorvastatin (LIPITOR) 20 MG tablet TAKE 1 TABLET BY MOUTH EVERY DAY  . calcium-vitamin D (OSCAL WITH D)  250-125 MG-UNIT per tablet Take 1 tablet by mouth daily.  . clonazePAM (KLONOPIN) 0.5 MG tablet Take 0.5 tablets (0.25 mg total) by mouth 2 (two) times daily as needed for anxiety.  . Fish Oil-Cholecalciferol (FISH OIL + D3) 1000-1000 MG-UNIT CAPS Take by mouth daily.   . psyllium (REGULOID) 0.52 G capsule Take 0.52 g by mouth daily.  . traZODone (DESYREL) 50 MG tablet Take 1 tablet (50 mg total) by mouth at bedtime as needed for sleep.    Allergies: Patient has no known allergies.  Social History   Tobacco Use  . Smoking status: Current Every Day Smoker    Packs/day: 0.25    Years: 40.00    Pack years: 10.00    Types: Cigarettes  . Smokeless tobacco: Never Used  . Tobacco comment: Trying to cut back  Substance Use Topics  . Alcohol use: Yes    Alcohol/week: 0.6 oz    Types: 1 Glasses of wine per week    Comment: occ  . Drug use: No    Family History  Problem Relation Age of Onset  . Hypertension Mother   . Diabetes Mother   . Heart disease Father 66       CABG  . Stroke Father 68       Occurred during CABG  . Breast cancer Paternal Grandmother   . Diabetes Brother   . Hypertension Brother   . Kidney disease Maternal Grandmother   . Cirrhosis Brother     Review of Systems: A 12-system review of systems was performed and was negative except as noted in the HPI.  --------------------------------------------------------------------------------------------------  Physical Exam:  BP 105/65 (BP Location: Left Arm, Patient Position: Sitting, Cuff Size: Normal)   Pulse 71   Ht 5' 7.5" (1.715 m)   Wt 216 lb (98 kg)   BMI 33.33 kg/m   General:  NAD HEENT: No conjunctival pallor or scleral icterus. Moist mucous membranes.  OP clear. Neck: Supple without lymphadenopathy, thyromegaly, JVD, or HJR. Lungs: Normal work of breathing. Clear to auscultation bilaterally without wheezes or crackles. Heart: Regular rate and rhythm without murmurs, rubs, or gallops. Non-displaced  PMI. Abd: Bowel sounds present. Soft, NT/ND without hepatosplenomegaly Ext: No lower extremity edema. Radial, PT, and DP pulses are 2+ bilaterally. Skin: Warm and dry without rash.  EKG:  NSR without abnormalities.  Lab Results  Component Value Date   WBC 5.5 10/25/2017   HGB 14.6 10/25/2017   HCT 43.0 10/25/2017   MCV 93.1 10/25/2017   PLT 204 10/25/2017    Lab Results  Component Value Date   NA 142 10/25/2017   K 4.1 10/25/2017   CL 106 10/25/2017   CO2 30 10/25/2017   BUN 17 10/25/2017   CREATININE 0.85 10/25/2017   GLUCOSE 87 10/25/2017   ALT 19 10/25/2017    Lab Results  Component Value Date   CHOL 113 10/25/2017   HDL 40 (L) 10/25/2017   LDLCALC 54 10/25/2017   TRIG 103 10/25/2017   CHOLHDL 2.8 10/25/2017    --------------------------------------------------------------------------------------------------  ASSESSMENT AND PLAN: Coronary artery calcification, dyspnea on exertion, and fatigue Overall, symptoms are stable.  However, despite trying to increase her activity, Ms. Desouza has not noticed much improvement.  This is likely multifactorial, but in the setting of coronary artery calcification and a long history of tobacco use, we have agreed to perform a pharmacologic myocardial perfusion stress test, as Ms. Zellman does not feel like she would be able to walk on a treadmill.  Hyperlipidemia LDL well-controlled.  Continue atorvastatin.  Palpitations Stable and without worrisome symptoms.  EKG today is normal. No testing or intervention at this time.  Follow-up: Return to clinic in 1 year, sooner if stress test is abnormal or new/worsening symptoms develop.  Nelva Bush, MD 12/28/2017 3:04 PM

## 2017-12-28 NOTE — Patient Instructions (Signed)
Medication Instructions:  Your physician recommends that you continue on your current medications as directed. Please refer to the Current Medication list given to you today.   Labwork: None ordered  Testing/Procedures: Your physician has requested that you have a lexiscan myoview. For further information please visit [email or web address]. Please follow instruction sheet, as given.    Follow-Up: Your physician recommends that you schedule a follow-up appointment in: 1 year  Any Other Special Instructions Will Be Listed Below (If Applicable).     If you need a refill on your cardiac medications before your next appointment, please call your pharmacy.  [place]  Your caregiver has ordered a Stress Test with nuclear imaging. The purpose of this test is to evaluate the blood supply to your heart muscle. This procedure is referred to as a "Non-Invasive Stress Test." This is because other than having an IV started in your vein, nothing is inserted or "invades" your body. Cardiac stress tests are done to find areas of poor blood flow to the heart by determining the extent of coronary artery disease (CAD). Some patients exercise on a treadmill, which naturally increases the blood flow to your heart, while others who are  unable to walk on a treadmill due to physical limitations have a pharmacologic/chemical stress agent called Lexiscan . This medicine will mimic walking on a treadmill by temporarily increasing your coronary blood flow.   Please note: these test may take anywhere between 2-4 hours to complete  PLEASE REPORT TO [facility] AT THE FIRST DESK WILL DIRECT YOU WHERE TO GO  Date of Procedure:_____________________________________  Arrival Time for Procedure:______________________________  Instructions regarding medication:   _N/A___ : Hold diabetes medication morning of procedure  _N/A___:  Hold betablocker(s) night before procedure and  morning of procedure  _N/A___:  Hold other medications as follows:_________________________________________________________________________________________________________________________________________________________________________________________________________________________________________________________________________________________  PLEASE NOTIFY THE OFFICE AT LEAST 24 HOURS IN ADVANCE IF YOU ARE UNABLE TO [place].  [phone] AND  PLEASE NOTIFY NUCLEAR MEDICINE AT [facility] AT LEAST 24 HOURS IN ADVANCE IF YOU ARE UNABLE TO KEEP YOUR APPOINTMENT. [phone]  How to prepare for your Myoview test:  1. Do not eat or drink after midnight 2. No caffeine for 24 hours prior to test 3. No smoking 24 hours prior to test. 4. Your medication may be taken with water.  If your doctor stopped a medication because of this test, do not take that medication. 5. Ladies, please do not wear dresses.  Skirts or pants are appropriate. Please wear a short sleeve shirt. 6. No perfume, cologne or lotion. 7. Wear comfortable walking shoes. No heels!

## 2017-12-29 ENCOUNTER — Encounter: Payer: Self-pay | Admitting: Internal Medicine

## 2017-12-29 DIAGNOSIS — R5383 Other fatigue: Secondary | ICD-10-CM | POA: Insufficient documentation

## 2017-12-29 DIAGNOSIS — R0609 Other forms of dyspnea: Secondary | ICD-10-CM

## 2017-12-29 HISTORY — DX: Other fatigue: R53.83

## 2018-01-03 ENCOUNTER — Other Ambulatory Visit: Payer: BLUE CROSS/BLUE SHIELD

## 2018-01-05 ENCOUNTER — Ambulatory Visit
Admission: RE | Admit: 2018-01-05 | Discharge: 2018-01-05 | Disposition: A | Discharge status: Home / Self Care | Payer: BLUE CROSS/BLUE SHIELD | Source: Ambulatory Visit | Attending: Internal Medicine | Admitting: Internal Medicine

## 2018-01-05 DIAGNOSIS — I2584 Coronary atherosclerosis due to calcified coronary lesion: Secondary | ICD-10-CM | POA: Diagnosis not present

## 2018-01-05 DIAGNOSIS — I2510 Atherosclerotic heart disease of native coronary artery without angina pectoris: Secondary | ICD-10-CM

## 2018-01-05 DIAGNOSIS — R5383 Other fatigue: Secondary | ICD-10-CM | POA: Diagnosis not present

## 2018-01-05 LAB — NM MYOCAR MULTI W/SPECT W/WALL MOTION / EF
CHL CUP NUCLEAR SRS: 12
CHL CUP NUCLEAR SSS: 8
LV dias vol: 59 mL (ref 46–106)
LVSYSVOL: 16 mL
NUC STRESS TID: 1
SDS: 0

## 2018-01-05 MED ORDER — TECHNETIUM TC 99M TETROFOSMIN IV KIT
13.9300 | PACK | Freq: Once | INTRAVENOUS | Status: AC | PRN
Start: 2018-01-05 — End: 2018-01-05
  Administered 2018-01-05: 13.93 via INTRAVENOUS

## 2018-01-05 MED ORDER — REGADENOSON 0.4 MG/5ML IV SOLN
0.4000 mg | Freq: Once | INTRAVENOUS | Status: AC
  Administered 2018-01-05: 0.4 mg via INTRAVENOUS

## 2018-01-05 MED ORDER — TECHNETIUM TC 99M TETROFOSMIN IV KIT
30.0000 | PACK | Freq: Once | INTRAVENOUS | Status: AC | PRN
  Administered 2018-01-05: 32.35 via INTRAVENOUS

## 2018-01-07 ENCOUNTER — Other Ambulatory Visit: Payer: Self-pay | Admitting: Internal Medicine

## 2018-02-07 NOTE — Progress Notes (Signed)
[facility]     Assessment and Plan:  Obstructive sleep apnea. --Doing well with CPAP on auto-set with pressure of 8-14. - Discussed importance of using CPAP every night for the whole night.  Some nights she falls asleep in a recliner and does not use it for the entire night.  Nicotine abuse. --Spent > 3 min in counseling.  -Discussed smoking cessation and possibility of starting Chantix or nicotine patches however she is not currently contemplating quitting.   Emphysema. -Seen on most recent CT lung cancer screening, per the radiology report. -On my review of the CT scan. This is minimal to nil.  Anxiety and depression. --Controlled.  Date: 02/07/2018  MRN# [id number] Kathy Gregory [birth year]    Kathy Gregory is a 67 y.o. old female seen in consultation for chief complaint of:    Chief Complaint  Patient presents with  . Sleep Apnea    pt here for cpap compliance. She is doing well DME: [facility].    HPI:  The patient is a 67 year old female with OSA on CPAP. She feels that she is doing well on her PAP. She is using it every night and is no longer sleepy during the day.  She continues to smoke less than half a pack per day.  She feels that her breathing is doing well and she takes no inhalers.   **Review of download data, 5/22- 02/08/2018>> uses greater than 4 hours is 28/30 days.  Average usage on days used is 7 hours 6 minutes.  Pressure ranges 8-14.  Median pressure is 10, 95th percentile pressure is 12, maximum pressure is 13.  Residual AHI is 0.1.  Overall this shows very good compliance with CPAP, with excellent control of obstructive sleep apnea.  **CT 05/03/16>> some strandy atelectasis, but minimal to no emphysema noted.    Medication:   Reviewed    Allergies:  Patient has no known allergies.  Review of Systems: Review of Systems:  Constitutional: Feels well. Cardiovascular: No chest pain.  Pulmonary: Denies dyspnea.   The remainder  of systems were reviewed and were found to be negative other than what is documented in the HPI.      Physical Examination:   VS: BP 116/76 (BP Location: Left Arm, Cuff Size: Large)   Pulse 82   Resp 16   Ht 5' 7.5" (1.715 m)   Wt 216 lb (98 kg)   SpO2 95%   BMI 33.33 kg/m   General Appearance: No distress  Neuro:without focal findings, mental status, speech normal, alert and oriented HEENT: PERRLA, EOM intact Pulmonary: No wheezing, No rales  CardiovascularNormal S1,S2.  No m/r/g.  Abdomen: Benign, Soft, non-tender, No masses Renal:  No costovertebral tenderness  GU:  No performed at this time. Endoc: No evident thyromegaly, no signs of acromegaly or Cushing features Skin:   warm, no rashes, no ecchymosis  Extremities: normal, no cyanosis, clubbing.    LABORATORY PANEL:   CBC No results for input(s): WBC, HGB, HCT, PLT in the last 168 hours. ------------------------------------------------------------------------------------------------------------------  Chemistries  No results for input(s): NA, K, CL, CO2, GLUCOSE, BUN, CREATININE, CALCIUM, MG, AST, ALT, ALKPHOS, BILITOT in the last 168 hours.  Invalid input(s): GFRCGP ------------------------------------------------------------------------------------------------------------------  Cardiac Enzymes No results for input(s): TROPONINI in the last 168 hours. ------------------------------------------------------------  RADIOLOGY:  No results found.     Thank  you for the consultation and for allowing [facility] to assist in the care of your patient. Our  recommendations are noted above.  Please contact us if we can be of further service.  Marda Stalker, M.D., F.C.C.P.  Board Certified in Internal Medicine, Pulmonary Medicine, [place], and Sleep Medicine.  [facility] Office Number: [phone]   02/12/2018

## 2018-02-08 ENCOUNTER — Encounter: Payer: Self-pay | Admitting: Internal Medicine

## 2018-02-12 ENCOUNTER — Ambulatory Visit (INDEPENDENT_AMBULATORY_CARE_PROVIDER_SITE_OTHER): Payer: BLUE CROSS/BLUE SHIELD | Admitting: Internal Medicine

## 2018-02-12 ENCOUNTER — Encounter: Payer: Self-pay | Admitting: Internal Medicine

## 2018-02-12 VITALS — BP 116/76 | HR 82 | Resp 16 | Ht 67.5 in | Wt 216.0 lb

## 2018-02-12 DIAGNOSIS — J449 Chronic obstructive pulmonary disease, unspecified: Secondary | ICD-10-CM

## 2018-02-12 DIAGNOSIS — Z720 Tobacco use: Secondary | ICD-10-CM | POA: Diagnosis not present

## 2018-02-12 DIAGNOSIS — G4733 Obstructive sleep apnea (adult) (pediatric): Secondary | ICD-10-CM

## 2018-02-12 NOTE — Patient Instructions (Signed)
--  Quitting smoking is the most important thing that you can do for your health.  --Quitting smoking will have greater affect on your health than any medicine that we can give you.    Use CPAP every night for the whole night.

## 2018-02-13 ENCOUNTER — Telehealth: Payer: BLUE CROSS/BLUE SHIELD | Admitting: Family

## 2018-02-13 DIAGNOSIS — N390 Urinary tract infection, site not specified: Secondary | ICD-10-CM

## 2018-02-13 DIAGNOSIS — A499 Bacterial infection, unspecified: Secondary | ICD-10-CM

## 2018-02-13 MED ORDER — NITROFURANTOIN MONOHYD MACRO 100 MG PO CAPS: 100.0000 mg | ORAL_CAPSULE | Freq: Two times a day (BID) | ORAL | 0 refills | Status: DC

## 2018-02-13 NOTE — Progress Notes (Signed)

## 2018-03-08 DIAGNOSIS — G4733 Obstructive sleep apnea (adult) (pediatric): Secondary | ICD-10-CM | POA: Diagnosis not present

## 2018-04-05 ENCOUNTER — Other Ambulatory Visit: Payer: Self-pay | Admitting: Internal Medicine

## 2018-04-25 ENCOUNTER — Other Ambulatory Visit: Payer: Self-pay | Admitting: Family Medicine

## 2018-04-25 ENCOUNTER — Encounter: Payer: Self-pay | Admitting: Nurse Practitioner

## 2018-04-25 DIAGNOSIS — Z1231 Encounter for screening mammogram for malignant neoplasm of breast: Secondary | ICD-10-CM

## 2018-04-26 ENCOUNTER — Ambulatory Visit (INDEPENDENT_AMBULATORY_CARE_PROVIDER_SITE_OTHER): Payer: BLUE CROSS/BLUE SHIELD | Admitting: Nurse Practitioner

## 2018-04-26 ENCOUNTER — Encounter: Payer: Self-pay | Admitting: Nurse Practitioner

## 2018-04-26 VITALS — BP 120/84 | HR 70 | Temp 98.1°F | Resp 16 | Ht 67.32 in | Wt 214.5 lb

## 2018-04-26 DIAGNOSIS — Z0000 Encounter for general adult medical examination without abnormal findings: Secondary | ICD-10-CM

## 2018-04-26 DIAGNOSIS — Z23 Encounter for immunization: Secondary | ICD-10-CM | POA: Diagnosis not present

## 2018-04-26 DIAGNOSIS — Z5181 Encounter for therapeutic drug level monitoring: Secondary | ICD-10-CM | POA: Diagnosis not present

## 2018-04-26 DIAGNOSIS — F410 Panic disorder [episodic paroxysmal anxiety] without agoraphobia: Secondary | ICD-10-CM

## 2018-04-26 DIAGNOSIS — Z131 Encounter for screening for diabetes mellitus: Secondary | ICD-10-CM

## 2018-04-26 DIAGNOSIS — H6122 Impacted cerumen, left ear: Secondary | ICD-10-CM

## 2018-04-26 DIAGNOSIS — E782 Mixed hyperlipidemia: Secondary | ICD-10-CM

## 2018-04-26 LAB — COMPLETE METABOLIC PANEL WITH GFR
AG Ratio: 1.6 (calc) (ref 1.0–2.5)
ALBUMIN MSPROF: 4.2 g/dL (ref 3.6–5.1)
ALKALINE PHOSPHATASE (APISO): 90 U/L (ref 33–130)
ALT: 20 U/L (ref 6–29)
AST: 16 U/L (ref 10–35)
BUN: 15 mg/dL (ref 7–25)
CHLORIDE: 103 mmol/L (ref 98–110)
CO2: 28 mmol/L (ref 20–32)
CREATININE: 0.97 mg/dL (ref 0.50–0.99)
Calcium: 10.1 mg/dL (ref 8.6–10.4)
GFR, Est African American: 71 mL/min/{1.73_m2} (ref >=60)
GFR, Est Non African American: 61 mL/min/{1.73_m2} (ref >=60)
GLUCOSE: 79 mg/dL (ref 65–99)
Globulin: 2.7 g/dL (calc) (ref 1.9–3.7)
POTASSIUM: 4.1 mmol/L (ref 3.5–5.3)
Sodium: 140 mmol/L (ref 135–146)
Total Bilirubin: 0.4 mg/dL (ref 0.2–1.2)
Total Protein: 6.9 g/dL (ref 6.1–8.1)

## 2018-04-26 LAB — LIPID PANEL
Cholesterol: 138 mg/dL (ref <200)
HDL: 51 mg/dL (ref >50)
LDL CHOLESTEROL (CALC): 69 mg/dL
Non-HDL Cholesterol (Calc): 87 mg/dL (calc) (ref <130)
Total CHOL/HDL Ratio: 2.7 (calc) (ref <5.0)
Triglycerides: 99 mg/dL (ref <150)

## 2018-04-26 MED ORDER — CLONAZEPAM 0.5 MG PO TABS: 0.2500 mg | ORAL_TABLET | Freq: Two times a day (BID) | ORAL | 0 refills | Status: DC | PRN

## 2018-04-26 NOTE — Patient Instructions (Addendum)
For earwax build up use ONE of these over the counter options to soften up with ear wax in your ear: mineral oil drops, docusate (liquid), debrox. Place a 2 drops in your ear while laying on your left side- Keep laying for 30-60 minutes on that side after application. Can use once daily to soften ear wax. Wash in shower afterwards,  . do not stick anything in your ear as it further impact the ear wax   Kegel Exercises Kegel exercises help strengthen the muscles that support the rectum, vagina, small intestine, bladder, and uterus. Doing Kegel exercises can help:  Improve bladder and bowel control.  Improve sexual response.  Reduce problems and discomfort during pregnancy.  Kegel exercises involve squeezing your pelvic floor muscles, which are the same muscles you squeeze when you try to stop the flow of urine. The exercises can be done while sitting, standing, or lying down, but it is best to vary your position. Phase 1 exercises 1. Squeeze your pelvic floor muscles tight. You should feel a tight lift in your rectal area. If you are a female, you should also feel a tightness in your vaginal area. Keep your stomach, buttocks, and legs relaxed. 2. Hold the muscles tight for up to 10 seconds. 3. Relax your muscles. Repeat this exercise 50 times a day or as many times as told by your health care provider. Continue to do this exercise for at least 4-6 weeks or for as long as told by your health care provider. This information is not intended to replace advice given to you by your health care provider. Make sure you discuss any questions you have with your health care provider. Document Released: 07/25/2012 Document Revised: 04/02/2016 Document Reviewed: 06/28/2015 Elsevier Interactive Patient Education  [employer].

## 2018-04-26 NOTE — Progress Notes (Signed)
Name: Kathy Gregory   MRN: [id number]    DOB: 11/18/1950   Date:04/26/2018       Progress Note  Subjective  Chief Complaint  Chief Complaint  Patient presents with  . Annual Exam    HPI  Patient presents for annual CPE .  Diet:  Good days and bad days, limits red meat, eats more fish and chicken, eats salads and steamed vegetables. Sundays has family dinner- probably bad day. Avoids fast food.  1-2 servings of veggies a day and 1 serving of fruits.  Drink: lots of water, and occasionally drinks sweet tea 3-4 times a week. Avoids artificial sweetners  Exercise: still working has a sit down desk but has to get up a lot to do different tasks. Has a treadmill grandson helped clean it up and trying to walk 10 min a day right now.   USPSTF grade A and B recommendations   Depression:  Depression screen [facility] 2/9 10/25/2017 04/20/2017 01/18/2017 07/20/2016 04/18/2016  Decreased Interest 0 0 0 0 0  Down, Depressed, Hopeless 0 1 0 0 1  PHQ - 2 Score 0 1 0 0 1   Hypertension: BP Readings from Last 3 Encounters:  04/26/18 120/84  02/12/18 116/76  12/28/17 105/65   Obesity: Wt Readings from Last 3 Encounters:  04/26/18 214 lb 8 oz (97.3 kg)  02/12/18 216 lb (98 kg)  12/28/17 216 lb (98 kg)   BMI Readings from Last 3 Encounters:  04/26/18 33.27 kg/m  02/12/18 33.33 kg/m  12/28/17 33.33 kg/m    Hep C Screening: completed in 2017 - negative  STD testing and prevention (HIV/chl/gon/syphilis): negative Intimate partner violence:denies  Menstrual History/LMP/Abnormal Bleeding: postmenopausal; no vaginal bleeding Incontinence Symptoms: no; states when she has a full bladder has to go quickly   Advanced Care Planning: A voluntary discussion about advance care planning including the explanation and discussion of advance directives.  Discussed health care proxy and Living will, and the patient was able to identify a health care proxy as daughter Bentlee Drier [phone].  Patient does not have  a living will at present time. If patient does have living will, I have requested they bring this to the clinic to be scanned in to their chart.  Breast cancer: screening mammogram scheduled for 04/28/18 Cervical cancer screening: next due 2023  Osteoporosis Screening: ordered  No results found for: HMDEXASCAN  Lipids:  Lab Results  Component Value Date   CHOL 113 10/25/2017   CHOL 137 04/20/2017   CHOL 127 10/20/2016   Lab Results  Component Value Date   HDL 40 (L) 10/25/2017   HDL 60 04/20/2017   HDL 56 10/20/2016   Lab Results  Component Value Date   LDLCALC 54 10/25/2017   LDLCALC 62 04/20/2017   LDLCALC 55 10/20/2016   Lab Results  Component Value Date   TRIG 103 10/25/2017   TRIG 74 04/20/2017   TRIG 82 10/20/2016   Lab Results  Component Value Date   CHOLHDL 2.8 10/25/2017   CHOLHDL 2.3 04/20/2017   CHOLHDL 2.3 10/20/2016   No results found for: LDLDIRECT  Glucose:  Glucose, Bld  Date Value Ref Range Status  10/25/2017 87 65 - 99 mg/dL Final    Comment:    .            Fasting reference interval .   04/20/2017 82 65 - 99 mg/dL Final  10/20/2016 81 65 - 99 mg/dL Final    Skin cancer: no personal history,  not out in the sun a lot and wears sunscreen and hat, long sleeves  Colorectal cancer: colonoscopy done 2016 next due 2021 Lung cancer:  Smokes 9 cigarettes a day;  Low Dose CT Chest recommended if Age 74-80 years, 30 pack-year currently smoking OR have quit w/in 15years. Patient does qualify.  And gets this yearly   Patient Active Problem List   Diagnosis Date Noted  . Dyspnea on exertion 12/29/2017  . Other fatigue 12/29/2017  . Cervical cancer screening 04/20/2017  . Obesity (BMI 30.0-34.9) 01/18/2017  . Anxiety attack 01/18/2017  . Tobacco abuse 01/18/2017  . Coronary artery calcification 10/27/2016  . Palpitations 10/27/2016  . Medication monitoring encounter 10/20/2016  . Vaccine counseling 07/24/2016  . Heart murmur 07/24/2016  .  Elevated hematocrit 07/20/2016  . Snoring 07/20/2016  . Coronary artery disease 05/23/2016  . Aortic atherosclerosis ([place]) 05/23/2016  . Centrilobular emphysema ([place]) 05/23/2016  . Needs flu shot 04/18/2016  . Preventative health care 04/18/2016  . Breast cancer screening 04/18/2016  . Osteopenia 04/18/2016  . Hx of colonic polyps   . Benign neoplasm of sigmoid colon   . Personal history of tobacco use, presenting hazards to health 04/13/2015  . Annual physical exam 04/13/2015  . Mixed hyperlipidemia 08/07/2014    Past Surgical History:  Procedure Laterality Date  . BREAST CYST ASPIRATION Left   . COLONOSCOPY  2006  . COLONOSCOPY WITH PROPOFOL N/A 05/15/2015   Procedure: COLONOSCOPY WITH PROPOFOL;  Surgeon: Lucilla Lame, MD;  Location: [place];  Service: Endoscopy;  Laterality: N/A;  . POLYPECTOMY  05/15/2015   Procedure: POLYPECTOMY;  Surgeon: Lucilla Lame, MD;  Location: [place];  Service: Endoscopy;;    Family History  Problem Relation Age of Onset  . Hypertension Mother   . Diabetes Mother   . Heart disease Father 29       CABG  . Stroke Father 32       Occurred during CABG  . Breast cancer Paternal Grandmother   . Diabetes Brother   . Hypertension Brother   . Kidney disease Maternal Grandmother   . Cirrhosis Brother     Social History   Socioeconomic History  . Marital status: Widowed    Spouse name: Not on file  . Number of children: Not on file  . Years of education: Not on file  . Highest education level: Not on file  Occupational History  . Not on file  Social Needs  . Financial resource strain: Not hard at all  . Food insecurity:    Worry: Never true    Inability: Never true  . Transportation needs:    Medical: No    Non-medical: No  Tobacco Use  . Smoking status: Current Every Day Smoker    Packs/day: 0.25    Years: 40.00    Pack years: 10.00    Types: Cigarettes  . Smokeless tobacco: Never Used  . Tobacco comment:  Trying to cut back  Substance and Sexual Activity  . Alcohol use: Yes    Alcohol/week: 1.0 standard drinks    Types: 1 Glasses of wine per week    Comment: occ  . Drug use: No  . Sexual activity: Never  Lifestyle  . Physical activity:    Days per week: 7 days    Minutes per session: 10 min  . Stress: Only a little  Relationships  . Social connections:    Talks on phone: More than three times a week  Gets together: Twice a week    Attends religious service: Never    Active member of club or organization: No    Attends meetings of clubs or organizations: Never    Relationship status: Widowed  . Intimate partner violence:    Fear of current or ex partner: No    Emotionally abused: No    Physically abused: No    Forced sexual activity: No  Other Topics Concern  . Not on file  Social History Narrative  . Not on file     Current Outpatient Medications:  .  aspirin EC 81 MG tablet, Take 1 tablet (81 mg total) by mouth daily., Disp: 90 tablet, Rfl: 3 .  atorvastatin (LIPITOR) 20 MG tablet, TAKE 1 TABLET BY MOUTH EVERY DAY, Disp: 90 tablet, Rfl: 2 .  clonazePAM (KLONOPIN) 0.5 MG tablet, Take 0.5 tablets (0.25 mg total) by mouth 2 (two) times daily as needed for anxiety., Disp: 5 tablet, Rfl: 0 .  Fish Oil-Cholecalciferol (FISH OIL + D3) 1000-1000 MG-UNIT CAPS, Take by mouth daily. , Disp: , Rfl:  .  Multiple Minerals-Vitamins (CALCIUM & VIT D3 BONE HEALTH PO), Take 2 tablets by mouth daily. Calcium 600mg - Vitamin D 400mg , Disp: , Rfl:  .  psyllium (REGULOID) 0.52 G capsule, Take 0.52 g by mouth daily., Disp: , Rfl:  .  traZODone (DESYREL) 50 MG tablet, Take 1 tablet (50 mg total) by mouth at bedtime as needed for sleep., Disp: 90 tablet, Rfl: 3  No Known Allergies   Review of Systems  Constitutional: Negative for chills, fever and malaise/fatigue.  HENT: Negative for congestion, sinus pain and sore throat.   Eyes: Negative for blurred vision (sees eye doctor) and double  vision.  Respiratory: Negative for cough and shortness of breath.   Cardiovascular: Negative for chest pain and palpitations.  Gastrointestinal: Negative for abdominal pain, constipation, diarrhea, nausea and vomiting.  Genitourinary: Negative for dysuria.  Musculoskeletal: Positive for back pain and joint pain.  Skin: Negative for rash.  Neurological: Negative for dizziness, tingling and headaches.  Psychiatric/Behavioral: Negative for depression. The patient is nervous/anxious (states has panic attacks, and take clonazapam but tries nonpharm  methods first). The patient does not have insomnia.     Objective  Vitals:   04/26/18 0844  BP: 120/84  Pulse: 70  Resp: 16  Temp: 98.1 F (36.7 C)  TempSrc: Oral  SpO2: 96%  Weight: 214 lb 8 oz (97.3 kg)  Height: 5' 7.32" (1.71 m)    Body mass index is 33.27 kg/m.  Physical Exam  Constitutional: Vital signs are normal. She appears well-developed and well-nourished. She is cooperative.  HENT:  Head: Normocephalic.  Right Ear: Hearing, tympanic membrane, external ear and ear canal normal.  Left Ear: Hearing and external ear normal.  Nose: No mucosal edema, sinus tenderness or septal deviation. Right sinus exhibits no maxillary sinus tenderness and no frontal sinus tenderness. Left sinus exhibits no maxillary sinus tenderness and no frontal sinus tenderness.  Left ear cerumen impacted  Eyes: Pupils are equal, round, and reactive to light. Conjunctivae and EOM are normal.  Neck: Trachea normal. Neck supple. No JVD present. Carotid bruit is not present. No thyroid mass and no thyromegaly present.  Cardiovascular: Normal rate and normal heart sounds. Decreased pulses: no.  Pulses:      Radial pulses are 2+ on the right side, and 2+ on the left side.  no Lower extremity edema   Pulmonary/Chest: Effort normal and breath sounds normal. No respiratory  distress. She exhibits no mass, no tenderness, no edema and no retraction. Right breast  exhibits no mass, no nipple discharge, no skin change and no tenderness. Left breast exhibits no mass, no nipple discharge, no skin change and no tenderness.  Abdominal: Soft. Normal appearance and bowel sounds are normal. There is no hepatosplenomegaly. There is no tenderness. There is no CVA tenderness.  Musculoskeletal: Normal range of motion. She exhibits deformity (mild scoliosis).  Pain with certain movements and lower back   Lymphadenopathy:    She has no cervical adenopathy.  Neurological: She is alert. She has normal strength. No sensory deficit. She displays a negative Romberg sign. GCS eye subscore is 4. GCS verbal subscore is 5. GCS motor subscore is 6.  Skin: Skin is warm, dry and intact. Capillary refill takes less than 2 seconds. No erythema. Nails show no clubbing.  Psychiatric: She has a normal mood and affect. Her speech is normal and behavior is normal. Judgment and thought content normal.     No results found for this or any previous visit (from the past 2160 hour(s)).    Fall Risk: Fall Risk  10/25/2017 04/20/2017 01/18/2017 07/20/2016 04/18/2016  Falls in the past year? No No No Yes No  Number falls in past yr: - - - 1 -  Injury with Fall? - - - No -     Functional Status Survey: Is the patient deaf or have difficulty hearing?: No Does the patient have difficulty seeing, even when wearing glasses/contacts?: No Does the patient have difficulty concentrating, remembering, or making decisions?: No Does the patient have difficulty walking or climbing stairs?: No Does the patient have difficulty dressing or bathing?: No Does the patient have difficulty doing errands alone such as visiting a doctor's office or shopping?: No   Assessment & Plan  1. Preventative health care Discussed healthy living, patient is working on losing weight, increasing activity- somewhat limited by back pain.   2. Mixed hyperlipidemia Discussed diet, continue meds; will monitor  - Lipid  Profile  3. Medication monitoring encounter - COMPLETE METABOLIC PANEL WITH GFR  4. Need for influenza vaccination - Flu vaccine HIGH DOSE PF (Fluzone High dose)  5. Need for pneumococcal vaccination - Pneumococcal conjugate vaccine 13-valent  6. Screening for diabetes mellitus - COMPLETE METABOLIC PANEL WITH GFR  7. Panic attacks PMP aware checked, controlled substance agreement signed. Pt has used 5 pills in 2 years.  - clonazePAM (KLONOPIN) 0.5 MG tablet; Take 0.5 tablets (0.25 mg total) by mouth 2 (two) times daily as needed for anxiety.  Dispense: 5 tablet; Refill: 0  8. Left ear impacted cerumen Patient ear impacted with hardened cerumen recommended OTC otic preps to soften cerumen and can irrigate or return here for removal.    Will call do add on DEXA scan to mammogram -USPSTF grade A and B recommendations reviewed with patient; age-appropriate recommendations, preventive care, screening tests, etc discussed and encouraged; healthy living encouraged; see AVS for patient education given to patient -Discussed importance of 150 minutes of physical activity weekly, eat two servings of fish weekly, eat one serving of tree nuts ( cashews, pistachios, pecans, almonds.Marland Kitchen) every other day, eat 6 servings of fruit/vegetables daily and drink plenty of water and avoid sweet beverages.   -Reviewed Health Maintenance: we will update immunizations

## 2018-04-27 ENCOUNTER — Ambulatory Visit: Payer: BLUE CROSS/BLUE SHIELD | Admitting: Family Medicine

## 2018-05-08 ENCOUNTER — Ambulatory Visit
Admission: RE | Admit: 2018-05-08 | Discharge: 2018-05-08 | Disposition: A | Discharge status: Home / Self Care | Payer: BLUE CROSS/BLUE SHIELD | Source: Ambulatory Visit | Attending: Family Medicine | Admitting: Family Medicine

## 2018-05-08 DIAGNOSIS — Z1231 Encounter for screening mammogram for malignant neoplasm of breast: Secondary | ICD-10-CM | POA: Diagnosis not present

## 2018-05-22 ENCOUNTER — Encounter: Payer: Self-pay | Admitting: *Deleted

## 2018-05-22 ENCOUNTER — Telehealth: Payer: Self-pay | Admitting: *Deleted

## 2018-05-22 DIAGNOSIS — Z122 Encounter for screening for malignant neoplasm of respiratory organs: Secondary | ICD-10-CM

## 2018-05-22 NOTE — Telephone Encounter (Signed)
Patient has been notified that the annual lung cancer screening low dose CT scan is due currently or will be in the near future.  Confirmed that the patient is within the age range of 55-80, and asymptomatic, and currently exhibits no signs or symptoms of lung cancer.  Patient denies illness that would prevent curative treatment for lung cancer if found.  Verified smoking history, current smoker 1/2 ppd, with 34.5pkyr history .  The shared decision making visit was completed on 05-03-16.  Patient is agreeable for the CT scan to be scheduled.  Will call patient back with date and time of appointment.

## 2018-05-23 ENCOUNTER — Telehealth: Payer: Self-pay | Admitting: *Deleted

## 2018-05-23 NOTE — Telephone Encounter (Signed)
Called patient r/t ldct screening appt for Friday 06/01/2018 @ 1:10pm here @ [facility], message left, appt mailed to patient

## 2018-06-01 ENCOUNTER — Ambulatory Visit
Admission: RE | Admit: 2018-06-01 | Discharge: 2018-06-01 | Disposition: A | Discharge status: Home / Self Care | Payer: BLUE CROSS/BLUE SHIELD | Source: Ambulatory Visit | Attending: Oncology | Admitting: Oncology

## 2018-06-01 DIAGNOSIS — I700 Atherosclerosis of aorta: Secondary | ICD-10-CM | POA: Insufficient documentation

## 2018-06-01 DIAGNOSIS — Z87891 Personal history of nicotine dependence: Secondary | ICD-10-CM | POA: Insufficient documentation

## 2018-06-01 DIAGNOSIS — F17210 Nicotine dependence, cigarettes, uncomplicated: Secondary | ICD-10-CM | POA: Diagnosis not present

## 2018-06-01 DIAGNOSIS — J439 Emphysema, unspecified: Secondary | ICD-10-CM | POA: Diagnosis not present

## 2018-06-01 DIAGNOSIS — I2510 Atherosclerotic heart disease of native coronary artery without angina pectoris: Secondary | ICD-10-CM | POA: Diagnosis not present

## 2018-06-01 DIAGNOSIS — Z122 Encounter for screening for malignant neoplasm of respiratory organs: Secondary | ICD-10-CM | POA: Insufficient documentation

## 2018-06-04 ENCOUNTER — Encounter: Payer: Self-pay | Admitting: *Deleted

## 2018-06-15 ENCOUNTER — Ambulatory Visit (INDEPENDENT_AMBULATORY_CARE_PROVIDER_SITE_OTHER): Payer: BLUE CROSS/BLUE SHIELD

## 2018-06-15 DIAGNOSIS — Z23 Encounter for immunization: Secondary | ICD-10-CM | POA: Diagnosis not present

## 2018-06-15 DIAGNOSIS — G4733 Obstructive sleep apnea (adult) (pediatric): Secondary | ICD-10-CM | POA: Diagnosis not present

## 2018-08-13 ENCOUNTER — Ambulatory Visit: Payer: BLUE CROSS/BLUE SHIELD

## 2018-08-27 ENCOUNTER — Ambulatory Visit (INDEPENDENT_AMBULATORY_CARE_PROVIDER_SITE_OTHER): Payer: BLUE CROSS/BLUE SHIELD

## 2018-08-27 DIAGNOSIS — Z23 Encounter for immunization: Secondary | ICD-10-CM

## 2018-09-02 ENCOUNTER — Other Ambulatory Visit: Payer: Self-pay | Admitting: Family Medicine

## 2018-09-02 NOTE — Telephone Encounter (Signed)
Too soon for trazodone Rx

## 2018-10-26 ENCOUNTER — Ambulatory Visit (INDEPENDENT_AMBULATORY_CARE_PROVIDER_SITE_OTHER): Payer: BLUE CROSS/BLUE SHIELD | Admitting: Family Medicine

## 2018-10-26 ENCOUNTER — Encounter: Payer: Self-pay | Admitting: Family Medicine

## 2018-10-26 DIAGNOSIS — I2510 Atherosclerotic heart disease of native coronary artery without angina pectoris: Secondary | ICD-10-CM | POA: Diagnosis not present

## 2018-10-26 DIAGNOSIS — E782 Mixed hyperlipidemia: Secondary | ICD-10-CM

## 2018-10-26 DIAGNOSIS — Z5181 Encounter for therapeutic drug level monitoring: Secondary | ICD-10-CM | POA: Diagnosis not present

## 2018-10-26 DIAGNOSIS — Z9989 Dependence on other enabling machines and devices: Secondary | ICD-10-CM | POA: Insufficient documentation

## 2018-10-26 DIAGNOSIS — I700 Atherosclerosis of aorta: Secondary | ICD-10-CM

## 2018-10-26 DIAGNOSIS — Z720 Tobacco use: Secondary | ICD-10-CM | POA: Diagnosis not present

## 2018-10-26 DIAGNOSIS — G4733 Obstructive sleep apnea (adult) (pediatric): Secondary | ICD-10-CM

## 2018-10-26 DIAGNOSIS — G4730 Sleep apnea, unspecified: Secondary | ICD-10-CM | POA: Insufficient documentation

## 2018-10-26 DIAGNOSIS — E669 Obesity, unspecified: Secondary | ICD-10-CM

## 2018-10-26 DIAGNOSIS — J432 Centrilobular emphysema: Secondary | ICD-10-CM

## 2018-10-26 DIAGNOSIS — F410 Panic disorder [episodic paroxysmal anxiety] without agoraphobia: Secondary | ICD-10-CM

## 2018-10-26 MED ORDER — CLONAZEPAM 0.5 MG PO TABS: 0.2500 mg | ORAL_TABLET | Freq: Two times a day (BID) | ORAL | 0 refills | Status: DC | PRN

## 2018-10-26 MED ORDER — TRAZODONE HCL 50 MG PO TABS: 50.0000 mg | ORAL_TABLET | Freq: Every evening | ORAL | 3 refills | Status: DC | PRN

## 2018-10-26 NOTE — Assessment & Plan Note (Signed)
Managed by pulmonologist 

## 2018-10-26 NOTE — Progress Notes (Signed)
BP 124/80   Pulse 83   Temp 98.1 F (36.7 C) (Oral)   Resp 14   Ht 5\' 7"  (1.702 m)   Wt 219 lb 4.8 oz (99.5 kg)   SpO2 97%   BMI 34.35 kg/m    Subjective:    Patient ID: Kathy Gregory, female    DOB: March 17, 1951, 68 y.o.   MRN: [id number]  HPI: Kathy Gregory is a 68 y.o. female  Chief Complaint  Patient presents with  . Follow-up  . Medication Refill    HPI Patient is here for follow-up He/she specifically denies travel to/from and exposure to others who have traveled to/from [place], [place], [place], [place], or [place] She has not had any excitement since last visit [facility], seeing Dr. Ashby Dawes; last visit June 2019; wearing mask every night and can tell a difference Coronary artery calcification, reviewed last cardiologist May 2019, no changes to medicines or plan; little bit of fatigue and decreased energy noted in his note; nothing that hampers her, still working; had a stress test Jan 05, 2018:  There was no ST segment deviation noted during stress.  No T wave inversion was noted during stress.  The study is normal.  This is a low risk study.  The left ventricular ejection fraction is normal (55-65%).  Smoking; she has cut back a lot, about 6-7 cigarettes a day; she says she loves her cigarettes; she likes it and enjoys it; not quite ready to quit High cholesterol; cardiologist increased her statin; not checked at cardiology office  Lab Results  Component Value Date   CHOL 138 04/26/2018   HDL 51 04/26/2018   LDLCALC 69 04/26/2018   TRIG 99 04/26/2018   CHOLHDL 2.7 04/26/2018   Obesity; weight fluctuates; she would like to weigh 180 pounds if she could; lose 30-40 pounds, down to 170 to 180 pounds; "pretty good for me"  Last colonoscopy 2016; document says next in 5 years  Depression screen [facility] 2/9 10/26/2018 10/25/2017 04/20/2017 01/18/2017 07/20/2016  Decreased Interest 0 0 0 0 0  Down, Depressed, Hopeless 0 0 1 0 0  PHQ - 2 Score 0 0 1 0 0  Altered  sleeping 0 - - - -  Tired, decreased energy 0 - - - -  Change in appetite 0 - - - -  Feeling bad or failure about yourself  0 - - - -  Trouble concentrating 0 - - - -  Moving slowly or fidgety/restless 0 - - - -  Suicidal thoughts 0 - - - -  PHQ-9 Score 0 - - - -  Difficult doing work/chores Not difficult at all - - - -   Fall Risk  10/26/2018 10/25/2017 04/20/2017 01/18/2017 07/20/2016  Falls in the past year? 0 No No No Yes  Number falls in past yr: 0 - - - 1  Injury with Fall? 0 - - - No   Relevant past medical, surgical, family and social history reviewed Past Medical History:  Diagnosis Date  . Anxiety   . Arthritis    hips, collarbone  . Depression   . Heart murmur 07/24/2016  . Hyperlipidemia   . Migraines    4-5 per year  . Scoliosis    mild  . Sleep apnea   . Vertigo    2-3x/yr   Past Surgical History:  Procedure Laterality Date  . BREAST CYST ASPIRATION Left   . COLONOSCOPY  2006  . COLONOSCOPY WITH PROPOFOL N/A 05/15/2015  Procedure: COLONOSCOPY WITH PROPOFOL;  Surgeon: Lucilla Lame, MD;  Location: [place];  Service: Endoscopy;  Laterality: N/A;  . POLYPECTOMY  05/15/2015   Procedure: POLYPECTOMY;  Surgeon: Lucilla Lame, MD;  Location: [place];  Service: Endoscopy;;   Family History  Problem Relation Age of Onset  . Hypertension Mother   . Diabetes Mother   . Heart disease Father 77       CABG  . Stroke Father 48       Occurred during CABG  . Breast cancer Paternal Grandmother   . Diabetes Brother   . Hypertension Brother   . Kidney disease Maternal Grandmother   . Cirrhosis Brother    Social History   Tobacco Use  . Smoking status: Current Every Day Smoker    Packs/day: 0.25    Years: 46.00    Pack years: 11.50    Types: Cigarettes  . Smokeless tobacco: Never Used  . Tobacco comment: Trying to cut back about 7 per day  Substance Use Topics  . Alcohol use: Yes    Alcohol/week: 1.0 standard drinks    Types: 1 Glasses of wine  per week    Comment: occ  . Drug use: No     Office Visit from 10/26/2018 in [facility]  AUDIT-C Score  0      Interim medical history since last visit reviewed. Allergies and medications reviewed  Review of Systems  Constitutional: Negative for unexpected weight change.  Cardiovascular: Negative for chest pain and leg swelling.   Per HPI unless specifically indicated above     Objective:    BP 124/80   Pulse 83   Temp 98.1 F (36.7 C) (Oral)   Resp 14   Ht 5\' 7"  (1.702 m)   Wt 219 lb 4.8 oz (99.5 kg)   SpO2 97%   BMI 34.35 kg/m   Wt Readings from Last 3 Encounters:  10/26/18 219 lb 4.8 oz (99.5 kg)  06/01/18 216 lb (98 kg)  04/26/18 214 lb 8 oz (97.3 kg)    Physical Exam Constitutional:      General: She is not in acute distress.    Appearance: She is well-developed. She is obese. She is not diaphoretic.  HENT:     Head: Normocephalic and atraumatic.  Eyes:     General: No scleral icterus. Neck:     Thyroid: No thyromegaly.  Cardiovascular:     Rate and Rhythm: Normal rate and regular rhythm.     Heart sounds: Normal heart sounds. No murmur.  Pulmonary:     Effort: Pulmonary effort is normal. No respiratory distress.     Breath sounds: Normal breath sounds. No wheezing.  Abdominal:     General: Bowel sounds are normal. There is no distension.     Palpations: Abdomen is soft.  Musculoskeletal:     Right lower leg: No edema.     Left lower leg: No edema.  Skin:    General: Skin is warm and dry.     Coloration: Skin is not pale.  Neurological:     Mental Status: She is alert.  Psychiatric:        Behavior: Behavior normal.        Thought Content: Thought content normal.        Judgment: Judgment normal.     Results for orders placed or performed in visit on 04/26/18  Lipid Profile  Result Value Ref Range   Cholesterol 138 <200 mg/dL  HDL 51 >50 mg/dL   Triglycerides 99 <150 mg/dL   LDL Cholesterol (Calc) 69 mg/dL (calc)    Total CHOL/HDL Ratio 2.7 <5.0 (calc)   Non-HDL Cholesterol (Calc) 87 <130 mg/dL (calc)  COMPLETE METABOLIC PANEL WITH GFR  Result Value Ref Range   Glucose, Bld 79 65 - 99 mg/dL   BUN 15 7 - 25 mg/dL   Creat 0.97 0.50 - 0.99 mg/dL   GFR, Est Non African American 61 > OR = 60 mL/min/1.8m2   GFR, Est African American 71 > OR = 60 mL/min/1.44m2   BUN/Creatinine Ratio NOT APPLICABLE 6 - 22 (calc)   Sodium 140 135 - 146 mmol/L   Potassium 4.1 3.5 - 5.3 mmol/L   Chloride 103 98 - 110 mmol/L   CO2 28 20 - 32 mmol/L   Calcium 10.1 8.6 - 10.4 mg/dL   Total Protein 6.9 6.1 - 8.1 g/dL   Albumin 4.2 3.6 - 5.1 g/dL   Globulin 2.7 1.9 - 3.7 g/dL (calc)   AG Ratio 1.6 1.0 - 2.5 (calc)   Total Bilirubin 0.4 0.2 - 1.2 mg/dL   Alkaline phosphatase (APISO) 90 33 - 130 U/L   AST 16 10 - 35 U/L   ALT 20 6 - 29 U/L      Assessment & Plan:   Problem List Items Addressed This Visit      Cardiovascular and Mediastinum   Coronary artery disease (Chronic)    Seeing Dr. Saunders Revel for that; negative stress test May 2019; smoking cessation encouraged; weight loss encouraged; goal LDL less than 70; taking aspirin daily      Aortic atherosclerosis ([facility]) (Chronic)    Goal LDL less than 70; weight loss, lipids to manage        Respiratory   Sleep apnea    Managed by pulmonologist      Centrilobular emphysema ([place]) (Chronic)    Seeing pulmonologist        Other   Tobacco abuse (Chronic)    Encouraged to quit; respect her honesty; getting yearly chest CTs for lung cancer screening      Obesity (BMI 30.0-34.9) (Chronic)    See AVS; discussed risk of breast cancer; offered nutritionist referral; think about hurdles standing in her way of reaching desired weight      Relevant Orders   Amb ref to Medical Nutrition Therapy-[facility]   Mixed hyperlipidemia (Chronic)    Encouraged diet low in saturated fats; check lipids      Relevant Orders   Amb ref to Medical Nutrition Therapy-[facility]   Lipid panel    Medication monitoring encounter    Check liver and kidneys      Relevant Orders   COMPLETE METABOLIC PANEL WITH GFR   Anxiety attack    Very limited Rx for benzo; never ever drink alcohol within six hours of benzo      Relevant Medications   traZODone (DESYREL) 50 MG tablet    Other Visit Diagnoses    Panic attacks       Relevant Medications   traZODone (DESYREL) 50 MG tablet   clonazePAM (KLONOPIN) 0.5 MG tablet       Follow up plan: No follow-ups on file.  An after-visit summary was printed and given to the patient at [place].  Please see the patient instructions which may contain other information and recommendations beyond what is mentioned above in the assessment and plan.  Meds ordered this encounter  Medications  . traZODone (DESYREL) 50 MG tablet  Sig: Take 1 tablet (50 mg total) by mouth at bedtime as needed for sleep.    Dispense:  90 tablet    Refill:  3  . clonazePAM (KLONOPIN) 0.5 MG tablet    Sig: Take 0.5 tablets (0.25 mg total) by mouth 2 (two) times daily as needed for anxiety. Never take within six hours of alcohol    Dispense:  5 tablet    Refill:  0    Orders Placed This Encounter  Procedures  . COMPLETE METABOLIC PANEL WITH GFR  . Lipid panel  . Amb ref to Medical Nutrition Therapy-[facility]

## 2018-10-26 NOTE — Assessment & Plan Note (Signed)
Seeing Dr. Saunders Revel for that; negative stress test May 2019; smoking cessation encouraged; weight loss encouraged; goal LDL less than 70; taking aspirin daily

## 2018-10-26 NOTE — Patient Instructions (Addendum)
Check out the information at familydoctor.org entitled "Nutrition for Weight Loss: What You Need to Know about Fad Diets" Try to lose between 1-2 pounds per week by taking in fewer calories and burning off more calories You can succeed by limiting portions, limiting foods dense in calories and fat, becoming more active, and drinking 8 glasses of water a day (64 ounces) Don't skip meals, especially breakfast, as skipping meals may alter your metabolism Do not use over-the-counter weight loss pills or gimmicks that claim rapid weight loss A healthy BMI (or body mass index) is between 18.5 and 24.9 You can calculate your ideal BMI at the [place] website ClubMonetize.fr  I do encourage you to quit smoking Call [phone] to sign up for smoking cessation classes You can call 1-800-QUIT-NOW to talk with a smoking cessation coach   Obesity, Adult Obesity is the condition of having too much total body fat. Being overweight or obese means that your weight is greater than what is considered healthy for your body size. Obesity is determined by a measurement called BMI. BMI is an estimate of body fat and is calculated from height and weight. For adults, a BMI of 30 or higher is considered obese. Obesity can eventually lead to other health concerns and major illnesses, including:  Stroke.  Coronary artery disease (CAD).  Type 2 diabetes.  Some types of cancer, including cancers of the colon, breast, uterus, and gallbladder.  Osteoarthritis.  High blood pressure (hypertension).  High cholesterol.  Sleep apnea.  Gallbladder stones.  Infertility problems. What are the causes? The main cause of obesity is taking in (consuming) more calories than your body uses for energy. Other factors that contribute to this condition may include:  Being born with genes that make you more likely to become obese.  Having a medical condition that causes  obesity. These conditions include: ? Hypothyroidism. ? Polycystic ovarian syndrome (PCOS). ? Binge-eating disorder. ? Cushing syndrome.  Taking certain medicines, such as steroids, antidepressants, and seizure medicines.  Not being physically active (sedentary lifestyle).  Living where there are limited places to exercise safely or buy healthy foods.  Not getting enough sleep. What increases the risk? The following factors may increase your risk of this condition:  Having a family history of obesity.  Being a woman of African-American descent.  Being a man of Hispanic descent. What are the signs or symptoms? Having excessive body fat is the main symptom of this condition. How is this diagnosed? This condition may be diagnosed based on:  Your symptoms.  Your medical history.  A physical exam. Your health care provider may measure: ? Your BMI. If you are an adult with a BMI between 25 and less than 30, you are considered overweight. If you are an adult with a BMI of 30 or higher, you are considered obese. ? The distances around your hips and your waist (circumferences). These may be compared to each other to help diagnose your condition. ? Your skinfold thickness. Your health care provider may gently pinch a fold of your skin and measure it. How is this treated? Treatment for this condition often includes changing your lifestyle. Treatment may include some or all of the following:  Dietary changes. Work with your health care provider and a dietitian to set a weight-loss goal that is healthy and reasonable for you. Dietary changes may include eating: ? Smaller portions. A portion size is the amount of a particular food that is healthy for you to eat at one time.  This varies from person to person. ? Low-calorie or low-fat options. ? More whole grains, fruits, and vegetables.  Regular physical activity. This may include aerobic activity (cardio) and strength training.  Medicine  to help you lose weight. Your health care provider may prescribe medicine if you are unable to lose 1 pound a week after 6 weeks of eating more healthily and doing more physical activity.  Surgery. Surgical options may include gastric banding and gastric bypass. Surgery may be done if: ? Other treatments have not helped to improve your condition. ? You have a BMI of 40 or higher. ? You have life-threatening health problems related to obesity. Follow these instructions at home:  Eating and drinking   Follow recommendations from your health care provider about what you eat and drink. Your health care provider may advise you to: ? Limit fast foods, sweets, and processed snack foods. ? Choose low-fat options, such as low-fat milk instead of whole milk. ? Eat 5 or more servings of fruits or vegetables every day. ? Eat at home more often. This gives you more control over what you eat. ? Choose healthy foods when you eat out. ? Learn what a healthy portion size is. ? Keep low-fat snacks on hand. ? Avoid sugary drinks, such as soda, fruit juice, iced tea sweetened with sugar, and flavored milk. ? Eat a healthy breakfast.  Drink enough water to keep your urine clear or pale yellow.  Do not go without eating for long periods of time (do not fast) or follow a fad diet. Fasting and fad diets can be unhealthy and even dangerous. Physical Activity  Exercise regularly, as told by your health care provider. Ask your health care provider what types of exercise are safe for you and how often you should exercise.  Warm up and stretch before being active.  Cool down and stretch after being active.  Rest between periods of activity. Lifestyle  Limit the time that you spend in front of your TV, computer, or video game system.  Find ways to reward yourself that do not involve food.  Limit alcohol intake to no more than 1 drink a day for nonpregnant women and 2 drinks a day for men. One drink equals  12 oz of beer, 5 oz of wine, or 1 oz of hard liquor. General instructions  Keep a weight loss journal to keep track of the food you eat and how much you exercise you get.  Take over-the-counter and prescription medicines only as told by your health care provider.  Take vitamins and supplements only as told by your health care provider.  Consider joining a support group. Your health care provider may be able to recommend a support group.  Keep all follow-up visits as told by your health care provider. This is important. Contact a health care provider if:  You are unable to meet your weight loss goal after 6 weeks of dietary and lifestyle changes. This information is not intended to replace advice given to you by your health care provider. Make sure you discuss any questions you have with your health care provider. Document Released: 09/15/2004 Document Revised: 01/11/2016 Document Reviewed: 05/27/2015 Elsevier Interactive Patient Education  2019 [employer]  Preventing Unhealthy [employer], Adult Staying at a healthy weight is important to your overall health. When fat builds up in your body, you may become overweight or obese. Being overweight or obese increases your risk of developing certain health problems, such as heart disease, diabetes, sleeping  problems, joint problems, and some types of cancer. Unhealthy weight gain is often the result of making unhealthy food choices or not getting enough exercise. You can make changes to your lifestyle to prevent obesity and stay as healthy as possible. What nutrition changes can be made?   Eat only as much as your body needs. To do this: ? Pay attention to signs that you are hungry or full. Stop eating as soon as you feel full. ? If you feel hungry, try drinking water first before eating. Drink enough water so your urine is clear or pale yellow. ? Eat smaller portions. Pay attention to portion sizes when eating out. ? Look at serving  sizes on food labels. Most foods contain more than one serving per container. ? Eat the recommended number of calories for your gender and activity level. For most active people, a daily total of 2,000 calories is appropriate. If you are trying to lose weight or are not very active, you may need to eat fewer calories. Talk with your health care provider or a diet and nutrition specialist (dietitian) about how many calories you need each day.  Choose healthy foods, such as: ? Fruits and vegetables. At each meal, try to fill at least half of your plate with fruits and vegetables. ? Whole grains, such as whole-wheat bread, [place] rice, and quinoa. ? Lean meats, such as chicken or fish. ? Other healthy proteins, such as beans, eggs, or tofu. ? Healthy fats, such as nuts, seeds, fatty fish, and olive oil. ? Low-fat or fat-free dairy products.  Check food labels, and avoid food and drinks that: ? Are high in calories. ? Have added sugar. ? Are high in sodium. ? Have saturated fats or trans fats.  Cook foods in healthier ways, such as by baking, broiling, or grilling.  Make a meal plan for the week, and shop with a grocery list to help you stay on track with your purchases. Try to avoid going to the grocery store when you are hungry.  When grocery shopping, try to shop around the outside of the store first, where the fresh foods are. Doing this helps you to avoid prepackaged foods, which can be high in sugar, salt (sodium), and fat. What lifestyle changes can be made?   Exercise for 30 or more minutes on 5 or more days each week. Exercising may include brisk walking, yard work, biking, running, swimming, and team sports like basketball and soccer. Ask your health care provider which exercises are safe for you.  Do muscle-strengthening activities, such as lifting weights or using resistance bands, on 2 or more days a week.  Do not use any products that contain nicotine or tobacco, such as  cigarettes and e-cigarettes. If you need help quitting, ask your health care provider.  Limit alcohol intake to no more than 1 drink a day for nonpregnant women and 2 drinks a day for men. One drink equals 12 oz of beer, 5 oz of wine, or 1 oz of hard liquor.  Try to get 7-9 hours of sleep each night. What other changes can be made?  Keep a food and activity journal to keep track of: ? What you ate and how many calories you had. Remember to count the calories in sauces, dressings, and side dishes. ? Whether you were active, and what exercises you did. ? Your calorie, weight, and activity goals.  Check your weight regularly. Track any changes. If you notice you have gained weight, make  changes to your diet or activity routine.  Avoid taking weight-loss medicines or supplements. Talk to your health care provider before starting any new medicine or supplement.  Talk to your health care provider before trying any new diet or exercise plan. Why are these changes important? Eating healthy, staying active, and having healthy habits can help you to prevent obesity. Those changes also:  Help you manage stress and emotions.  Help you connect with friends and family.  Improve your self-esteem.  Improve your sleep.  Prevent long-term health problems. What can happen if changes are not made? Being obese or overweight can cause you to develop joint or bone problems, which can make it hard for you to stay active or do activities you enjoy. Being obese or overweight also puts stress on your heart and lungs and can lead to health problems like diabetes, heart disease, and some cancers. Where to find more information Talk with your health care provider or a dietitian about healthy eating and healthy lifestyle choices. You may also find information from:  [place]. Department of Agriculture, MyPlate: FormerBoss.no  American Heart Association: www.heart.org  Centers for Disease Control and  Prevention: http://www.wolf.info/ Summary  Staying at a healthy weight is important to your overall health. It helps you to prevent certain diseases and health problems, such as heart disease, diabetes, joint problems, sleep disorders, and some types of cancer.  Being obese or overweight can cause you to develop joint or bone problems, which can make it hard for you to stay active or do activities you enjoy.  You can prevent unhealthy weight gain by eating a healthy diet, exercising regularly, not smoking, limiting alcohol, and getting enough sleep.  Talk with your health care provider or a dietitian for guidance about healthy eating and healthy lifestyle choices. This information is not intended to replace advice given to you by your health care provider. Make sure you discuss any questions you have with your health care provider. Document Released: 08/09/2016 Document Revised: 05/19/2017 Document Reviewed: 09/14/2016 Elsevier Interactive Patient Education  2019 [place] with Quitting Smoking  Quitting smoking is a physical and mental challenge. You will face cravings, withdrawal symptoms, and temptation. Before quitting, work with your health care provider to make a plan that can help you cope. Preparation can help you quit and keep you from giving in. How can I cope with cravings? Cravings usually last for 5-10 minutes. If you get through it, the craving will pass. Consider taking the following actions to help you cope with cravings:  Keep your mouth busy: ? Chew sugar-free gum. ? Suck on hard candies or a straw. ? Brush your teeth.  Keep your hands and body busy: ? Immediately change to a different activity when you feel a craving. ? Squeeze or play with a ball. ? Do an activity or a hobby, like making bead jewelry, practicing needlepoint, or working with wood. ? Mix up your normal routine. ? Take a short exercise break. Go for a quick walk or run up and down stairs. ? Spend  time in public places where smoking is not allowed.  Focus on doing something kind or helpful for someone else.  Call a friend or family member to talk during a craving.  Join a support group.  Call a quit line, such as 1-800-QUIT-NOW.  Talk with your health care provider about medicines that might help you cope with cravings and make quitting easier for you. How can I deal with withdrawal symptoms?  Your body may experience negative effects as it tries to get used to not having nicotine in the system. These effects are called withdrawal symptoms. They may include:  Feeling hungrier than normal.  Trouble concentrating.  Irritability.  Trouble sleeping.  Feeling depressed.  Restlessness and agitation.  Craving a cigarette. To manage withdrawal symptoms:  Avoid places, people, and activities that trigger your cravings.  Remember why you want to quit.  Get plenty of sleep.  Avoid coffee and other caffeinated drinks. These may worsen some of your symptoms. How can I handle social situations? Social situations can be difficult when you are quitting smoking, especially in the first few weeks. To manage this, you can:  Avoid parties, bars, and other social situations where people might be smoking.  Avoid alcohol.  Leave right away if you have the urge to smoke.  Explain to your family and friends that you are quitting smoking. Ask for understanding and support.  Plan activities with friends or family where smoking is not an option. What are some ways I can cope with stress? Wanting to smoke may cause stress, and stress can make you want to smoke. Find ways to manage your stress. Relaxation techniques can help. For example:  Breathe slowly and deeply, in through your nose and out through your mouth.  Listen to soothing, relaxing music.  Talk with a family member or friend about your stress.  Light a candle.  Soak in a bath or take a shower.  Think about a peaceful  place. What are some ways I can prevent weight gain? Be aware that many people gain weight after they quit smoking. However, not everyone does. To keep from gaining weight, have a plan in place before you quit and stick to the plan after you quit. Your plan should include:  Having healthy snacks. When you have a craving, it may help to: ? Eat plain popcorn, crunchy carrots, celery, or other cut vegetables. ? Chew sugar-free gum.  Changing how you eat: ? Eat small portion sizes at meals. ? Eat 4-6 small meals throughout the day instead of 1-2 large meals a day. ? Be mindful when you eat. Do not watch television or do other things that might distract you as you eat.  Exercising regularly: ? Make time to exercise each day. If you do not have time for a long workout, do short bouts of exercise for 5-10 minutes several times a day. ? Do some form of strengthening exercise, like weight lifting, and some form of aerobic exercise, like running or swimming.  Drinking plenty of water or other low-calorie or no-calorie drinks. Drink 6-8 glasses of water daily, or as much as instructed by your health care provider. Summary  Quitting smoking is a physical and mental challenge. You will face cravings, withdrawal symptoms, and temptation to smoke again. Preparation can help you as you go through these challenges.  You can cope with cravings by keeping your mouth busy (such as by chewing gum), keeping your body and hands busy, and making calls to family, friends, or a helpline for people who want to quit smoking.  You can cope with withdrawal symptoms by avoiding places where people smoke, avoiding drinks with caffeine, and getting plenty of rest.  Ask your health care provider about the different ways to prevent weight gain, avoid stress, and handle social situations. This information is not intended to replace advice given to you by your health care provider. Make sure you discuss any questions you  have  with your health care provider. Document Released: 08/05/2016 Document Revised: 08/05/2016 Document Reviewed: 08/05/2016 Elsevier Interactive Patient Education  2019 [employer].

## 2018-10-26 NOTE — Assessment & Plan Note (Signed)
Seeing pulmonologist 

## 2018-10-26 NOTE — Assessment & Plan Note (Signed)
Goal LDL less than 70; weight loss, lipids to manage

## 2018-10-26 NOTE — Assessment & Plan Note (Signed)
Encouraged diet low in saturated fats; check lipids 

## 2018-10-26 NOTE — Assessment & Plan Note (Signed)
Encouraged to quit; respect her honesty; getting yearly chest CTs for lung cancer screening

## 2018-10-26 NOTE — Assessment & Plan Note (Addendum)
See AVS; discussed risk of breast cancer; offered nutritionist referral; think about hurdles standing in her way of reaching desired weight

## 2018-10-26 NOTE — Assessment & Plan Note (Signed)
Very limited Rx for benzo; never ever drink alcohol within six hours of benzo

## 2018-10-26 NOTE — Assessment & Plan Note (Signed)
Check liver and kidneys 

## 2018-10-27 LAB — LIPID PANEL
CHOLESTEROL: 133 mg/dL (ref <200)
HDL: 52 mg/dL (ref >=50)
LDL Cholesterol (Calc): 65 mg/dL (calc)
Non-HDL Cholesterol (Calc): 81 mg/dL (calc) (ref <130)
Total CHOL/HDL Ratio: 2.6 (calc) (ref <5.0)
Triglycerides: 78 mg/dL (ref <150)

## 2018-10-27 LAB — COMPLETE METABOLIC PANEL WITH GFR
AG Ratio: 1.5 (calc) (ref 1.0–2.5)
ALBUMIN MSPROF: 4.1 g/dL (ref 3.6–5.1)
ALT: 19 U/L (ref 6–29)
AST: 16 U/L (ref 10–35)
Alkaline phosphatase (APISO): 94 U/L (ref 37–153)
BUN: 18 mg/dL (ref 7–25)
CALCIUM: 9.9 mg/dL (ref 8.6–10.4)
CHLORIDE: 105 mmol/L (ref 98–110)
CO2: 27 mmol/L (ref 20–32)
Creat: 0.85 mg/dL (ref 0.50–0.99)
GFR, EST AFRICAN AMERICAN: 83 mL/min/{1.73_m2} (ref >=60)
GFR, Est Non African American: 71 mL/min/{1.73_m2} (ref >=60)
GLUCOSE: 80 mg/dL (ref 65–99)
Globulin: 2.7 g/dL (calc) (ref 1.9–3.7)
POTASSIUM: 4.4 mmol/L (ref 3.5–5.3)
Sodium: 141 mmol/L (ref 135–146)
Total Bilirubin: 0.4 mg/dL (ref 0.2–1.2)
Total Protein: 6.8 g/dL (ref 6.1–8.1)

## 2018-11-08 DIAGNOSIS — G4733 Obstructive sleep apnea (adult) (pediatric): Secondary | ICD-10-CM | POA: Diagnosis not present

## 2019-01-07 ENCOUNTER — Ambulatory Visit: Payer: BLUE CROSS/BLUE SHIELD | Admitting: Dietician

## 2019-01-10 ENCOUNTER — Other Ambulatory Visit: Payer: Self-pay | Admitting: *Deleted

## 2019-01-10 ENCOUNTER — Telehealth: Payer: Self-pay | Admitting: *Deleted

## 2019-01-10 MED ORDER — ATORVASTATIN CALCIUM 20 MG PO TABS: 20.0000 mg | ORAL_TABLET | Freq: Every day | ORAL | 0 refills | Status: DC

## 2019-01-10 NOTE — Telephone Encounter (Signed)
Virtual Visit Pre-Appointment Phone Call  "(Name), I am calling you today to discuss your upcoming appointment. We are currently trying to limit exposure to the virus that causes COVID-19 by seeing patients at home rather than in the office."  1. "What is the BEST phone number to call the day of the visit?" - include this in appointment notes  2. "Do you have or have access to (through a family member/friend) a smartphone with video capability that we can use for your visit?" a. If yes - list this number in appt notes as "cell" (if different from BEST phone #) and list the appointment type as a VIDEO visit in appointment notes b. If no - list the appointment type as a PHONE visit in appointment notes  3. Confirm consent - "In the setting of the current Covid19 crisis, you are scheduled for a (Video) visit with your provider on (01/31/2019) at (10:00 am).  Just as we do with many in-office visits, in order for you to participate in this visit, we must obtain consent.  If you'd like, I can send this to your mychart (if signed up) or email for you to review.  Otherwise, I can obtain your verbal consent now.  All virtual visits are billed to your insurance company just like a normal visit would be.  By agreeing to a virtual visit, we'd like you to understand that the technology does not allow for your provider to perform an examination, and thus may limit your provider's ability to fully assess your condition. If your provider identifies any concerns that need to be evaluated in person, we will make arrangements to do so.  Finally, though the technology is pretty good, we cannot assure that it will always work on either your or our end, and in the setting of a video visit, we may have to convert it to a phone-only visit.  In either situation, we cannot ensure that we have a secure connection.  Are you willing to proceed?" Yes  4. Advise patient to be prepared - "Two hours prior to your appointment, go  ahead and check your blood pressure, pulse, oxygen saturation, and your weight (if you have the equipment to check those) and write them all down. When your visit starts, your provider will ask you for this information. If you have an Apple Watch or Kardia device, please plan to have heart rate information ready on the day of your appointment. Please have a pen and paper handy nearby the day of the visit as well."  5. Give patient instructions for MyChart download to smartphone OR Doximity/Doxy.me as below if video visit (depending on what platform provider is using)  6. Inform patient they will receive a phone call 15 minutes prior to their appointment time (may be from unknown caller ID) so they should be prepared to answer    TELEPHONE CALL NOTE  Kathy Gregory has been deemed a candidate for a follow-up tele-health visit to limit community exposure during the Covid-19 pandemic. I spoke with the patient via phone to ensure availability of phone/video source, confirm preferred email & phone number, and discuss instructions and expectations.  I reminded Kathy Gregory to be prepared with any vital sign and/or heart rhythm information that could potentially be obtained via home monitoring, at the time of her visit. I reminded Kathy Gregory to expect a phone call prior to her visit.  Benn Tarver C, CMA 01/10/2019 11:55 AM   INSTRUCTIONS FOR  DOWNLOADING THE MYCHART APP TO SMARTPHONE  - The patient must first make sure to have activated MyChart and know their login information - If Apple, go to [employer] and type in MyChart in the search bar and download the app. If Android, ask patient to go to [employer] and type in [place] in the search bar and download the app. The app is free but as with any other app downloads, their phone may require them to verify saved payment information or Apple/Android password.  - The patient will need to then log into the app with their MyChart username  and password, and select [facility] as their healthcare provider to link the account. When it is time for your visit, go to the MyChart app, find appointments, and click Begin Video Visit. Be sure to Select Allow for your device to access the Microphone and Camera for your visit. You will then be connected, and your provider will be with you shortly.  **If they have any issues connecting, or need assistance please contact MyChart service desk [phone])**  **If using a computer, in order to ensure the best quality for their visit they will need to use either of the following Internet Browsers: [employer], or Google Chrome**  IF USING DOXIMITY or DOXY.ME - The patient will receive a link just prior to their visit by text.     FULL LENGTH CONSENT FOR TELE-HEALTH VISIT   I hereby voluntarily request, consent and authorize [facility] and its employed or contracted physicians, physician assistants, nurse practitioners or other licensed health care professionals (the Practitioner), to provide me with telemedicine health care services (the "Services") as deemed necessary by the treating Practitioner. I acknowledge and consent to receive the Services by the Practitioner via telemedicine. I understand that the telemedicine visit will involve communicating with the Practitioner through live audiovisual communication technology and the disclosure of certain medical information by electronic transmission. I acknowledge that I have been given the opportunity to request an in-person assessment or other available alternative prior to the telemedicine visit and am voluntarily participating in the telemedicine visit.  I understand that I have the right to withhold or withdraw my consent to the use of telemedicine in the course of my care at any time, without affecting my right to future care or treatment, and that the Practitioner or I may terminate the telemedicine visit at any time. I  understand that I have the right to inspect all information obtained and/or recorded in the course of the telemedicine visit and may receive copies of available information for a reasonable fee.  I understand that some of the potential risks of receiving the Services via telemedicine include:  Marland Kitchen Delay or interruption in medical evaluation due to technological equipment failure or disruption; . Information transmitted may not be sufficient (e.g. poor resolution of images) to allow for appropriate medical decision making by the Practitioner; and/or  . In rare instances, security protocols could fail, causing a breach of personal health information.  Furthermore, I acknowledge that it is my responsibility to provide information about my medical history, conditions and care that is complete and accurate to the best of my ability. I acknowledge that Practitioner's advice, recommendations, and/or decision may be based on factors not within their control, such as incomplete or inaccurate data provided by me or distortions of diagnostic images or specimens that may result from electronic transmissions. I understand that the practice of medicine is not an exact science and that  Practitioner makes no warranties or guarantees regarding treatment outcomes. I acknowledge that I will receive a copy of this consent concurrently upon execution via email to the email address I last provided but may also request a printed copy by calling the office of [facility].    I understand that my insurance will be billed for this visit.   I have read or had this consent read to me. . I understand the contents of this consent, which adequately explains the benefits and risks of the Services being provided via telemedicine.  . I have been provided ample opportunity to ask questions regarding this consent and the Services and have had my questions answered to my satisfaction. . I give my informed consent for the services to be  provided through the use of telemedicine in my medical care  By participating in this telemedicine visit I agree to the above.

## 2019-01-30 NOTE — Progress Notes (Signed)
Virtual Visit via Video Note   This visit type was conducted due to national recommendations for restrictions regarding the COVID-19 Pandemic (e.g. social distancing) in an effort to limit this patient's exposure and mitigate transmission in our community.  Due to her co-morbid illnesses, this patient is at least at moderate risk for complications without adequate follow up.  This format is felt to be most appropriate for this patient at this time.  All issues noted in this document were discussed and addressed.  A limited physical exam was performed with this format.  Please refer to the patient's chart for her consent to telehealth for [facility].   Date:  01/31/2019   ID:  Kathy Gregory, DOB [birth year], MRN [id number]  Patient Location: Home Provider Location: Home  PCP:  Arnetha Courser, MD  Cardiologist:  Nelva Bush, MD Electrophysiologist:  None   Evaluation Performed:  Follow-Up Visit  Chief Complaint:  Follow-up coronary artery calcification  History of Present Illness:    Kathy Gregory is a 68 y.o. female with history of hyperlipidemia, obstructive sleep apnea on CPAP, tobacco use, depression/anxiety, arthritis, migraine headaches, and incidentally noted coronary artery calcification.  We are speaking today for follow-up of coronary atherosclerosis.  I last saw her in 12/2017 at which time she felt relatively well other than longstanding fatigue and decreased energy with strenuous activities.  We agreed to obtain a pharmacologic myocardial perfusion stress test to exclude obstructive CAD.  This was normal without evidence of ischemia.  Today, Kathy Gregory reports that she feels about the same as last year.  She still has some fatigue with activity, which is unchanged.  She denies chest pain, shortness of breath, lightheadedness, and edema.  She has rare palpitations happening once or twice a month that only last a second or two and are without associated symptoms.  She is  tolerating her medications well.  She does not check her blood pressure regularly at home.  She tries to stay active around the house and at work but does not exercise regularly.  The patient does not have symptoms concerning for COVID-19 infection (fever, chills, cough, or new shortness of breath).    Past Medical History:  Diagnosis Date  . Anxiety   . Arthritis    hips, collarbone  . Depression   . Heart murmur 07/24/2016  . Hyperlipidemia   . Migraines    4-5 per year  . Scoliosis    mild  . Sleep apnea   . Vertigo    2-3x/yr   Past Surgical History:  Procedure Laterality Date  . BREAST CYST ASPIRATION Left   . COLONOSCOPY  2006  . COLONOSCOPY WITH PROPOFOL N/A 05/15/2015   Procedure: COLONOSCOPY WITH PROPOFOL;  Surgeon: Lucilla Lame, MD;  Location: [facility];  Service: Endoscopy;  Laterality: N/A;  . POLYPECTOMY  05/15/2015   Procedure: POLYPECTOMY;  Surgeon: Lucilla Lame, MD;  Location[facility];  Service: Endoscopy;;     Current Meds  Medication Sig  . aspirin EC 81 MG tablet Take 1 tablet (81 mg total) by mouth daily.  Marland Kitchen atorvastatin (LIPITOR) 20 MG tablet Take 1 tablet (20 mg total) by mouth daily.  . clonazePAM (KLONOPIN) 0.5 MG tablet Take 0.5 tablets (0.25 mg total) by mouth 2 (two) times daily as needed for anxiety. Never take within six hours of alcohol  . Fish Oil-Cholecalciferol (FISH OIL + D3) 1000-1000 MG-UNIT CAPS Take by mouth daily.   . Multiple Minerals-Vitamins (CALCIUM &  VIT D3 BONE HEALTH PO) Take 2 tablets by mouth daily. Calcium 600mg - Vitamin D 400mg   . psyllium (REGULOID) 0.52 G capsule Take 0.52 g by mouth daily.  . traZODone (DESYREL) 50 MG tablet Take 1 tablet (50 mg total) by mouth at bedtime as needed for sleep.     Allergies:   Patient has no known allergies.   Social History   Tobacco Use  . Smoking status: Current Every Day Smoker    Packs/day: 0.25    Years: 46.00    Pack years: 11.50    Types: Cigarettes  .  Smokeless tobacco: Never Used  . Tobacco comment: Trying to cut back about 7 per day  Substance Use Topics  . Alcohol use: Yes    Alcohol/week: 1.0 standard drinks    Types: 1 Glasses of wine per week    Comment: occ  . Drug use: No     Family Hx: The patient's family history includes Breast cancer in her paternal grandmother; Cirrhosis in her brother; Diabetes in her brother and mother; Heart disease (age of onset: 26) in her father; Hypertension in her brother and mother; Kidney disease in her maternal grandmother; Stroke (age of onset: 40) in her father.  ROS:   Please see the history of present illness.   All other systems reviewed and are negative.   Prior CV studies:   The following studies were reviewed today:  Pharmacologic MPI (01/05/2018): Normal study without ischemia or scar.  LVEF 55 to 65%.  Labs/Other Tests and Data Reviewed:    EKG:  No ECG reviewed.  Recent Labs: 10/26/2018: ALT 19; BUN 18; Creat 0.85; Potassium 4.4; Sodium 141   Recent Lipid Panel Lab Results  Component Value Date/Time   CHOL 133 10/26/2018 09:09 AM   TRIG 78 10/26/2018 09:09 AM   HDL 52 10/26/2018 09:09 AM   CHOLHDL 2.6 10/26/2018 09:09 AM   LDLCALC 65 10/26/2018 09:09 AM    Wt Readings from Last 3 Encounters:  01/31/19 212 lb (96.2 kg)  10/26/18 219 lb 4.8 oz (99.5 kg)  06/01/18 216 lb (98 kg)     Objective:    Vital Signs:  Ht 5\' 7"  (1.702 m)   Wt 212 lb (96.2 kg)   BMI 33.20 kg/m    VITAL SIGNS:  reviewed GEN:  no acute distress  ASSESSMENT & PLAN:    Coronary artery disease: Coronary artery calcification was previously noted on lung cancer screening chest CT.  Kathy Gregory does not have any symptoms to suggest worsening coronary insufficiency.  Myocardial perfusion stress test last year was normal without ischemia or scar.  I do not recommend any further testing at this time.  I suggest continuation of primary prevention, including low-dose aspirin and statin therapy.   Hyperlipidemia: Most recent LDL in March was at goal (65).  We will continue with atorvastatin 20 mg daily.  Palpitations: Infrequent and brief without worrisome symptoms.  No further workup is recommended at this time.  COVID-19 Education: The signs and symptoms of COVID-19 were discussed with the patient and how to seek care for testing (follow up with PCP or arrange E-visit).  The importance of social distancing was discussed today.  Time:   Today, I have spent 10 minutes with the patient with telehealth technology discussing the above problems.     Medication Adjustments/Labs and Tests Ordered: Current medicines are reviewed at length with the patient today.  Concerns regarding medicines are outlined above.   Tests Ordered: None.  Medication Changes: None  Disposition:  Follow up in 1 year(s)  Signed, Nelva Bush, MD  01/31/2019 9:52 AM    [facility]

## 2019-01-31 ENCOUNTER — Telehealth (INDEPENDENT_AMBULATORY_CARE_PROVIDER_SITE_OTHER): Payer: BC Managed Care – PPO | Admitting: Internal Medicine

## 2019-01-31 ENCOUNTER — Other Ambulatory Visit: Payer: Self-pay

## 2019-01-31 VITALS — Ht 67.0 in | Wt 212.0 lb

## 2019-01-31 DIAGNOSIS — E785 Hyperlipidemia, unspecified: Secondary | ICD-10-CM | POA: Diagnosis not present

## 2019-01-31 DIAGNOSIS — R002 Palpitations: Secondary | ICD-10-CM | POA: Diagnosis not present

## 2019-01-31 DIAGNOSIS — I2510 Atherosclerotic heart disease of native coronary artery without angina pectoris: Secondary | ICD-10-CM | POA: Diagnosis not present

## 2019-01-31 NOTE — Patient Instructions (Signed)
Medication Instructions:  Your physician recommends that you continue on your current medications as directed. Please refer to the Current Medication list given to you today.  If you need a refill on your cardiac medications before your next appointment, please call your pharmacy.   Lab work: - None ordered.  If you have labs (blood work) drawn today and your tests are completely normal, you will receive your results only by: Marland Kitchen MyChart Message (if you have MyChart) OR . A paper copy in the mail If you have any lab test that is abnormal or we need to change your treatment, we will call you to review the results.  Testing/Procedures: - None ordered.   Follow-Up: At [facility], you and your health needs are our priority.  As part of our continuing mission to provide you with exceptional heart care, we have created designated Provider Care Teams.  These Care Teams include your primary Cardiologist (physician) and Advanced Practice Providers (APPs -  Physician Assistants and Nurse Practitioners) who all work together to provide you with the care you need, when you need it. You will need a follow up appointment in 12 months.  Please call our office 2 months in advance to schedule this appointment.  You may see DR Harrell Gave END or one of the following Advanced Practice Providers on your designated Care Team:   Murray Hodgkins, NP Christell Faith, PA-C . Marrianne Mood, PA-C

## 2019-02-15 DIAGNOSIS — G4733 Obstructive sleep apnea (adult) (pediatric): Secondary | ICD-10-CM | POA: Diagnosis not present

## 2019-04-08 ENCOUNTER — Other Ambulatory Visit: Payer: Self-pay | Admitting: Internal Medicine

## 2019-04-30 ENCOUNTER — Encounter: Payer: Self-pay | Admitting: Family Medicine

## 2019-04-30 ENCOUNTER — Encounter: Payer: BLUE CROSS/BLUE SHIELD | Admitting: Family Medicine

## 2019-05-03 ENCOUNTER — Other Ambulatory Visit: Payer: Self-pay

## 2019-05-03 ENCOUNTER — Ambulatory Visit (INDEPENDENT_AMBULATORY_CARE_PROVIDER_SITE_OTHER): Payer: BC Managed Care – PPO | Admitting: Family Medicine

## 2019-05-03 ENCOUNTER — Encounter: Payer: Self-pay | Admitting: Family Medicine

## 2019-05-03 VITALS — BP 118/70 | HR 78 | Temp 97.5°F | Resp 14 | Ht 67.0 in | Wt 208.1 lb

## 2019-05-03 DIAGNOSIS — Z23 Encounter for immunization: Secondary | ICD-10-CM | POA: Diagnosis not present

## 2019-05-03 DIAGNOSIS — M8580 Other specified disorders of bone density and structure, unspecified site: Secondary | ICD-10-CM

## 2019-05-03 DIAGNOSIS — Z5181 Encounter for therapeutic drug level monitoring: Secondary | ICD-10-CM

## 2019-05-03 DIAGNOSIS — G4733 Obstructive sleep apnea (adult) (pediatric): Secondary | ICD-10-CM

## 2019-05-03 DIAGNOSIS — Z1239 Encounter for other screening for malignant neoplasm of breast: Secondary | ICD-10-CM | POA: Diagnosis not present

## 2019-05-03 DIAGNOSIS — Z0000 Encounter for general adult medical examination without abnormal findings: Secondary | ICD-10-CM | POA: Diagnosis not present

## 2019-05-03 DIAGNOSIS — E785 Hyperlipidemia, unspecified: Secondary | ICD-10-CM

## 2019-05-03 DIAGNOSIS — F410 Panic disorder [episodic paroxysmal anxiety] without agoraphobia: Secondary | ICD-10-CM

## 2019-05-03 DIAGNOSIS — E669 Obesity, unspecified: Secondary | ICD-10-CM

## 2019-05-03 DIAGNOSIS — E66811 Obesity, class 1: Secondary | ICD-10-CM

## 2019-05-03 DIAGNOSIS — J432 Centrilobular emphysema: Secondary | ICD-10-CM

## 2019-05-03 DIAGNOSIS — Z720 Tobacco use: Secondary | ICD-10-CM

## 2019-05-03 MED ORDER — CLONAZEPAM 0.5 MG PO TABS: 0.2500 mg | ORAL_TABLET | Freq: Two times a day (BID) | ORAL | 0 refills | Status: DC | PRN

## 2019-05-03 NOTE — Patient Instructions (Signed)
Health Maintenance  Topic Date Due  . PNA vac Low Risk Adult (2 of 2 - PPSV23) 04/27/2019  . MAMMOGRAM  05/09/2019  . COLONOSCOPY  05/14/2020  . TETANUS/TDAP  02/06/2022  . INFLUENZA VACCINE  Completed  . DEXA SCAN  Completed  . Hepatitis C Screening  Completed    Preventive Care 60 Years and Older, Female Preventive care refers to lifestyle choices and visits with your health care provider that can promote health and wellness. This includes:  A yearly physical exam. This is also called an annual well check.  Regular dental and eye exams.  Immunizations.  Screening for certain conditions.  Healthy lifestyle choices, such as diet and exercise. What can I expect for my preventive care visit? Physical exam Your health care provider will check:  Height and weight. These may be used to calculate body mass index (BMI), which is a measurement that tells if you are at a healthy weight.  Heart rate and blood pressure.  Your skin for abnormal spots. Counseling Your health care provider may ask you questions about:  Alcohol, tobacco, and drug use.  Emotional well-being.  Home and relationship well-being.  Sexual activity.  Eating habits.  History of falls.  Memory and ability to understand (cognition).  Work and work Statistician.  Pregnancy and menstrual history. What immunizations do I need?  Influenza (flu) vaccine  This is recommended every year. Tetanus, diphtheria, and pertussis (Tdap) vaccine  You may need a Td booster every 10 years. Varicella (chickenpox) vaccine  You may need this vaccine if you have not already been vaccinated. Zoster (shingles) vaccine  You may need this after age 29. Pneumococcal conjugate (PCV13) vaccine  One dose is recommended after age 54. Pneumococcal polysaccharide (PPSV23) vaccine  One dose is recommended after age 9. Measles, mumps, and rubella (MMR) vaccine  You may need at least one dose of MMR if you were born in  1956 or later. You may also need a second dose. Meningococcal conjugate (MenACWY) vaccine  You may need this if you have certain conditions. Hepatitis A vaccine  You may need this if you have certain conditions or if you travel or work in places where you may be exposed to hepatitis A. Hepatitis B vaccine  You may need this if you have certain conditions or if you travel or work in places where you may be exposed to hepatitis B. Haemophilus influenzae type b (Hib) vaccine  You may need this if you have certain conditions. You may receive vaccines as individual doses or as more than one vaccine together in one shot (combination vaccines). Talk with your health care provider about the risks and benefits of combination vaccines. What tests do I need? Blood tests  Lipid and cholesterol levels. These may be checked every 5 years, or more frequently depending on your overall health.  Hepatitis C test.  Hepatitis B test. Screening  Lung cancer screening. You may have this screening every year starting at age 11 if you have a 30-pack-year history of smoking and currently smoke or have quit within the past 15 years.  Colorectal cancer screening. All adults should have this screening starting at age 91 and continuing until age 79. Your health care provider may recommend screening at age 64 if you are at increased risk. You will have tests every 1-10 years, depending on your results and the type of screening test.  Diabetes screening. This is done by checking your blood sugar (glucose) after you have not eaten  for a while (fasting). You may have this done every 1-3 years.  Mammogram. This may be done every 1-2 years. Talk with your health care provider about how often you should have regular mammograms.  BRCA-related cancer screening. This may be done if you have a family history of breast, ovarian, tubal, or peritoneal cancers. Other tests  Sexually transmitted disease (STD) testing.  Bone  density scan. This is done to screen for osteoporosis. You may have this done starting at age 69. Follow these instructions at home: Eating and drinking  Eat a diet that includes fresh fruits and vegetables, whole grains, lean protein, and low-fat dairy products. Limit your intake of foods with high amounts of sugar, saturated fats, and salt.  Take vitamin and mineral supplements as recommended by your health care provider.  Do not drink alcohol if your health care provider tells you not to drink.  If you drink alcohol: ? Limit how much you have to 0-1 drink a day. ? Be aware of how much alcohol is in your drink. In the [place] one drink equals one 12 oz bottle of beer (355 mL), one 5 oz glass of wine (148 mL), or one 1 oz glass of hard liquor (44 mL). Lifestyle  Take daily care of your teeth and gums.  Stay active. Exercise for at least 30 minutes on 5 or more days each week.  Do not use any products that contain nicotine or tobacco, such as cigarettes, e-cigarettes, and chewing tobacco. If you need help quitting, ask your health care provider.  If you are sexually active, practice safe sex. Use a condom or other form of protection in order to prevent STIs (sexually transmitted infections).  Talk with your health care provider about taking a low-dose aspirin or statin. What's next?  Go to your health care provider once a year for a well check visit.  Ask your health care provider how often you should have your eyes and teeth checked.  Stay up to date on all vaccines. This information is not intended to replace advice given to you by your health care provider. Make sure you discuss any questions you have with your health care provider. Document Released: 09/04/2015 Document Revised: 08/02/2018 Document Reviewed: 08/02/2018 Elsevier Patient Education  2020 [employer].

## 2019-05-03 NOTE — Progress Notes (Signed)
Patient: Kathy Gregory, Female    DOB: 09/17/1950, 68 y.o.   MRN: [id number] Kathy Grana, PA-C Visit Date: 05/03/2019  Today's Provider: Delsa Grana, PA-C   Chief Complaint  Patient presents with  . Annual Exam   Subjective:   Annual physical exam:  Kathy Gregory is a 68 y.o. female who presents today for health maintenance and annual & complete physical exam.   Exercise/Activity:  Limited exercise from scoliosis, gait abnormality, she is seeing a chiropractor once a month for this  Diet/nutrition: She eats fairly healthy and balanced diet  She reports she is sleeping well.  Anxiety disorder with "Anxiety attacks" Pt is not on a daily medication.  She typically has a few episodes a month, but she's been having more recently with COVID pandemic.  She uses klonopin for it.  She reports that she has been given very limited supply and she was told she can never have more than 5 pills.  She takes a half a pill if she has an episode particularly when she has panic attacks that wake her from her sleep she feels a racing heart tightness in her chest is very anxious and upset.  She last filled a prescription about 6 months ago and she still has 1-1/2 pills left but she is having more frequent episodes.  Current medical hx of HLD, CAD, aortic atherosclerosis, has seen cardiology and had reassuring stress tests, they are prescribing lipitor which she is taking, compliant with meds, denies SE/myalgias, she states cardiology has said she doesn't have to go back to them, but she would like to.  She has hx of scoliosis chronic back pain and joint pain to hips and knees and she is managing with chiropractor, she has not done physical therapy but she has improved with the chiropractic treatments.  She is slowly building up her activity and endurance by walking the treadmill and doing 5 to 15 minutes at a time  USPSTF grade A and B recommendations - reviewed and addressed today  Depression:   Phq 9 completed today by patient, was reviewed by me with patient in the room, score is  negative, pt feels good PHQ 2/9 Scores 05/03/2019 10/26/2018 10/25/2017 04/20/2017  PHQ - 2 Score 0 0 0 1  PHQ- 9 Score 0 0 - -   Depression screen [facility] 2/9 05/03/2019 10/26/2018 10/25/2017 04/20/2017 01/18/2017  Decreased Interest 0 0 0 0 0  Down, Depressed, Hopeless 0 0 0 1 0  PHQ - 2 Score 0 0 0 1 0  Altered sleeping 0 0 - - -  Tired, decreased energy 0 0 - - -  Change in appetite 0 0 - - -  Feeling bad or failure about yourself  0 0 - - -  Trouble concentrating 0 0 - - -  Moving slowly or fidgety/restless 0 0 - - -  Suicidal thoughts 0 0 - - -  PHQ-9 Score 0 0 - - -  Difficult doing work/chores Not difficult at all Not difficult at all - - -    Hep C Screening: done STD testing and prevention (HIV/chl/gon/syphilis): deferred Intimate partner violence:denies, feels safe Sexual History/Pain during Intercourse: Widowed Menstrual History/LMP/Abnormal Bleeding:  No LMP recorded. Patient is postmenopausal. Incontinence Symptoms:   Mild/rare with coughing/sneezing  Breast cancer:  Due for it - she is going to call and set it up BRCA gene screening:  None done, not indicated Cervical cancer screening:  Aged out, last done in 2018 Family  hx of cancers - breast, ovarian, uterine, colon - none  Osteoporosis:   Hx of  Dexa scan in 2016 with osteopenia   Pt is supplementing with daily calcium/Vit D.  Skin cancer:  Skin CA hx NO  Colorectal cancer:   colonoscopy is UTD  Lung cancer:   Hx of smoking for 47 - 1/4 pack to 1 ppd She is doing low dose CT annually for the past couple years, she does not currently feel like she can quit smoking due to increased stress   Social History   Tobacco Use  . Smoking status: Current Every Day Smoker    Packs/day: 0.50    Years: 47.00    Pack years: 23.50    Types: Cigarettes  . Smokeless tobacco: Never Used  Substance Use Topics  . Alcohol use: Yes     Alcohol/week: 1.0 standard drinks    Types: 1 Glasses of wine per week    Comment: occ     Alcohol screening:   Office Visit from 05/03/2019 in [facility]  AUDIT-C Score  1      ECG:  Per cardiology  Blood pressure/Hypertension: BP Readings from Last 3 Encounters:  05/03/19 118/70  10/26/18 124/80  04/26/18 120/84   Weight/Obesity: Wt Readings from Last 3 Encounters:  05/03/19 208 lb 1.6 oz (94.4 kg)  01/31/19 212 lb (96.2 kg)  10/26/18 219 lb 4.8 oz (99.5 kg)   BMI Readings from Last 3 Encounters:  05/03/19 32.59 kg/m  01/31/19 33.20 kg/m  10/26/18 34.35 kg/m    Lipids:  Lab Results  Component Value Date   CHOL 133 10/26/2018   CHOL 138 04/26/2018   CHOL 113 10/25/2017   Lab Results  Component Value Date   HDL 52 10/26/2018   HDL 51 04/26/2018   HDL 40 (L) 10/25/2017   Lab Results  Component Value Date   LDLCALC 65 10/26/2018   LDLCALC 69 04/26/2018   LDLCALC 54 10/25/2017   Lab Results  Component Value Date   TRIG 78 10/26/2018   TRIG 99 04/26/2018   TRIG 103 10/25/2017   Lab Results  Component Value Date   CHOLHDL 2.6 10/26/2018   CHOLHDL 2.7 04/26/2018   CHOLHDL 2.8 10/25/2017   No results found for: LDLDIRECT Based on the results of lipid panel his/her cardiovascular risk factor ( using [facility] )  in the next 10 years is: The 10-year ASCVD risk score Mikey Bussing DC Brooke Bonito., et al., 2013) is: 8.8%   Values used to calculate the score:     Age: 59 years     Sex: Female     Is Non-Hispanic African American: No     Diabetic: No     Tobacco smoker: Yes     Systolic Blood Pressure: 007 mmHg     Is BP treated: No     HDL Cholesterol: 52 mg/dL     Total Cholesterol: 133 mg/dL Glucose:  Glucose, Bld  Date Value Ref Range Status  10/26/2018 80 65 - 99 mg/dL Final    Comment:    .            Fasting reference interval .   04/26/2018 79 65 - 99 mg/dL Final    Comment:    .            Fasting reference interval .    10/25/2017 87 65 - 99 mg/dL Final    Comment:    .  Fasting reference interval .     Advanced Care Planning:  A voluntary discussion about advance care planning including the explanation and discussion of advance directives.   Discussed health care proxy and Living will, and the patient was able to identify a health care proxy as - sister Colvin Caroli. Patient does not have a living will at present time.    Social History       She  reports that she has been smoking cigarettes. She has a 23.50 pack-year smoking history. She has never used smokeless tobacco. She reports current alcohol use of about 1.0 standard drinks of alcohol per week. She reports that she does not use drugs.       Social History   Socioeconomic History  . Marital status: Widowed    Spouse name: Not on file  . Number of children: Not on file  . Years of education: Not on file  . Highest education level: Not on file  Occupational History  . Not on file  Social Needs  . Financial resource strain: Not hard at all  . Food insecurity    Worry: Never true    Inability: Never true  . Transportation needs    Medical: No    Non-medical: No  Tobacco Use  . Smoking status: Current Every Day Smoker    Packs/day: 0.50    Years: 47.00    Pack years: 23.50    Types: Cigarettes  . Smokeless tobacco: Never Used  Substance and Sexual Activity  . Alcohol use: Yes    Alcohol/week: 1.0 standard drinks    Types: 1 Glasses of wine per week    Comment: occ  . Drug use: No  . Sexual activity: Never  Lifestyle  . Physical activity    Days per week: 7 days    Minutes per session: 10 min  . Stress: Only a little  Relationships  . Social connections    Talks on phone: More than three times a week    Gets together: Twice a week    Attends religious service: Never    Active member of club or organization: No    Attends meetings of clubs or organizations: Never    Relationship status: Widowed  Other Topics  Concern  . Not on file  Social History Narrative  . Not on file    Family History        Family Status  Relation Name Status  . Mother  Deceased       brain tumor,CHF  . Father  Deceased       heart disease  . PGM  Deceased       old age  . Sister  Alive  . Brother  Alive  . Daughter  Alive  . Son  Alive  . MGM  Deceased       kidney disease  . MGF  Deceased       old age  . PGF  Deceased       unknown  . Brother youngest Deceased       chirrosis        Her family history includes Breast cancer in her paternal grandmother; Cirrhosis in her brother; Diabetes in her brother and mother; Heart disease (age of onset: 73) in her father; Hypertension in her brother and mother; Kidney disease in her maternal grandmother; Stroke (age of onset: 75) in her father.       Family History  Problem Relation Age of Onset  .  Hypertension Mother   . Diabetes Mother   . Heart disease Father 80       CABG  . Stroke Father 7       Occurred during CABG  . Breast cancer Paternal Grandmother   . Diabetes Brother   . Hypertension Brother   . Kidney disease Maternal Grandmother   . Cirrhosis Brother     Patient Active Problem List   Diagnosis Date Noted  . Sleep apnea 10/26/2018  . Obesity (BMI 30.0-34.9) 01/18/2017  . Anxiety attack 01/18/2017  . Tobacco abuse 01/18/2017  . Coronary artery disease 05/23/2016  . Aortic atherosclerosis ([place]) 05/23/2016  . Centrilobular emphysema ([place]) 05/23/2016  . Osteopenia 04/18/2016  . Hx of colonic polyps   . Benign neoplasm of sigmoid colon   . Personal history of tobacco use, presenting hazards to health 04/13/2015  . Hyperlipidemia LDL goal <70 08/07/2014    Past Surgical History:  Procedure Laterality Date  . BREAST CYST ASPIRATION Left   . COLONOSCOPY  2006  . COLONOSCOPY WITH PROPOFOL N/A 05/15/2015   Procedure: COLONOSCOPY WITH PROPOFOL;  Surgeon: Lucilla Lame, MD;  Location: [place];  Service: Endoscopy;  Laterality:  N/A;  . POLYPECTOMY  05/15/2015   Procedure: POLYPECTOMY;  Surgeon: Lucilla Lame, MD;  Location: [place];  Service: Endoscopy;;     Current Outpatient Medications:  .  aspirin EC 81 MG tablet, Take 1 tablet (81 mg total) by mouth daily., Disp: 90 tablet, Rfl: 3 .  atorvastatin (LIPITOR) 20 MG tablet, TAKE 1 TABLET BY MOUTH EVERY DAY, Disp: 90 tablet, Rfl: 2 .  clonazePAM (KLONOPIN) 0.5 MG tablet, Take 0.5-1 tablets (0.25-0.5 mg total) by mouth 2 (two) times daily as needed for anxiety. Never take within six hours of alcohol, Disp: 15 tablet, Rfl: 0 .  Fish Oil-Cholecalciferol (FISH OIL + D3) 1000-1000 MG-UNIT CAPS, Take by mouth daily. , Disp: , Rfl:  .  Multiple Minerals-Vitamins (CALCIUM & VIT D3 BONE HEALTH PO), Take 2 tablets by mouth daily. Calcium 621m- Vitamin D 4082m Disp: , Rfl:  .  psyllium (REGULOID) 0.52 G capsule, Take 0.52 g by mouth daily., Disp: , Rfl:  .  traZODone (DESYREL) 50 MG tablet, Take 1 tablet (50 mg total) by mouth at bedtime as needed for sleep., Disp: 90 tablet, Rfl: 3  No Known Allergies  Patient Care Team: TaDelsa GranaPA-C as PCP - General (Family Medicine) RaLaverle HobbyMD as Consulting Physician (Pulmonary Disease) End, ChHarrell GaveMD as Consulting Physician (Cardiology)  I personally reviewed active problem list, medication list, allergies, family history, social history, health maintenance, notes from last encounter, lab results with the patient/caregiver today.  Review of Systems  Constitutional: Negative.  Negative for activity change, appetite change, fatigue and unexpected weight change.  HENT: Negative.   Eyes: Negative.   Respiratory: Negative.  Negative for shortness of breath.   Cardiovascular: Negative.  Negative for chest pain, palpitations and leg swelling.  Gastrointestinal: Negative.  Negative for abdominal pain and blood in stool.  Endocrine: Negative.   Genitourinary: Negative.   Musculoskeletal: Negative.   Negative for arthralgias, gait problem, joint swelling and myalgias.  Skin: Negative.  Negative for color change, pallor and rash.  Allergic/Immunologic: Negative.   Neurological: Negative.  Negative for syncope and weakness.  Hematological: Negative.   Psychiatric/Behavioral: Negative.  Negative for confusion, dysphoric mood, self-injury and suicidal ideas. The patient is not nervous/anxious.           Objective:   Vitals:  Vitals:   05/03/19 0802  BP: 118/70  Pulse: 78  Resp: 14  Temp: (!) 97.5 F (36.4 C)  SpO2: 98%  Weight: 208 lb 1.6 oz (94.4 kg)  Height: _0  (1.702 m)    Body mass index is 32.59 kg/m.  Physical Exam Constitutional:      General: She is not in acute distress.    Appearance: Normal appearance. She is well-developed. She is not toxic-appearing or diaphoretic.  HENT:     Head: Normocephalic and atraumatic.     Right Ear: External ear normal.     Left Ear: External ear normal.     Nose: Nose normal.     Mouth/Throat:     Pharynx: Uvula midline.  Eyes:     General: Lids are normal.     Conjunctiva/sclera: Conjunctivae normal.     Pupils: Pupils are equal, round, and reactive to light.  Neck:     Musculoskeletal: Normal range of motion and neck supple.     Trachea: Phonation normal. No tracheal deviation.  Cardiovascular:     Rate and Rhythm: Normal rate and regular rhythm.     Pulses: Normal pulses.          Radial pulses are 2+ on the right side and 2+ on the left side.       Posterior tibial pulses are 2+ on the right side and 2+ on the left side.     Heart sounds: Normal heart sounds. No murmur. No friction rub. No gallop.   Pulmonary:     Effort: Pulmonary effort is normal. No respiratory distress.     Breath sounds: Normal breath sounds. No stridor. No wheezing, rhonchi or rales.  Chest:     Chest wall: No tenderness.     Breasts: Breasts are symmetrical.        Right: Normal. No swelling, bleeding, inverted nipple, mass, nipple  discharge, skin change or tenderness.        Left: Normal. No swelling, bleeding, inverted nipple, mass, nipple discharge, skin change or tenderness.  Abdominal:     General: Bowel sounds are normal. There is no distension.     Palpations: Abdomen is soft.     Tenderness: There is no abdominal tenderness. There is no guarding or rebound.  Musculoskeletal: Normal range of motion.        General: No deformity.  Lymphadenopathy:     Cervical: No cervical adenopathy.     Upper Body:     Right upper body: No axillary or pectoral adenopathy.     Left upper body: No axillary or pectoral adenopathy.  Skin:    General: Skin is warm and dry.     Capillary Refill: Capillary refill takes less than 2 seconds.     Coloration: Skin is not pale.     Findings: No rash.  Neurological:     Mental Status: She is alert and oriented to person, place, and time.     Motor: No abnormal muscle tone.     Gait: Gait normal.  Psychiatric:        Speech: Speech normal.        Behavior: Behavior normal.      Fall Risk: Fall Risk  05/03/2019 10/26/2018 10/25/2017 04/20/2017 01/18/2017  Falls in the past year? 0 0 No No No  Number falls in past yr: 0 0 - - -  Injury with Fall? 0 0 - - -    Functional Status Survey:     Assessment &  Plan:    CPE completed today  . USPSTF grade A and B recommendations reviewed with patient; age-appropriate recommendations, preventive care, screening tests, etc discussed and encouraged; healthy living encouraged; see AVS for patient education given to patient  . Discussed importance of 150 minutes of physical activity weekly, AHA exercise recommendations given to pt in AVS/handout  . Discussed importance of healthy diet:  eating lean meats and proteins, avoiding trans fats and saturated fats, avoid simple sugars and excessive carbs in diet, eat 6 servings of fruit/vegetables daily and drink plenty of water and avoid sweet beverages.    . Recommended pt to do annual eye exam  and routine dental exams/cleanings  . Depression, alcohol, fall screening completed as documented above and per flowsheets - negative, anxiety addressed today  . Reviewed Health Maintenance: Health Maintenance  Topic Date Due  . MAMMOGRAM  05/09/2019  . COLONOSCOPY  05/14/2020  . TETANUS/TDAP  02/06/2022  . INFLUENZA VACCINE  Completed  . DEXA SCAN  Completed  . Hepatitis C Screening  Completed  . PNA vac Low Risk Adult  Completed    . Immunizations: Immunization History  Administered Date(s) Administered  . Fluad Quad(high Dose 65+) 05/03/2019  . Influenza, High Dose Seasonal PF 04/26/2018  . Influenza,inj,Quad PF,6+ Mos 04/18/2016, 04/20/2017  . Pneumococcal Conjugate-13 04/26/2018  . Pneumococcal Polysaccharide-23 08/19/2013, 05/03/2019  . Tdap 02/07/2012  . Zoster Recombinat (Shingrix) 06/15/2018, 08/27/2018     ICD-10-CM   1. Annual physical exam  Z00.00 CBC with Differential/Platelet    COMPLETE METABOLIC PANEL WITH GFR    Lipid panel  2. Screening for breast cancer  Z12.39 MM 3D SCREEN BREAST BILATERAL  3. Panic attacks  F41.0 clonazePAM (KLONOPIN) 0.5 MG tablet   well controlled with infrequent use of klonopin, slightly worse since COVID, verified meds, gave refill  4. Hyperlipidemia LDL goal <70  X53.6 COMPLETE METABOLIC PANEL WITH GFR    Lipid panel   tolerating meds, managing with cardiology, recheck FLP and CMP  5. Medication monitoring encounter  Z51.81 CBC with Differential/Platelet    COMPLETE METABOLIC PANEL WITH GFR    Lipid panel  6. Osteopenia, unspecified location  V22.30 COMPLETE METABOLIC PANEL WITH GFR    DG Bone Density   recheck dexa, over 4 years ago  7. Need for 23-polyvalent pneumococcal polysaccharide vaccine  Z23 Pneumococcal polysaccharide vaccine 23-valent greater than or equal to 2yo subcutaneous/IM  8. Need for influenza vaccination  Z23 Flu Vaccine QUAD High Dose(Fluad)   Forms from pts employer brought in today requested be completed  and sent into insurance company for health insurance discount.  Will complete when labs result    Kathy Grana, PA-C 05/03/19 6:31 PM  [facility]

## 2019-05-03 NOTE — Assessment & Plan Note (Signed)
Managed by pulmonology.

## 2019-05-03 NOTE — Assessment & Plan Note (Signed)
Pt denies any daily sx or exertional SOB

## 2019-05-04 LAB — CBC WITH DIFFERENTIAL/PLATELET
Absolute Monocytes: 780 cells/uL (ref 200–950)
Basophils Absolute: 66 cells/uL (ref 0–200)
Basophils Relative: 0.8 %
Eosinophils Absolute: 108 cells/uL (ref 15–500)
Eosinophils Relative: 1.3 %
HCT: 45.9 % — ABNORMAL HIGH (ref 35.0–45.0)
Hemoglobin: 15.3 g/dL (ref 11.7–15.5)
Lymphs Abs: 2009 cells/uL (ref 850–3900)
MCH: 32.2 pg (ref 27.0–33.0)
MCHC: 33.3 g/dL (ref 32.0–36.0)
MCV: 96.6 fL (ref 80.0–100.0)
MPV: 10.7 fL (ref 7.5–12.5)
Monocytes Relative: 9.4 %
Neutro Abs: 5337 cells/uL (ref 1500–7800)
Neutrophils Relative %: 64.3 %
Platelets: 285 10*3/uL (ref 140–400)
RBC: 4.75 10*6/uL (ref 3.80–5.10)
RDW: 12.1 % (ref 11.0–15.0)
Total Lymphocyte: 24.2 %
WBC: 8.3 10*3/uL (ref 3.8–10.8)

## 2019-05-04 LAB — COMPLETE METABOLIC PANEL WITH GFR
AG Ratio: 1.6 (calc) (ref 1.0–2.5)
ALT: 26 U/L (ref 6–29)
AST: 17 U/L (ref 10–35)
Albumin: 4.4 g/dL (ref 3.6–5.1)
Alkaline phosphatase (APISO): 89 U/L (ref 37–153)
BUN: 18 mg/dL (ref 7–25)
CO2: 26 mmol/L (ref 20–32)
Calcium: 9.9 mg/dL (ref 8.6–10.4)
Chloride: 106 mmol/L (ref 98–110)
Creat: 0.86 mg/dL (ref 0.50–0.99)
GFR, Est African American: 81 mL/min/{1.73_m2} (ref >=60)
GFR, Est Non African American: 70 mL/min/{1.73_m2} (ref >=60)
Globulin: 2.7 g/dL (calc) (ref 1.9–3.7)
Glucose, Bld: 80 mg/dL (ref 65–99)
Potassium: 4.1 mmol/L (ref 3.5–5.3)
Sodium: 142 mmol/L (ref 135–146)
Total Bilirubin: 0.4 mg/dL (ref 0.2–1.2)
Total Protein: 7.1 g/dL (ref 6.1–8.1)

## 2019-05-04 LAB — LIPID PANEL
Cholesterol: 140 mg/dL (ref <200)
HDL: 52 mg/dL (ref >=50)
LDL Cholesterol (Calc): 72 mg/dL (calc)
Non-HDL Cholesterol (Calc): 88 mg/dL (calc) (ref <130)
Total CHOL/HDL Ratio: 2.7 (calc) (ref <5.0)
Triglycerides: 75 mg/dL (ref <150)

## 2019-05-20 DIAGNOSIS — G4733 Obstructive sleep apnea (adult) (pediatric): Secondary | ICD-10-CM | POA: Diagnosis not present

## 2019-05-30 ENCOUNTER — Telehealth: Payer: Self-pay | Admitting: *Deleted

## 2019-05-30 DIAGNOSIS — Z122 Encounter for screening for malignant neoplasm of respiratory organs: Secondary | ICD-10-CM

## 2019-05-30 DIAGNOSIS — Z87891 Personal history of nicotine dependence: Secondary | ICD-10-CM

## 2019-05-30 NOTE — Telephone Encounter (Signed)
Patient has been notified that annual lung cancer screening low dose CT scan is due currently or will be in near future. Confirmed that patient is within the age range of 55-77, and asymptomatic, (no signs or symptoms of lung cancer). Patient denies illness that would prevent curative treatment for lung cancer if found. Verified smoking history, (current, 35.25 pack year). The shared decision making visit was done 05/03/16. Patient is agreeable for CT scan being scheduled.

## 2019-06-11 ENCOUNTER — Ambulatory Visit
Admission: RE | Admit: 2019-06-11 | Discharge: 2019-06-11 | Disposition: A | Discharge status: Home / Self Care | Payer: BC Managed Care – PPO | Source: Ambulatory Visit | Attending: Family Medicine | Admitting: Family Medicine

## 2019-06-11 ENCOUNTER — Other Ambulatory Visit: Payer: Self-pay

## 2019-06-11 ENCOUNTER — Ambulatory Visit
Admission: RE | Admit: 2019-06-11 | Discharge: 2019-06-11 | Disposition: A | Discharge status: Home / Self Care | Payer: BC Managed Care – PPO | Source: Ambulatory Visit | Attending: Oncology | Admitting: Oncology

## 2019-06-11 DIAGNOSIS — Z1231 Encounter for screening mammogram for malignant neoplasm of breast: Secondary | ICD-10-CM | POA: Diagnosis not present

## 2019-06-11 DIAGNOSIS — M8580 Other specified disorders of bone density and structure, unspecified site: Secondary | ICD-10-CM | POA: Diagnosis not present

## 2019-06-11 DIAGNOSIS — Z122 Encounter for screening for malignant neoplasm of respiratory organs: Secondary | ICD-10-CM

## 2019-06-11 DIAGNOSIS — Z1239 Encounter for other screening for malignant neoplasm of breast: Secondary | ICD-10-CM

## 2019-06-11 DIAGNOSIS — Z87891 Personal history of nicotine dependence: Secondary | ICD-10-CM

## 2019-06-11 DIAGNOSIS — M85851 Other specified disorders of bone density and structure, right thigh: Secondary | ICD-10-CM | POA: Diagnosis not present

## 2019-06-11 DIAGNOSIS — F17210 Nicotine dependence, cigarettes, uncomplicated: Secondary | ICD-10-CM | POA: Diagnosis not present

## 2019-06-13 ENCOUNTER — Encounter: Payer: Self-pay | Admitting: *Deleted

## 2019-07-02 ENCOUNTER — Other Ambulatory Visit: Payer: Self-pay

## 2019-07-02 ENCOUNTER — Ambulatory Visit (INDEPENDENT_AMBULATORY_CARE_PROVIDER_SITE_OTHER): Payer: BC Managed Care – PPO | Admitting: Family Medicine

## 2019-07-02 ENCOUNTER — Encounter: Payer: Self-pay | Admitting: Family Medicine

## 2019-07-02 VITALS — Temp 98.1°F | Ht 67.0 in | Wt 208.0 lb

## 2019-07-02 DIAGNOSIS — M85859 Other specified disorders of bone density and structure, unspecified thigh: Secondary | ICD-10-CM

## 2019-07-02 NOTE — Patient Instructions (Signed)
Osteopenia  Osteopenia is a loss of thickness (density) inside of the bones. Another name for osteopenia is low bone mass. Mild osteopenia is a normal part of aging. It is not a disease, and it does not cause symptoms. However, if you have osteopenia and continue to lose bone mass, you could develop a condition that causes the bones to become thin and break more easily (osteoporosis). You may also lose some height, have back pain, and have a stooped posture. Although osteopenia is not a disease, making changes to your lifestyle and diet can help to prevent osteopenia from developing into osteoporosis. What are the causes? Osteopenia is caused by loss of calcium in the bones.  Bones are constantly changing. Old bone cells are continually being replaced with new bone cells. This process builds new bone. The mineral calcium is needed to build new bone and maintain bone density. Bone density is usually highest around age 24. After that, most people's bodies cannot replace all the bone they have lost with new bone. What increases the risk? You are more likely to develop this condition if:  You are older than age 65.  You are a woman who went through menopause early.  You have a long illness that keeps you in bed.  You do not get enough exercise.  You lack certain nutrients (malnutrition).  You have an overactive thyroid gland (hyperthyroidism).  You smoke.  You drink a lot of alcohol.  You are taking medicines that weaken the bones, such as steroids. What are the signs or symptoms? This condition does not cause any symptoms. You may have a slightly higher risk for bone breaks (fractures), so getting fractures more easily than normal may be an indication of osteopenia. How is this diagnosed? Your health care provider can diagnose this condition with a special type of X-ray exam that measures bone density (dual-energy X-ray absorptiometry, DEXA). This test can measure bone density in your  hips, spine, and wrists. Osteopenia has no symptoms, so this condition is usually diagnosed after a routine bone density screening test is done for osteoporosis. This routine screening is usually done for:  Women who are age 79 or older.  Men who are age 22 or older. If you have risk factors for osteopenia, you may have the screening test at an earlier age. How is this treated? Making dietary and lifestyle changes can lower your risk for osteoporosis. If you have severe osteopenia that is close to becoming osteoporosis, your health care provider may prescribe medicines and dietary supplements such as calcium and vitamin D. These supplements help to rebuild bone density.   Things you can do now!   Take over-the-counter and prescription medicines only as told by your health care provider. These include vitamins and supplements.  Eat a diet that is high in calcium and vitamin D. ? Calcium is found in dairy products, beans, salmon, and leafy green vegetables like spinach and broccoli. ? Look for foods that have vitamin D and calcium added to them (fortified foods), such as orange juice, cereal, and bread.  Do 30 or more minutes of a weight-bearing exercise every day, such as walking, jogging, or playing a sport. These types of exercises strengthen the bones.  Take precautions at home to lower your risk of falling, such as: ? Keeping rooms well-lit and free of clutter, such as cords. ? Installing safety rails on stairs. ? Using rubber mats in the bathroom or other areas that are often wet or slippery.  Do  not use any products that contain nicotine or tobacco, such as cigarettes and e-cigarettes. If you need help quitting, ask your health care provider.  Avoid alcohol or limit alcohol intake to no more than 1 drink a day for nonpregnant women and 2 drinks a day for men. One drink equals 12 oz of beer, 5 oz of wine, or 1 oz of hard liquor.  Keep all follow-up visits as told by your health  care provider. This is important.    Summary  Osteopenia is a loss of thickness (density) inside of the bones. Another name for osteopenia is low bone mass.  Osteopenia is not a disease, but it may increase your risk for a condition that causes the bones to become thin and break more easily (osteoporosis).  You may be at risk for osteopenia if you are older than age 54 or if you are a woman who went through early menopause.  Osteopenia does not cause any symptoms, but it can be diagnosed with a bone density screening test.  Dietary and lifestyle changes are the first treatment for osteopenia. These may lower your risk for osteoporosis. This information is not intended to replace advice given to you by your health care provider. Make sure you discuss any questions you have with your health care provider. Document Released: 05/17/2017 Document Revised: 07/21/2017 Document Reviewed: 05/17/2017 Elsevier Patient Education  2020 [place] for osteoporosis/osteopenia are call bisphosphonates - Fosamax is a common one.  Alendronate tablets What is this medicine? ALENDRONATE (a LEN droe nate) slows calcium loss from bones. It helps to make normal healthy bone and to slow bone loss in people with Paget's disease and osteoporosis. It may be used in others at risk for bone loss. This medicine may be used for other purposes; ask your health care provider or pharmacist if you have questions. COMMON BRAND NAME(S): Fosamax What should I tell my health care provider before I take this medicine? They need to know if you have any of these conditions:  dental disease  esophagus, stomach, or intestine problems, like acid reflux or GERD  kidney disease  low blood calcium  low vitamin D  problems sitting or standing 30 minutes  trouble swallowing  an unusual or allergic reaction to alendronate, other medicines, foods, dyes, or preservatives  pregnant or trying to get  pregnant  breast-feeding How should I use this medicine? You must take this medicine exactly as directed or you will lower the amount of the medicine you absorb into your body or you may cause yourself harm. Take this medicine by mouth first thing in the morning, after you are up for the day. Do not eat or drink anything before you take your medicine. Swallow the tablet with a full glass (6 to 8 fluid ounces) of plain water. Do not take this medicine with any other drink. Do not chew or crush the tablet. After taking this medicine, do not eat breakfast, drink, or take any medicines or vitamins for at least 30 minutes. Sit or stand up for at least 30 minutes after you take this medicine; do not lie down. Do not take your medicine more often than directed. Talk to your pediatrician regarding the use of this medicine in children. Special care may be needed. Overdosage: If you think you have taken too much of this medicine contact a poison control center or emergency room at once. NOTE: This medicine is only for you. Do not share this medicine with  others. What if I miss a dose? If you miss a dose, do not take it later in the day. Continue your normal schedule starting the next morning. Do not take double or extra doses. What may interact with this medicine?  aluminum hydroxide  antacids  aspirin  calcium supplements  drugs for inflammation like ibuprofen, naproxen, and others  iron supplements  magnesium supplements  vitamins with minerals This list may not describe all possible interactions. Give your health care provider a list of all the medicines, herbs, non-prescription drugs, or dietary supplements you use. Also tell them if you smoke, drink alcohol, or use illegal drugs. Some items may interact with your medicine. What should I watch for while using this medicine? Visit your doctor or health care professional for regular checks ups. It may be some time before you see benefit from  this medicine. Do not stop taking your medicine except on your doctor's advice. Your doctor or health care professional may order blood tests and other tests to see how you are doing. You should make sure you get enough calcium and vitamin D while you are taking this medicine, unless your doctor tells you not to. Discuss the foods you eat and the vitamins you take with your health care professional. Some people who take this medicine have severe bone, joint, and/or muscle pain. This medicine may also increase your risk for a broken thigh bone. Tell your doctor right away if you have pain in your upper leg or groin. Tell your doctor if you have any pain that does not go away or that gets worse. This medicine can make you more sensitive to the sun. If you get a rash while taking this medicine, sunlight may cause the rash to get worse. Keep out of the sun. If you cannot avoid being in the sun, wear protective clothing and use sunscreen. Do not use sun lamps or tanning beds/booths. What side effects may I notice from receiving this medicine? Side effects that you should report to your doctor or health care professional as soon as possible:  allergic reactions like skin rash, itching or hives, swelling of the face, lips, or tongue  black or tarry stools  bone, muscle or joint pain  changes in vision  chest pain  heartburn or stomach pain  jaw pain, especially after dental work  pain or trouble when swallowing  redness, blistering, peeling or loosening of the skin, including inside the mouth Side effects that usually do not require medical attention (report to your doctor or health care professional if they continue or are bothersome):  changes in taste  diarrhea or constipation  eye pain or itching  headache  nausea or vomiting  stomach gas or fullness This list may not describe all possible side effects. Call your doctor for medical advice about side effects. You may report side  effects to FDA at 1-800-FDA-1088. Where should I keep my medicine? Keep out of the reach of children. Store at room temperature of 15 and 30 degrees C (59 and 86 degrees F). Throw away any unused medicine after the expiration date. NOTE: This sheet is a summary. It may not cover all possible information. If you have questions about this medicine, talk to your doctor, pharmacist, or health care provider.  2020 Elsevier/Gold Standard (2011-02-04 08:56:09)

## 2019-07-02 NOTE — Progress Notes (Signed)
Name: Kathy Gregory   MRN: [id number]    DOB: 22-Oct-1950   Date:07/02/2019       Progress Note  Subjective:    Chief Complaint  Chief Complaint  Patient presents with  . Follow-up    abnormal bone density    I connected with  [name]  on 07/02/19 at  2:20 PM EST by a video enabled telemedicine application and verified that I am speaking with the correct person using two identifiers.  I discussed the limitations of evaluation and management by telemedicine and the availability of in person appointments. The patient expressed understanding and agreed to proceed. Staff also discussed with the patient that there may be a patient responsible charge related to this service. Patient Location: home Provider Location: [facility] clinic Additional Individuals present: none   Osteopenia - new dx Pt appt today to review bone density scan done in Sept which showed osteopenia. T-score to Right femur was -2.0 Left forearm radius was normal Probability of osteoporotic fractures was 11.3% within the next 10 years and probability of hip fracture is 2.9% in the next 10 years Patient has reviewed her results on MyChart and we reviewed the little more today because she said she did not understand them.  Patient states that she does eat dairy in diet, she is taking calcium 600 mg and vit d supplements 800 IU BID for a total of 1200 mg calcium and 1600 international units of vitamin D and per my recommendations with her results a few months ago she was told to take 2000 international units of vitamin D so she is added an additional 400 Her last complete metabolic panel showed normal level of calcium, she has no hx of Vit D deficiency.  She is postmenopausal and is a current smoker.  She denies any history of bowel disease, malabsorption.   We discussed fall risk in her home she denies any loose cords, rugs, denies any small pets, she has good lighting.   She is doing exercising several times a week mostly  walking on a treadmill.  Not had any falls in the last year   Recent CT lung cancer screening also reviewed.  Patient Active Problem List   Diagnosis Date Noted  . Sleep apnea 10/26/2018  . Obesity (BMI 30.0-34.9) 01/18/2017  . Anxiety attack 01/18/2017  . Tobacco abuse 01/18/2017  . Coronary artery disease 05/23/2016  . Aortic atherosclerosis ([place]) 05/23/2016  . Centrilobular emphysema ([place]) 05/23/2016  . Osteopenia 04/18/2016  . Hx of colonic polyps   . Benign neoplasm of sigmoid colon   . Personal history of tobacco use, presenting hazards to health 04/13/2015  . Hyperlipidemia LDL goal <70 08/07/2014    Social History   Tobacco Use  . Smoking status: Current Every Day Smoker    Packs/day: 0.50    Years: 47.00    Pack years: 23.50    Types: Cigarettes  . Smokeless tobacco: Never Used  Substance Use Topics  . Alcohol use: Yes    Alcohol/week: 1.0 standard drinks    Types: 1 Glasses of wine per week    Comment: occ     Current Outpatient Medications:  .  aspirin EC 81 MG tablet, Take 1 tablet (81 mg total) by mouth daily., Disp: 90 tablet, Rfl: 3 .  atorvastatin (LIPITOR) 20 MG tablet, TAKE 1 TABLET BY MOUTH EVERY DAY, Disp: 90 tablet, Rfl: 2 .  clonazePAM (KLONOPIN) 0.5 MG tablet, Take 0.5-1 tablets (0.25-0.5 mg total) by mouth  2 (two) times daily as needed for anxiety. Never take within six hours of alcohol, Disp: 15 tablet, Rfl: 0 .  Fish Oil-Cholecalciferol (FISH OIL + D3) 1000-1000 MG-UNIT CAPS, Take by mouth daily. , Disp: , Rfl:  .  Multiple Minerals-Vitamins (CALCIUM & VIT D3 BONE HEALTH PO), Take 2 tablets by mouth daily. Calcium 600mg - Vitamin D 400mg , Disp: , Rfl:  .  psyllium (REGULOID) 0.52 G capsule, Take 0.52 g by mouth daily., Disp: , Rfl:  .  traZODone (DESYREL) 50 MG tablet, Take 1 tablet (50 mg total) by mouth at bedtime as needed for sleep., Disp: 90 tablet, Rfl: 3  No Known Allergies  I personally reviewed active problem list, medication list,  allergies, family history, social history, health maintenance, notes from last encounter, lab results, imaging with the patient/caregiver today.  Review of Systems  Constitutional: Negative.   HENT: Negative.   Eyes: Negative.   Respiratory: Negative.   Cardiovascular: Negative.   Gastrointestinal: Negative.   Endocrine: Negative.   Genitourinary: Negative.   Musculoskeletal: Negative.   Skin: Negative.   Allergic/Immunologic: Negative.   Neurological: Negative.   Hematological: Negative.   Psychiatric/Behavioral: Negative.   All other systems reviewed and are negative.    Objective:   Virtual encounter, vitals limited, only able to obtain the following Today's Vitals   07/02/19 1418  Temp: 98.1 F (36.7 C)  Weight: 208 lb (94.3 kg)  Height: 5\' 7"  (1.702 m)   Body mass index is 32.58 kg/m. Nursing Note and Vital Signs reviewed.  Physical Exam Vitals signs and nursing note reviewed.  Constitutional:      General: She is not in acute distress.    Appearance: Normal appearance. She is well-developed. She is not ill-appearing, toxic-appearing or diaphoretic.  HENT:     Head: Normocephalic and atraumatic.     Nose: Nose normal.  Eyes:     General:        Right eye: No discharge.        Left eye: No discharge.     Conjunctiva/sclera: Conjunctivae normal.  Neck:     Trachea: No tracheal deviation.  Pulmonary:     Effort: Pulmonary effort is normal. No respiratory distress.     Breath sounds: No stridor.  Skin:    Coloration: Skin is not jaundiced or pale.     Findings: No erythema or rash.  Neurological:     Mental Status: She is alert.     Motor: No abnormal muscle tone.  Psychiatric:        Mood and Affect: Mood normal.        Behavior: Behavior normal.     PE limited by telephone encounter  No results found for this or any previous visit (from the past 72 hour(s)).  Assessment and Plan:     ICD-10-CM   1. Osteopenia of neck of femur, unspecified  laterality  M85.859   We reviewed patient's bone scan results and also risk of fracture.  She is doing appropriate supplements.  We talked about weight bearing exercises and other things she can do to support her bone strength and health.  We also discussed starting medications now to strengthen bones like Fosamax or waiting a few years and rechecking a bone scan and she preferred to wait for now, extra information and handout will be given to the patient through her after visit summary through my chart.  We will repeat a bone scan in the next 2 years.    -  I discussed the assessment and treatment plan with the patient. The patient was provided an opportunity to ask questions and all were answered. The patient agreed with the plan and demonstrated an understanding of the instructions.  I provided 15  minutes of non-face-to-face time during this encounter.  Delsa Grana, PA-C 07/02/19 2:49 PM

## 2019-08-21 DIAGNOSIS — G4733 Obstructive sleep apnea (adult) (pediatric): Secondary | ICD-10-CM | POA: Diagnosis not present

## 2019-10-31 ENCOUNTER — Other Ambulatory Visit: Payer: Self-pay

## 2019-10-31 ENCOUNTER — Encounter: Payer: Self-pay | Admitting: Family Medicine

## 2019-10-31 ENCOUNTER — Ambulatory Visit (INDEPENDENT_AMBULATORY_CARE_PROVIDER_SITE_OTHER): Payer: BC Managed Care – PPO | Admitting: Family Medicine

## 2019-10-31 VITALS — BP 126/86 | HR 88 | Temp 98.6°F | Resp 14 | Ht 67.0 in | Wt 218.8 lb

## 2019-10-31 DIAGNOSIS — M85859 Other specified disorders of bone density and structure, unspecified thigh: Secondary | ICD-10-CM

## 2019-10-31 DIAGNOSIS — I700 Atherosclerosis of aorta: Secondary | ICD-10-CM | POA: Diagnosis not present

## 2019-10-31 DIAGNOSIS — F419 Anxiety disorder, unspecified: Secondary | ICD-10-CM

## 2019-10-31 DIAGNOSIS — R718 Other abnormality of red blood cells: Secondary | ICD-10-CM

## 2019-10-31 DIAGNOSIS — E559 Vitamin D deficiency, unspecified: Secondary | ICD-10-CM | POA: Diagnosis not present

## 2019-10-31 DIAGNOSIS — Z720 Tobacco use: Secondary | ICD-10-CM

## 2019-10-31 DIAGNOSIS — I2510 Atherosclerotic heart disease of native coronary artery without angina pectoris: Secondary | ICD-10-CM | POA: Diagnosis not present

## 2019-10-31 DIAGNOSIS — J432 Centrilobular emphysema: Secondary | ICD-10-CM

## 2019-10-31 DIAGNOSIS — Z5181 Encounter for therapeutic drug level monitoring: Secondary | ICD-10-CM

## 2019-10-31 DIAGNOSIS — E785 Hyperlipidemia, unspecified: Secondary | ICD-10-CM

## 2019-10-31 DIAGNOSIS — M8580 Other specified disorders of bone density and structure, unspecified site: Secondary | ICD-10-CM | POA: Diagnosis not present

## 2019-10-31 DIAGNOSIS — G4700 Insomnia, unspecified: Secondary | ICD-10-CM

## 2019-10-31 LAB — CBC WITH DIFFERENTIAL/PLATELET
Absolute Monocytes: 623 cells/uL (ref 200–950)
Basophils Absolute: 61 cells/uL (ref 0–200)
Basophils Relative: 0.8 %
Eosinophils Absolute: 167 cells/uL (ref 15–500)
Eosinophils Relative: 2.2 %
HCT: 46 % — ABNORMAL HIGH (ref 35.0–45.0)
Hemoglobin: 15.1 g/dL (ref 11.7–15.5)
Lymphs Abs: 1634 cells/uL (ref 850–3900)
MCH: 32.1 pg (ref 27.0–33.0)
MCHC: 32.8 g/dL (ref 32.0–36.0)
MCV: 97.7 fL (ref 80.0–100.0)
MPV: 10.9 fL (ref 7.5–12.5)
Monocytes Relative: 8.2 %
Neutro Abs: 5115 cells/uL (ref 1500–7800)
Neutrophils Relative %: 67.3 %
Platelets: 276 10*3/uL (ref 140–400)
RBC: 4.71 10*6/uL (ref 3.80–5.10)
RDW: 12.3 % (ref 11.0–15.0)
Total Lymphocyte: 21.5 %
WBC: 7.6 10*3/uL (ref 3.8–10.8)

## 2019-10-31 LAB — LIPID PANEL
Cholesterol: 134 mg/dL (ref <200)
HDL: 52 mg/dL (ref >=50)
LDL Cholesterol (Calc): 65 mg/dL (calc)
Non-HDL Cholesterol (Calc): 82 mg/dL (calc) (ref <130)
Total CHOL/HDL Ratio: 2.6 (calc) (ref <5.0)
Triglycerides: 83 mg/dL (ref <150)

## 2019-10-31 LAB — COMPLETE METABOLIC PANEL WITH GFR
AG Ratio: 1.5 (calc) (ref 1.0–2.5)
ALT: 23 U/L (ref 6–29)
AST: 17 U/L (ref 10–35)
Albumin: 4 g/dL (ref 3.6–5.1)
Alkaline phosphatase (APISO): 96 U/L (ref 37–153)
BUN: 15 mg/dL (ref 7–25)
CO2: 30 mmol/L (ref 20–32)
Calcium: 9.7 mg/dL (ref 8.6–10.4)
Chloride: 105 mmol/L (ref 98–110)
Creat: 0.79 mg/dL (ref 0.50–0.99)
GFR, Est African American: 90 mL/min/{1.73_m2} (ref >=60)
GFR, Est Non African American: 77 mL/min/{1.73_m2} (ref >=60)
Globulin: 2.7 g/dL (calc) (ref 1.9–3.7)
Glucose, Bld: 79 mg/dL (ref 65–99)
Potassium: 4.2 mmol/L (ref 3.5–5.3)
Sodium: 142 mmol/L (ref 135–146)
Total Bilirubin: 0.5 mg/dL (ref 0.2–1.2)
Total Protein: 6.7 g/dL (ref 6.1–8.1)

## 2019-10-31 LAB — VITAMIN D 25 HYDROXY (VIT D DEFICIENCY, FRACTURES): Vit D, 25-Hydroxy: 35 ng/mL (ref 30–100)

## 2019-10-31 MED ORDER — TRAZODONE HCL 50 MG PO TABS: 50.0000 mg | ORAL_TABLET | Freq: Every evening | ORAL | 3 refills | Status: DC | PRN

## 2019-10-31 NOTE — Progress Notes (Signed)
Name: Kathy Gregory   MRN: [id number]    DOB: [birth year]   Date:10/31/2019       Progress Note  Chief Complaint  Patient presents with  . Follow-up  . Hyperlipidemia  . Insomnia  . Anxiety     Subjective:   Kathy Gregory is a 69 y.o. female, presents to clinic for routine follow up on the conditions listed above.  Hyperlipidemia:  Current Medication Regimen:  lipitor 20 mg  Last Lipids: Lab Results  Component Value Date   CHOL 140 05/03/2019   HDL 52 05/03/2019   LDLCALC 72 05/03/2019   TRIG 75 05/03/2019   CHOLHDL 2.7 05/03/2019  - Current Diet:  Trying to work on a healthy diet, chicken fish and vegetables  - Denies: Chest pain, shortness of breath, myalgias. - Documented aortic atherosclerosis? Yes - Risk factors for atherosclerosis: hypercholesterolemia and smoking  Osteoporosis we previously reviewed her bone density scan and she was due for labs, since last visit she has increased Vit D and calcium 600-400 BID and added additional vit D - she believes taking 1200 mg Ca 2+ and 1200 IU vit D 3, trying to exercise as able with her back pain/scoliosis.  Reviewed her chart she has not had any hypercalcemia or vitamin D deficiency or vitamin D labs done in the past.  Her last bone scan was over 4 years ago Reviewed again her bone density scan results, risk factors and we discussed at length postmenopausal changes Indications for dexa:   COPD, Height Loss, Osteopenia, Postmenopausal, Scoliosis, Tobacco User, Tobacco User (Current Smoker) Fractures: Treatments: calcium w/ vit D, Multi-Vitamin  Patient states that her exercise is limited by her scoliosis and degenerative changes in her spine.  She did see Dr. Ancil Boozer for it in the past and she was referred to a chiropractor who she has been with for several months she has had some improvement, she did more intensive treatment multiple times a week initially and now goes about once a month, she is never done physical therapy  before.  She will try and do walking other exercise modalities and she will try and do it as much as she can until she has a flareup of her back pain.  Insomnia still well controlled with trazodone 50 mg at bedtime, refill today  Random panic attacks, she was prescribed klonopin 0.5 mg taking 0.25 to 0.5 only sparingly, sometimes only one a month, sometimes a little more, but given 10 pills 6 months ago and she still has some, anxiety has not gotten worse or more frequent.  She has gotten her first Covid vaccination getting her next one her job is still been working with her over allowing remote work from home she has been very careful with double masking she has not been around her family or hugged her grandkids in over a year we discussed the efficacy of vaccine ability with new CDC guidelines and with this is signs and data available from the vaccine efficacy of being able to start to go back to some normalcy still being careful around those that may be high risk immunocompromise or not had a vaccine but otherwise CDC has stated he can start to go around family members low risk family members that are not vaccinated.  Patient does feel that this has somewhat alleviated some of her anxiety.  Patient continues to smoke roughly half pack per day has been difficult for her to quit she does believe that some days she is  has had slightly less than half a pack per day.  She has never tried any medications patches to help her quit completely.  Right now she does not believe that she would be very successful quitting smoking we did discuss at length today Chantix medication how to use it, other smoking cessation techniques, offered local resources including [facility] stop smoking program.  She states that she did smoking cessation program with her work before and that did help her reduce number of cigarettes in the past.  She denies any change to her respiratory symptoms/emphysema she is not having any more shortness of  breath with exertion, no recent exacerbations, no cough wheeze hemoptysis unintentional weight loss, night sweats.  Her last CT chest lung cancer screening was done in October and is due for 67-month follow-up evidence of small scattered pulmonary nodules very small in size  Patient Active Problem List   Diagnosis Date Noted  . Sleep apnea 10/26/2018  . Obesity (BMI 30.0-34.9) 01/18/2017  . Anxiety attack 01/18/2017  . Tobacco abuse 01/18/2017  . Coronary artery disease 05/23/2016  . Aortic atherosclerosis ([place]) 05/23/2016  . Centrilobular emphysema ([place]) 05/23/2016  . Osteopenia 04/18/2016  . Hx of colonic polyps   . Benign neoplasm of sigmoid colon   . Personal history of tobacco use, presenting hazards to health 04/13/2015  . Hyperlipidemia LDL goal <70 08/07/2014    Past Surgical History:  Procedure Laterality Date  . BREAST CYST ASPIRATION Left   . COLONOSCOPY  2006  . COLONOSCOPY WITH PROPOFOL N/A 05/15/2015   Procedure: COLONOSCOPY WITH PROPOFOL;  Surgeon: Lucilla Lame, MD;  Location: [place];  Service: Endoscopy;  Laterality: N/A;  . POLYPECTOMY  05/15/2015   Procedure: POLYPECTOMY;  Surgeon: Lucilla Lame, MD;  Location: [place];  Service: Endoscopy;;    Family History  Problem Relation Age of Onset  . Hypertension Mother   . Diabetes Mother   . Heart disease Father 5       CABG  . Stroke Father 16       Occurred during CABG  . Breast cancer Paternal Grandmother   . Diabetes Brother   . Hypertension Brother   . Kidney disease Maternal Grandmother   . Cirrhosis Brother     Social History   Tobacco Use  . Smoking status: Current Every Day Smoker    Packs/day: 0.50    Years: 47.00    Pack years: 23.50    Types: Cigarettes  . Smokeless tobacco: Never Used  Substance Use Topics  . Alcohol use: Yes    Alcohol/week: 1.0 standard drinks    Types: 1 Glasses of wine per week    Comment: occ  . Drug use: No      Current Outpatient  Medications:  .  aspirin EC 81 MG tablet, Take 1 tablet (81 mg total) by mouth daily., Disp: 90 tablet, Rfl: 3 .  atorvastatin (LIPITOR) 20 MG tablet, TAKE 1 TABLET BY MOUTH EVERY DAY, Disp: 90 tablet, Rfl: 2 .  clonazePAM (KLONOPIN) 0.5 MG tablet, Take 0.5-1 tablets (0.25-0.5 mg total) by mouth 2 (two) times daily as needed for anxiety. Never take within six hours of alcohol, Disp: 15 tablet, Rfl: 0 .  Fish Oil-Cholecalciferol (FISH OIL + D3) 1000-1000 MG-UNIT CAPS, Take by mouth daily. , Disp: , Rfl:  .  Multiple Minerals-Vitamins (CALCIUM & VIT D3 BONE HEALTH PO), Take 2 tablets by mouth daily. Calcium 600mg - Vitamin D 400mg , Disp: , Rfl:  .  psyllium (REGULOID)  0.52 G capsule, Take 0.52 g by mouth daily., Disp: , Rfl:  .  traZODone (DESYREL) 50 MG tablet, Take 1 tablet (50 mg total) by mouth at bedtime as needed for sleep., Disp: 90 tablet, Rfl: 3  No Known Allergies  Chart Review Today: I personally reviewed active problem list, medication list, allergies, family history, social history, health maintenance, notes from last encounter, lab results, imaging with the patient/caregiver today.   Review of Systems  10 Systems reviewed and are negative for acute change except as noted in the HPI.    Objective:    Vitals:   10/31/19 0802  BP: 126/86  Pulse: 88  Resp: 14  Temp: 98.6 F (37 C)  SpO2: 98%  Weight: 218 lb 12.8 oz (99.2 kg)  Height: 5\' 7"  (1.702 m)    Body mass index is 34.27 kg/m.  Physical Exam Vitals and nursing note reviewed.  Constitutional:      General: She is not in acute distress.    Appearance: Normal appearance. She is well-developed. She is obese. She is not ill-appearing, toxic-appearing or diaphoretic.     Interventions: Face mask in place.  HENT:     Head: Normocephalic and atraumatic.     Right Ear: External ear normal.     Left Ear: External ear normal.  Eyes:     General: Lids are normal. No scleral icterus.       Right eye: No discharge.         Left eye: No discharge.     Conjunctiva/sclera: Conjunctivae normal.  Neck:     Trachea: Phonation normal. No tracheal deviation.  Cardiovascular:     Rate and Rhythm: Normal rate and regular rhythm.     Pulses: Normal pulses.          Radial pulses are 2+ on the right side and 2+ on the left side.       Posterior tibial pulses are 2+ on the right side and 2+ on the left side.     Heart sounds: Normal heart sounds. No murmur. No friction rub. No gallop.   Pulmonary:     Effort: Pulmonary effort is normal. No respiratory distress.     Breath sounds: Normal breath sounds. No stridor. No wheezing, rhonchi or rales.  Chest:     Chest wall: No tenderness.  Abdominal:     General: Bowel sounds are normal. There is no distension.     Palpations: Abdomen is soft.     Tenderness: There is no abdominal tenderness. There is no guarding or rebound.  Musculoskeletal:        General: No deformity.     Cervical back: Normal range of motion and neck supple.     Right lower leg: No edema.     Left lower leg: No edema.     Comments: Some difficulty with positional changes from being seated in the chair in the exam room to standing and attempting to get up on the exam table some decreased range of motion noted to her back did not have her try to get up on the exam table due to this.  Mild noted curvature to her mid to lower thoracic back  Lymphadenopathy:     Cervical: No cervical adenopathy.  Skin:    General: Skin is warm and dry.     Capillary Refill: Capillary refill takes less than 2 seconds.     Coloration: Skin is not jaundiced or pale.     Findings: No  rash.  Neurological:     Mental Status: She is alert and oriented to person, place, and time.     Motor: No abnormal muscle tone.     Gait: Gait abnormal.     Comments: Antalgic gait  Psychiatric:        Mood and Affect: Mood normal.        Speech: Speech normal.        Behavior: Behavior normal.       PHQ2/9: Depression screen  [facility] 2/9 10/31/2019 07/02/2019 05/03/2019 10/26/2018 10/25/2017  Decreased Interest 0 0 0 0 0  Down, Depressed, Hopeless 0 0 0 0 0  PHQ - 2 Score 0 0 0 0 0  Altered sleeping 0 0 0 0 -  Tired, decreased energy 0 0 0 0 -  Change in appetite 0 0 0 0 -  Feeling bad or failure about yourself  0 0 0 0 -  Trouble concentrating 0 0 0 0 -  Moving slowly or fidgety/restless 0 0 0 0 -  Suicidal thoughts 0 0 0 0 -  PHQ-9 Score 0 0 0 0 -  Difficult doing work/chores Not difficult at all Not difficult at all Not difficult at all Not difficult at all -    phq 9 is neg- reviewed today  Fall Risk: Fall Risk  10/31/2019 07/02/2019 05/03/2019 10/26/2018 10/25/2017  Falls in the past year? 0 0 0 0 No  Number falls in past yr: 0 0 0 0 -  Injury with Fall? 0 0 0 0 -    Functional Status Survey: Is the patient deaf or have difficulty hearing?: No Does the patient have difficulty seeing, even when wearing glasses/contacts?: No Does the patient have difficulty concentrating, remembering, or making decisions?: No Does the patient have difficulty walking or climbing stairs?: No Does the patient have difficulty dressing or bathing?: No Does the patient have difficulty doing errands alone such as visiting a doctor's office or shopping?: No   Assessment & Plan:     ICD-10-CM   1. Osteopenia of neck of femur, unspecified laterality  123XX123 COMPLETE METABOLIC PANEL WITH GFR    VITAMIN D 25 Hydroxy (Vit-D Deficiency, Fractures)  2. Aortic atherosclerosis (HCC) Chronic 123XX123 COMPLETE METABOLIC PANEL WITH GFR    Lipid panel   Compliant with statin, monitoring, continue to manage blood pressure and cholesterol  3. Hyperlipidemia LDL goal <70  [id number] COMPLETE METABOLIC PANEL WITH GFR    Lipid panel   Compliant with statin, recheck labs today, no myalgias or jaundice, no claudication  4. Coronary artery disease involving native coronary artery of native heart without angina pectoris  0000000 COMPLETE METABOLIC PANEL WITH GFR     Lipid panel   No symptoms concerning for ACS, she is managing all of her conditions and compliant with medications, working on heart healthy diet and lifestyle  5. Elevated hematocrit  R71.8 CBC with Differential/Platelet   We discussed her mildly elevated hematocrit with normal hemoglobin likely this is due to smoking will recheck with labs today  6. Tobacco abuse  [id number] COMPLETE METABOLIC PANEL WITH GFR    CBC with Differential/Platelet    Lipid panel   Lengthy discussion about smoking cessation offered resources medications patient will let us know when she feels ready to try  7. Anxiety disorder, unspecified type  F41.9    intermittent sx, rare only maybe 1-2 x a month, improving, she is using Klonopin very rarely and appropriately  8. Insomnia, unspecified type  G47.00  traZODone (DESYREL) 50 MG tablet   Stable with trazodone 50 mg at bedtime  9. Centrilobular emphysema (HCC) Chronic [id number] COMPLETE METABOLIC PANEL WITH GFR   Discussed smoking cessation, patient has had no change to any respiratory symptoms, dyspnea on exertion no recent bronchitis or  wheeze  10. Encounter for medication monitoring  [id number] COMPLETE METABOLIC PANEL WITH GFR    CBC with Differential/Platelet    Lipid panel    Return for 6 month follow routine.   Delsa Grana, PA-C 10/31/19 8:11 AM

## 2019-11-20 DIAGNOSIS — G4733 Obstructive sleep apnea (adult) (pediatric): Secondary | ICD-10-CM | POA: Diagnosis not present

## 2019-12-19 DIAGNOSIS — Z01818 Encounter for other preprocedural examination: Secondary | ICD-10-CM | POA: Diagnosis not present

## 2019-12-19 DIAGNOSIS — H4042X1 Glaucoma secondary to eye inflammation, left eye, mild stage: Secondary | ICD-10-CM | POA: Diagnosis not present

## 2019-12-19 DIAGNOSIS — H2512 Age-related nuclear cataract, left eye: Secondary | ICD-10-CM | POA: Diagnosis not present

## 2019-12-30 DIAGNOSIS — H2512 Age-related nuclear cataract, left eye: Secondary | ICD-10-CM | POA: Diagnosis not present

## 2019-12-30 DIAGNOSIS — H401121 Primary open-angle glaucoma, left eye, mild stage: Secondary | ICD-10-CM | POA: Diagnosis not present

## 2019-12-30 DIAGNOSIS — H409 Unspecified glaucoma: Secondary | ICD-10-CM | POA: Diagnosis not present

## 2020-01-10 DIAGNOSIS — H40051 Ocular hypertension, right eye: Secondary | ICD-10-CM | POA: Diagnosis not present

## 2020-01-10 DIAGNOSIS — H2511 Age-related nuclear cataract, right eye: Secondary | ICD-10-CM | POA: Diagnosis not present

## 2020-01-10 DIAGNOSIS — H409 Unspecified glaucoma: Secondary | ICD-10-CM | POA: Diagnosis not present

## 2020-01-11 IMAGING — CT CT CHEST LUNG CANCER SCREENING LOW DOSE W/O CM
2 of 5 series · 15 of 40 positions shown, 18 images · non-contrast
Comparison: 10/25/2017 chest radiograph. 05/31/2017 screening chest
CT.

CLINICAL DATA: 67-year-old asymptomatic female current smoker with
34.5 pack-year smoking history.

EXAM:
CT CHEST WITHOUT CONTRAST LOW-DOSE FOR LUNG CANCER SCREENING
TECHNIQUE: Multidetector CT imaging of the chest was performed following the
standard protocol without IV contrast.

[Series 3: lung · axial · 0.62mm/px · z∈[-1233,-945]mm · 12 of 320 slices shown, 15 images (1 of 2)]
[im 16/320  mediastinal]
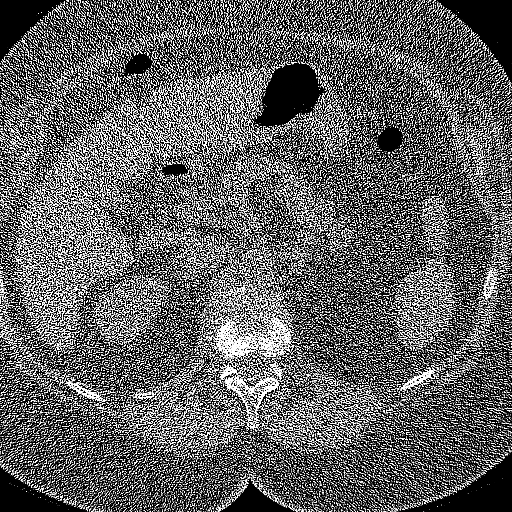
[im 16/320  lung]
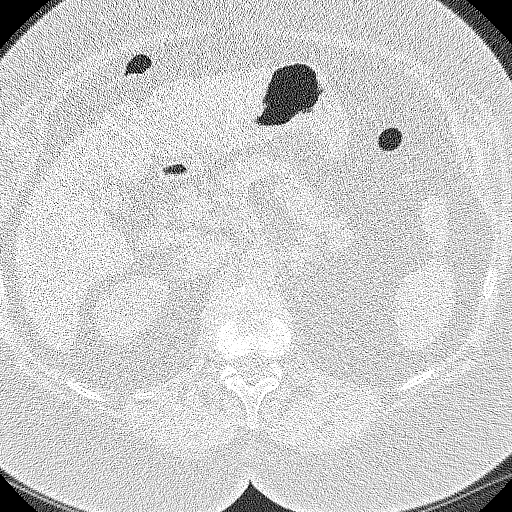
[im 48/320  lung]
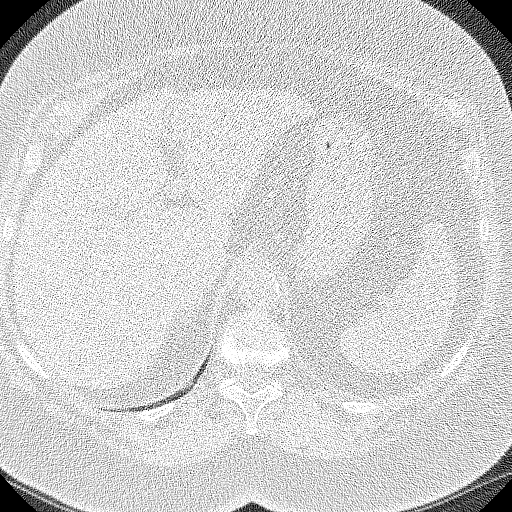
[im 64/320  lung]
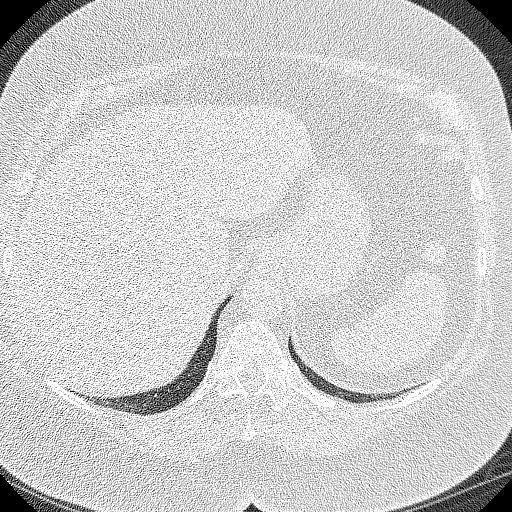
[im 96/320  lung]
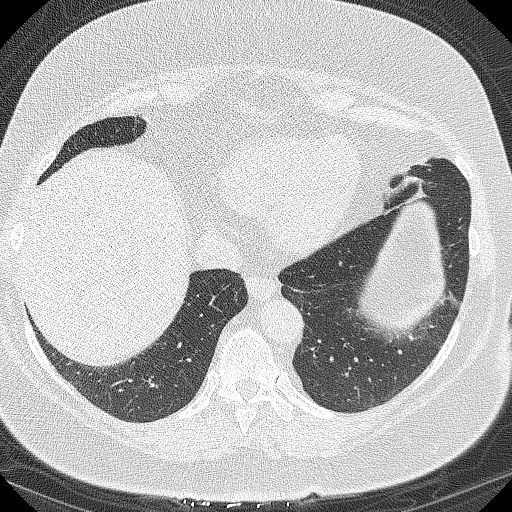
[im 128/320  mediastinal]
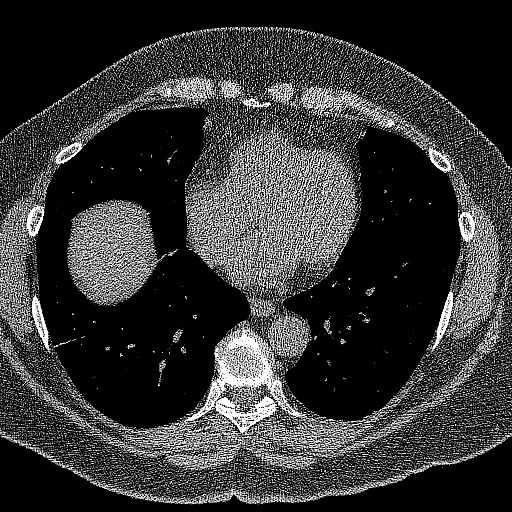
[im 128/320  lung]
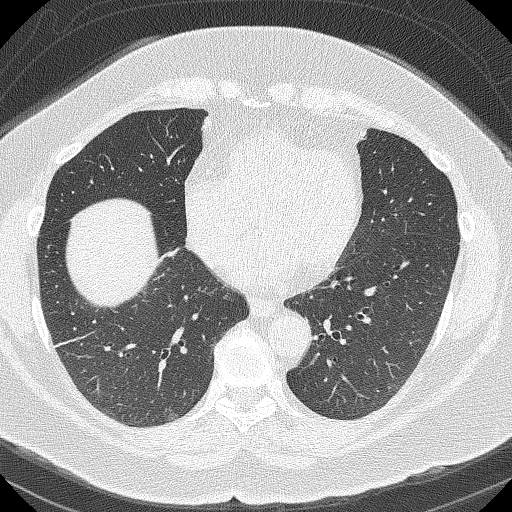
[im 144/320  lung]
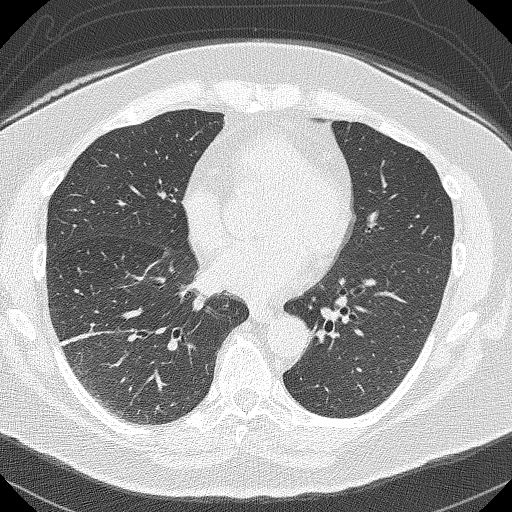
[im 176/320  lung]
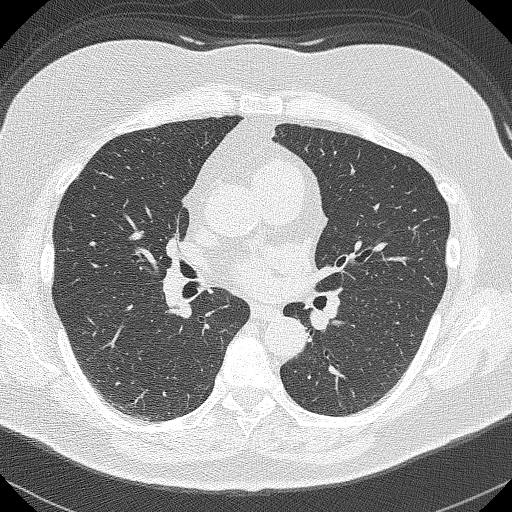
[im 192/320  lung]
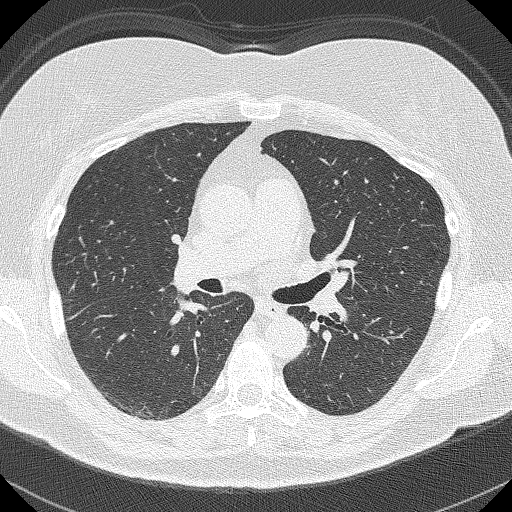
[im 224/320  mediastinal]
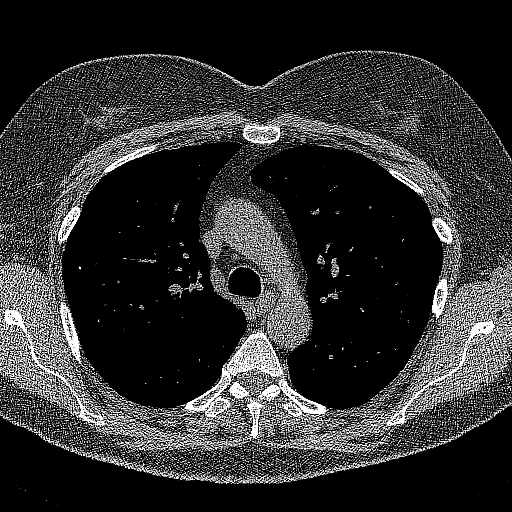
[im 224/320  lung]
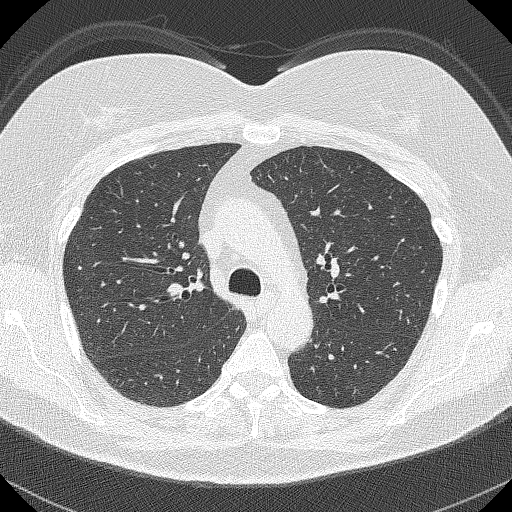
[im 256/320  lung]
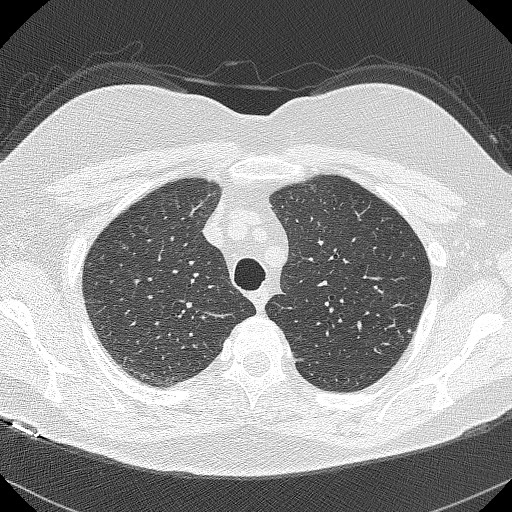
[im 272/320  lung]
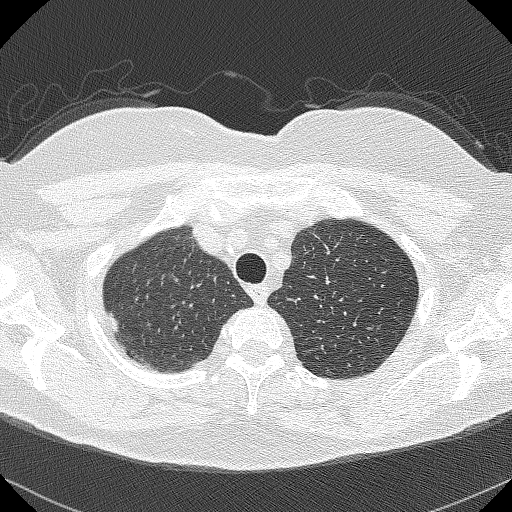
[im 304/320  lung]
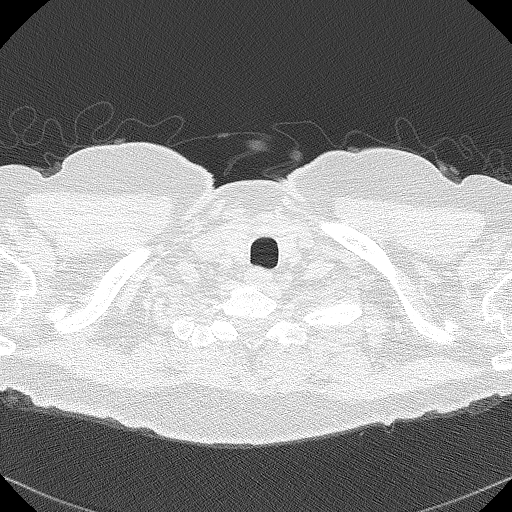

[Series 4: lung · coronal · 0.63mm/px · 3 of 295 slices shown (2 of 2)]
[im 59/295  lung]
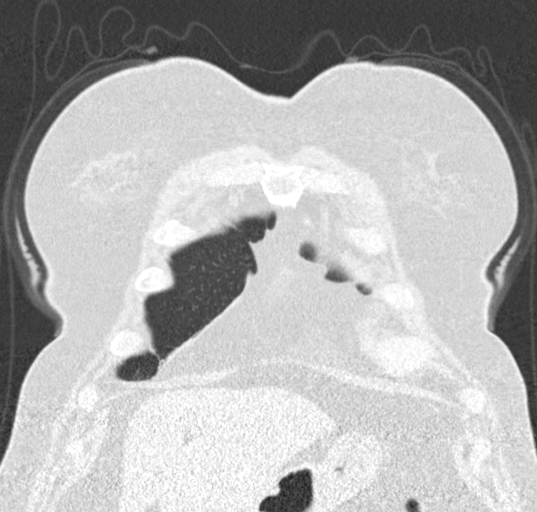
[im 118/295  lung]
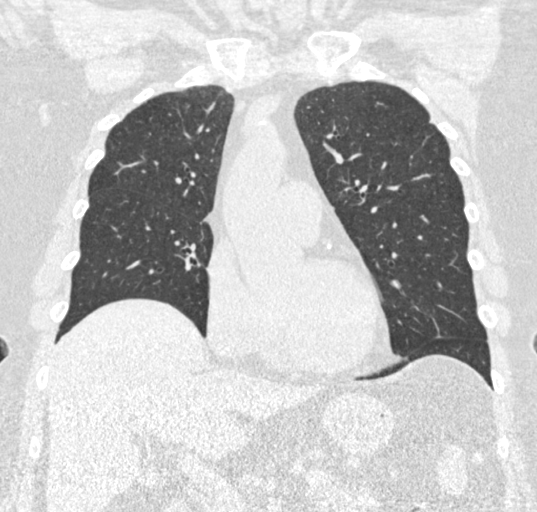
[im 177/295  lung]
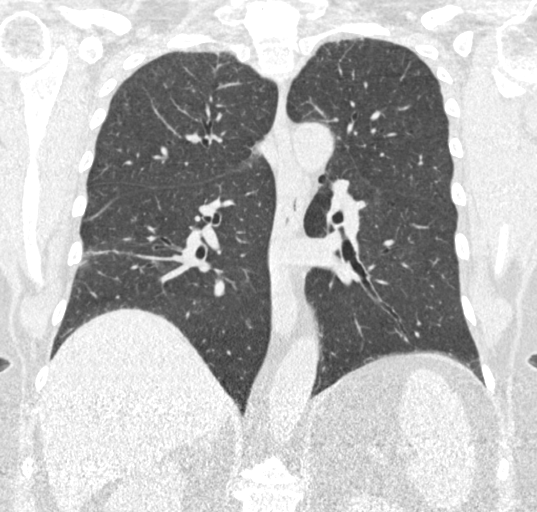

[15 of 40 positions shown; findings below may reference images not displayed]

FINDINGS: Cardiovascular: Normal heart size. No significant pericardial
effusion/thickening. Left anterior descending coronary
atherosclerosis. Atherosclerotic nonaneurysmal thoracic aorta.
Normal caliber pulmonary arteries.

Mediastinum/Nodes: No discrete thyroid nodules. Unremarkable
esophagus. No pathologically enlarged axillary, mediastinal or hilar
lymph nodes, noting limited sensitivity for the detection of hilar
adenopathy on this noncontrast study.

Lungs/Pleura: No pneumothorax. No pleural effusion. Mild
centrilobular and paraseptal emphysema with mild diffuse bronchial
wall thickening. No acute consolidative airspace disease or lung
masses. No significant growth of any of the previously visualized
scattered small pulmonary nodules. No new significant pulmonary
nodules.

Upper abdomen: No acute abnormality.

Musculoskeletal: No aggressive appearing focal osseous lesions.
Moderate thoracic spondylosis.
IMPRESSION: 1. Lung-RADS 2, benign appearance or behavior. Continue annual
screening with low-dose chest CT without contrast in 12 months.
2. One vessel coronary atherosclerosis.

Aortic Atherosclerosis (8JJWD-2QD.D) and Emphysema (8JJWD-JP4.Q).

## 2020-01-15 ENCOUNTER — Other Ambulatory Visit: Payer: Self-pay | Admitting: Internal Medicine

## 2020-02-05 ENCOUNTER — Telehealth: Payer: Self-pay | Admitting: Internal Medicine

## 2020-02-05 NOTE — Telephone Encounter (Signed)
  Patient Consent for Virtual Visit         Kathy Gregory has provided verbal consent on 02/05/2020 for a virtual visit (video or telephone).   CONSENT FOR VIRTUAL VISIT FOR:  Kathy Gregory  By participating in this virtual visit I agree to the following:  I hereby voluntarily request, consent and authorize [facility] and its employed or contracted physicians, physician assistants, nurse practitioners or other licensed health care professionals (the Practitioner), to provide me with telemedicine health care services (the "Services") as deemed necessary by the treating Practitioner. I acknowledge and consent to receive the Services by the Practitioner via telemedicine. I understand that the telemedicine visit will involve communicating with the Practitioner through live audiovisual communication technology and the disclosure of certain medical information by electronic transmission. I acknowledge that I have been given the opportunity to request an in-person assessment or other available alternative prior to the telemedicine visit and am voluntarily participating in the telemedicine visit.  I understand that I have the right to withhold or withdraw my consent to the use of telemedicine in the course of my care at any time, without affecting my right to future care or treatment, and that the Practitioner or I may terminate the telemedicine visit at any time. I understand that I have the right to inspect all information obtained and/or recorded in the course of the telemedicine visit and may receive copies of available information for a reasonable fee.  I understand that some of the potential risks of receiving the Services via telemedicine include:  Marland Kitchen Delay or interruption in medical evaluation due to technological equipment failure or disruption; . Information transmitted may not be sufficient (e.g. poor resolution of images) to allow for appropriate medical decision making by the Practitioner;  and/or  . In rare instances, security protocols could fail, causing a breach of personal health information.  Furthermore, I acknowledge that it is my responsibility to provide information about my medical history, conditions and care that is complete and accurate to the best of my ability. I acknowledge that Practitioner's advice, recommendations, and/or decision may be based on factors not within their control, such as incomplete or inaccurate data provided by me or distortions of diagnostic images or specimens that may result from electronic transmissions. I understand that the practice of medicine is not an exact science and that Practitioner makes no warranties or guarantees regarding treatment outcomes. I acknowledge that a copy of this consent can be made available to me via my patient portal ([facility]), or I can request a printed copy by calling the office of [facility].    I understand that my insurance will be billed for this visit.   I have read or had this consent read to me. . I understand the contents of this consent, which adequately explains the benefits and risks of the Services being provided via telemedicine.  . I have been provided ample opportunity to ask questions regarding this consent and the Services and have had my questions answered to my satisfaction. . I give my informed consent for the services to be provided through the use of telemedicine in my medical care

## 2020-02-06 ENCOUNTER — Telehealth (INDEPENDENT_AMBULATORY_CARE_PROVIDER_SITE_OTHER): Payer: BC Managed Care – PPO | Admitting: Internal Medicine

## 2020-02-06 ENCOUNTER — Encounter: Payer: Self-pay | Admitting: Internal Medicine

## 2020-02-06 VITALS — BP 116/67 | HR 74 | Ht 67.5 in | Wt 214.0 lb

## 2020-02-06 DIAGNOSIS — E785 Hyperlipidemia, unspecified: Secondary | ICD-10-CM | POA: Diagnosis not present

## 2020-02-06 DIAGNOSIS — R002 Palpitations: Secondary | ICD-10-CM | POA: Diagnosis not present

## 2020-02-06 DIAGNOSIS — I2510 Atherosclerotic heart disease of native coronary artery without angina pectoris: Secondary | ICD-10-CM

## 2020-02-06 NOTE — Progress Notes (Signed)
Virtual Visit via Video Note   This visit type was conducted due to national recommendations for restrictions regarding the COVID-19 Pandemic (e.g. social distancing) in an effort to limit this patient's exposure and mitigate transmission in our community.  Due to her co-morbid illnesses, this patient is at least at moderate risk for complications without adequate follow up.  This format is felt to be most appropriate for this patient at this time.  All issues noted in this document were discussed and addressed.  A limited physical exam was performed with this format.  Please refer to the patient's chart for her consent to telehealth for [facility].   The patient was identified using 2 identifiers.  Date:  02/06/2020   ID:  Kathy Gregory, DOB September 04, 1950, MRN [id number]  Patient Location: Home Provider Location: Home  PCP:  Delsa Grana, PA-C  Cardiologist:  Nelva Bush, MD Electrophysiologist:  None   Evaluation Performed:  Follow-Up Visit  Chief Complaint:  Follow-up coronary artery calcification  History of Present Illness:    Kathy Gregory is a 69 y.o. female with hyperlipidemia, obstructive sleep apnea, tobacco use, arthritis, migraine headaches, depression/anxiety, and indecently noted coronary artery calcification.  Today, Kathy Gregory reports feeling well other than foot pain that she attributes to plantar fasciitis (she had this about 20 years ago as well).  She denies chest pain, shortness of breath, lightheadedness, and edema.  She has sporadic skipped beats which are stable.  Mild chronic fatigue is unchanged.  She is trying to walk on the treadmill at least 15 minutes/day.  The patient does not have symptoms concerning for COVID-19 infection (fever, chills, cough, or new shortness of breath).    Past Medical History:  Diagnosis Date  . Anxiety   . Arthritis    hips, collarbone  . Depression   . Heart murmur 07/24/2016  . Hyperlipidemia   . Migraines    4-5  per year  . Other fatigue 12/29/2017  . Scoliosis    mild  . Sleep apnea   . Vertigo    2-3x/yr   Past Surgical History:  Procedure Laterality Date  . BREAST CYST ASPIRATION Left   . COLONOSCOPY  2006  . COLONOSCOPY WITH PROPOFOL N/A 05/15/2015   Procedure: COLONOSCOPY WITH PROPOFOL;  Surgeon: Lucilla Lame, MD;  Location: [place];  Service: Endoscopy;  Laterality: N/A;  . POLYPECTOMY  05/15/2015   Procedure: POLYPECTOMY;  Surgeon: Lucilla Lame, MD;  Location: [place];  Service: Endoscopy;;     No outpatient medications have been marked as taking for the 02/06/20 encounter (Appointment) with Holland Nickson, Harrell Gave, MD.     Allergies:   Patient has no known allergies.   Social History   Tobacco Use  . Smoking status: Current Every Day Smoker    Packs/day: 0.50    Years: 47.00    Pack years: 23.50    Types: Cigarettes  . Smokeless tobacco: Never Used  Vaping Use  . Vaping Use: Never used  Substance Use Topics  . Alcohol use: Yes    Alcohol/week: 1.0 standard drink    Types: 1 Glasses of wine per week    Comment: occ  . Drug use: No     Family Hx: The patient's family history includes Breast cancer in her paternal grandmother; Cirrhosis in her brother; Diabetes in her brother and mother; Heart disease (age of onset: 9) in her father; Hypertension in her brother and mother; Kidney disease in her maternal grandmother; Stroke (age  of onset: 84) in her father.  ROS:   Please see the history of present illness.   All other systems reviewed and are negative.   Prior CV studies:   The following studies were reviewed today:  Pharmacologic MPI (01/05/2018): Normal study without ischemia or scar.  LVEF 55 to 65%.  Labs/Other Tests and Data Reviewed:    EKG:  No ECG reviewed.  Recent Labs: 10/31/2019: ALT 23; BUN 15; Creat 0.79; Hemoglobin 15.1; Platelets 276; Potassium 4.2; Sodium 142   Recent Lipid Panel Lab Results  Component Value Date/Time   CHOL 134  10/31/2019 08:49 AM   TRIG 83 10/31/2019 08:49 AM   HDL 52 10/31/2019 08:49 AM   CHOLHDL 2.6 10/31/2019 08:49 AM   LDLCALC 65 10/31/2019 08:49 AM    Wt Readings from Last 3 Encounters:  10/31/19 218 lb 12.8 oz (99.2 kg)  07/02/19 208 lb (94.3 kg)  06/11/19 208 lb (94.3 kg)     Objective:    Vital Signs:  There were no vitals taken for this visit.   VITAL SIGNS:  reviewed GEN:  no acute distress  ASSESSMENT & PLAN:    Coronary artery disease: Kathy Gregory continues to do well without angina.  Myocardial perfusion stress test in 2019 was low risk without evidence of ischemia or scar.  We will continue aspirin and atorvastatin at current doses.  I encouraged Kathy Gregory to continue exercising.  Hyperlipidemia: LDL well-controlled on most recent lipid panel in March (65).  Continue atorvastatin 20 mg daily.  Palpitations: Brief and self-limited without associated symptoms.  No further workup or intervention planned at this time.  Time:   Today, I have spent 8 minutes with the patient with telehealth technology discussing the above problems.     Medication Adjustments/Labs and Tests Ordered: Current medicines are reviewed at length with the patient today.  Concerns regarding medicines are outlined above.   Tests Ordered: None.  Medication Changes: None.  Follow Up:  In Person in 1 year(s)  Signed, Nelva Bush, MD  02/06/2020 7:56 AM    [place]

## 2020-02-06 NOTE — Patient Instructions (Signed)
Medication Instructions:  Your physician recommends that you continue on your current medications as directed. Please refer to the Current Medication list given to you today.  *If you need a refill on your cardiac medications before your next appointment, please call your pharmacy*   Follow-Up: At [facility], you and your health needs are our priority.  As part of our continuing mission to provide you with exceptional heart care, we have created designated Provider Care Teams.  These Care Teams include your primary Cardiologist (physician) and Advanced Practice Providers (APPs -  Physician Assistants and Nurse Practitioners) who all work together to provide you with the care you need, when you need it.  We recommend signing up for the patient portal called "MyChart".  Sign up information is provided on this After Visit Summary.  MyChart is used to connect with patients for Virtual Visits (Telemedicine).  Patients are able to view lab/test results, encounter notes, upcoming appointments, etc.  Non-urgent messages can be sent to your provider as well.   To learn more about what you can do with MyChart, go to [email or web address].    Your next appointment:   12 month(s)  The format for your next appointment:   In Person  Provider:    You may see DR CHRISTOPHER END or one of the following Advanced Practice Providers on your designated Care Team:    Christopher Berge, NP  Ryan Dunn, PA-C  Jacquelyn Visser, PA-C   

## 2020-02-18 DIAGNOSIS — G4733 Obstructive sleep apnea (adult) (pediatric): Secondary | ICD-10-CM | POA: Diagnosis not present

## 2020-04-09 ENCOUNTER — Other Ambulatory Visit: Payer: Self-pay | Admitting: Internal Medicine

## 2020-04-30 ENCOUNTER — Other Ambulatory Visit: Payer: Self-pay | Admitting: Family Medicine

## 2020-04-30 DIAGNOSIS — Z1231 Encounter for screening mammogram for malignant neoplasm of breast: Secondary | ICD-10-CM

## 2020-05-05 NOTE — Progress Notes (Signed)
Patient: Kathy Gregory, Female    DOB: 1950-10-31, 69 y.o.   MRN: [id number] Delsa Grana, PA-C Visit Date: 05/06/2020  Today's Provider: Delsa Grana, PA-C   Chief Complaint  Patient presents with   Annual Exam   Subjective:   Annual physical exam:  Kathy Gregory is a 69 y.o. female who presents today for complete physical exam:  Exercise/Activity:  3 days a week for 10 minutes She's been trying to get back to being on the treadmill and got plantar fasciitis Diet/nutrition:  Tries to eat chicken, fish and veggies Sleep:  Trazodone helps with sleep   USPSTF grade A and B recommendations - reviewed and addressed today  Depression:  Phq 9 completed today by patient, was reviewed by me with patient in the room PHQ score is neg, pt feels good Still has occasional panic attacks - she has Rx of klonopin since last year - still has 3/15 pills left request refill today  PHQ 2/9 Scores 05/06/2020 10/31/2019 07/02/2019 05/03/2019  PHQ - 2 Score 0 0 0 0  PHQ- 9 Score - 0 0 0   Depression screen [facility] 2/9 05/06/2020 10/31/2019 07/02/2019 05/03/2019 10/26/2018  Decreased Interest 0 0 0 0 0  Down, Depressed, Hopeless 0 0 0 0 0  PHQ - 2 Score 0 0 0 0 0  Altered sleeping - 0 0 0 0  Tired, decreased energy - 0 0 0 0  Change in appetite - 0 0 0 0  Feeling bad or failure about yourself  - 0 0 0 0  Trouble concentrating - 0 0 0 0  Moving slowly or fidgety/restless - 0 0 0 0  Suicidal thoughts - 0 0 0 0  PHQ-9 Score - 0 0 0 0  Difficult doing work/chores - Not difficult at all Not difficult at all Not difficult at all Not difficult at all    Alcohol screening:   [facility] Visit from 05/06/2020 in [facility]  AUDIT-C Score 1      Immunizations and Health Maintenance: Health Maintenance  Topic Date Due   COLONOSCOPY  05/14/2020   MAMMOGRAM  06/10/2020   TETANUS/TDAP  02/06/2022   INFLUENZA VACCINE  Completed   DEXA SCAN  Completed   COVID-19 Vaccine   Completed   Hepatitis C Screening  Completed   PNA vac Low Risk Adult  Completed     Hep C Screening: done  STD testing and prevention (HIV/chl/gon/syphilis):  see above, no additional testing desired by pt today  Intimate partner violence:  None, widowed, she feels safe  Sexual History/Pain during Intercourse: Widowed  Menstrual History/LMP/Abnormal Bleeding: no AUB No LMP recorded. Patient is postmenopausal.  Incontinence Symptoms:  None bothersome or daily, very rare urge incontinence  Breast cancer:  Last Mammogram: scheduled  BRCA gene screening: none known  Cervical cancer screening: 04/20/17 SATISFACTORY. The presence or absence of endocervical/transformation  zone component cannot be determined because of atrophy.  The specimen is partially obscured by inflammation.  Pt denies family hx of cancers - breast, ovarian, uterine, colon:     Osteoporosis:   Discussion on osteoporosis per age, including high calcium and vitamin D supplementation, weight bearing exercises Pt is supplementing with daily calcium/Vit D. 06/11/19 Bone scan/dexa Osteopenia- last labs March 2021 showed good calcium/vit D  Skin cancer:  Hx of skin CA -  NO Discussed atypical lesions   Colorectal cancer:   Colonoscopy is ordered- f/up with GI q 5 years - saw Dr.  Wohl in [place] but may need in [place] for transportation Discussed concerning signs and sx of CRC, pt denies change in BM melena or hematochezia  Lung cancer:   Low Dose CT Chest recommended if Age 68-80 years, 30 pack-year currently smoking OR have quit w/in 15years. Patient does qualify.    Social History   Tobacco Use   Smoking status: Current Every Day Smoker    Packs/day: 0.50    Years: 47.00    Pack years: 23.50    Types: Cigarettes   Smokeless tobacco: Never Used  Scientific laboratory technician Use: Never used  Substance Use Topics   Alcohol use: Yes    Alcohol/week: 1.0 standard drink    Types: 1 Glasses of wine per  week    Comment: occ   Drug use: No       [facility] from 05/06/2020 in [facility]  AUDIT-C Score 1      Family History  Problem Relation Age of Onset   Hypertension Mother    Diabetes Mother    Heart disease Father 42       CABG   Stroke Father 46       Occurred during CABG   Breast cancer Paternal Grandmother    Diabetes Brother    Hypertension Brother    Kidney disease Maternal Grandmother    Cirrhosis Brother      Blood pressure/Hypertension: BP Readings from Last 3 Encounters:  05/06/20 128/74  02/06/20 116/67  10/31/19 126/86    Weight/Obesity: Wt Readings from Last 3 Encounters:  05/06/20 221 lb 1.6 oz (100.3 kg)  02/06/20 214 lb (97.1 kg)  10/31/19 218 lb 12.8 oz (99.2 kg)   BMI Readings from Last 3 Encounters:  05/06/20 33.62 kg/m  02/06/20 33.02 kg/m  10/31/19 34.27 kg/m     Lipids:  Lab Results  Component Value Date   CHOL 134 10/31/2019   CHOL 140 05/03/2019   CHOL 133 10/26/2018   Lab Results  Component Value Date   HDL 52 10/31/2019   HDL 52 05/03/2019   HDL 52 10/26/2018   Lab Results  Component Value Date   LDLCALC 65 10/31/2019   LDLCALC 72 05/03/2019   LDLCALC 65 10/26/2018   Lab Results  Component Value Date   TRIG 83 10/31/2019   TRIG 75 05/03/2019   TRIG 78 10/26/2018   Lab Results  Component Value Date   CHOLHDL 2.6 10/31/2019   CHOLHDL 2.7 05/03/2019   CHOLHDL 2.6 10/26/2018   No results found for: LDLDIRECT Based on the results of lipid panel his/her cardiovascular risk factor ( using [place] )  in the next 10 years is: The 10-year ASCVD risk score Mikey Bussing DC Brooke Bonito., et al., 2013) is: 11.3%   Values used to calculate the score:     Age: 69 years     Sex: Female     Is Non-Hispanic African American: No     Diabetic: No     Tobacco smoker: Yes     Systolic Blood Pressure: 485 mmHg     Is BP treated: No     HDL Cholesterol: 52 mg/dL     Total Cholesterol: 134 mg/dL Glucose:    Glucose, Bld  Date Value Ref Range Status  10/31/2019 79 65 - 99 mg/dL Final    Comment:    .            Fasting reference interval .   05/03/2019 80 65 - 99  mg/dL Final    Comment:    .            Fasting reference interval .   10/26/2018 80 65 - 99 mg/dL Final    Comment:    .            Fasting reference interval .    Hypertension: BP Readings from Last 3 Encounters:  05/06/20 128/74  02/06/20 116/67  10/31/19 126/86   Obesity: Wt Readings from Last 3 Encounters:  05/06/20 221 lb 1.6 oz (100.3 kg)  02/06/20 214 lb (97.1 kg)  10/31/19 218 lb 12.8 oz (99.2 kg)   BMI Readings from Last 3 Encounters:  05/06/20 33.62 kg/m  02/06/20 33.02 kg/m  10/31/19 34.27 kg/m      Advanced Care Planning:  A voluntary discussion about advance care planning including the explanation and discussion of advance directives.   Discussed health care proxy and Living will, and the patient was able to identify a health care proxy as Almyra Free her sister.   Patient does not have a living will at present time.   Lengthy discussion today regarding advanced care planning, advanced directive, POA, living will, packet given and reviewed - encouraged to complete and bring a copy in for [place] to put into her chart  Social History      She        Social History   Socioeconomic History   Marital status: Widowed    Spouse name: Not on file   Number of children: Not on file   Years of education: Not on file   Highest education level: Not on file  Occupational History   Not on file  Tobacco Use   Smoking status: Current Every Day Smoker    Packs/day: 0.50    Years: 47.00    Pack years: 23.50    Types: Cigarettes   Smokeless tobacco: Never Used  Vaping Use   Vaping Use: Never used  Substance and Sexual Activity   Alcohol use: Yes    Alcohol/week: 1.0 standard drink    Types: 1 Glasses of wine per week    Comment: occ   Drug use: No   Sexual activity: Never  Other Topics  Concern   Not on file  Social History Narrative   Not on file   Social Determinants of Health   Financial Resource Strain: Low Risk    Difficulty of Paying Living Expenses: Not hard at all  Food Insecurity: No Food Insecurity   Worried About Charity fundraiser in the Last Year: Never true   [place] in the Last Year: Never true  Transportation Needs: No Transportation Needs   Lack of Transportation (Medical): No   Lack of Transportation (Non-Medical): No  Physical Activity: Insufficiently Active   Days of Exercise per Week: 3 days   Minutes of Exercise per Session: 10 min  Stress: No Stress Concern Present   Feeling of Stress : Only a little  Social Connections: Socially Isolated   Frequency of Communication with Friends and Family: More than three times a week   Frequency of Social Gatherings with Friends and Family: Never   Attends Religious Services: Never   Marine scientist or Organizations: No   Attends Archivist Meetings: Never   Marital Status: Widowed    Family History        Family History  Problem Relation Age of Onset   Hypertension Mother    Diabetes Mother  Heart disease Father 67       CABG   Stroke Father 44       Occurred during CABG   Breast cancer Paternal Grandmother    Diabetes Brother    Hypertension Brother    Kidney disease Maternal Grandmother    Cirrhosis Brother     Patient Active Problem List   Diagnosis Date Noted   Sleep apnea 10/26/2018   Obesity (BMI 30.0-34.9) 01/18/2017   Anxiety attack 01/18/2017   Tobacco abuse 01/18/2017   Coronary artery disease 05/23/2016   Aortic atherosclerosis ([place]) 05/23/2016   Centrilobular emphysema ([place]) 05/23/2016   Osteopenia 04/18/2016   Hx of colonic polyps    Benign neoplasm of sigmoid colon    Personal history of tobacco use, presenting hazards to health 04/13/2015   Hyperlipidemia LDL goal <70 08/07/2014    Past Surgical  History:  Procedure Laterality Date   BREAST CYST ASPIRATION Left    COLONOSCOPY  2006   COLONOSCOPY WITH PROPOFOL N/A 05/15/2015   Procedure: COLONOSCOPY WITH PROPOFOL;  Surgeon: Lucilla Lame, MD;  Location: [facility];  Service: Endoscopy;  Laterality: N/A;   POLYPECTOMY  05/15/2015   Procedure: POLYPECTOMY;  Surgeon: Lucilla Lame, MD;  Location: [facility];  Service: Endoscopy;;     Current Outpatient Medications:    aspirin EC 81 MG tablet, Take 1 tablet (81 mg total) by mouth daily., Disp: 90 tablet, Rfl: 3   atorvastatin (LIPITOR) 20 MG tablet, TAKE 1 TABLET (20 MG TOTAL) BY MOUTH DAILY, Disp: 90 tablet, Rfl: 0   clonazePAM (KLONOPIN) 0.5 MG tablet, Take 0.5-1 tablets (0.25-0.5 mg total) by mouth 2 (two) times daily as needed for anxiety. Never take within six hours of alcohol, Disp: 15 tablet, Rfl: 0   Fish Oil-Cholecalciferol (FISH OIL + D3) 1000-1000 MG-UNIT CAPS, Take by mouth daily. , Disp: , Rfl:    Multiple Minerals-Vitamins (CALCIUM & VIT D3 BONE HEALTH PO), Take 2 tablets by mouth daily. Calcium $RemoveBefore'600mg'BIiztbGulKaUe$ - Vitamin D $RemoveB'400mg'vNsCfjBU$ , Disp: , Rfl:    psyllium (REGULOID) 0.52 G capsule, Take 0.52 g by mouth daily., Disp: , Rfl:    traZODone (DESYREL) 50 MG tablet, Take 1 tablet (50 mg total) by mouth at bedtime as needed for sleep., Disp: 90 tablet, Rfl: 3  No Known Allergies  Patient Care Team: Delsa Grana, PA-C as PCP - General (Family Medicine) Laverle Hobby, MD as Consulting Physician (Pulmonary Disease) End, Harrell Gave, MD as Consulting Physician (Cardiology)  Review of Systems   I personally reviewed active problem list, medication list, allergies, family history, social history, health maintenance, notes from last encounter, lab results, imaging with the patient/caregiver today.        Objective:   Vitals:  Vitals:   05/06/20 0907  BP: 128/74  Pulse: 100  Resp: 16  Temp: 98.4 F (36.9 C)  TempSrc: Oral  SpO2: 96%  Weight: 221 lb  1.6 oz (100.3 kg)  Height: $Remove'5\' 8"'gevyoLo$  (1.727 m)    Body mass index is 33.62 kg/m.  Physical Exam Vitals and nursing note reviewed.  Constitutional:      General: She is not in acute distress.    Appearance: Normal appearance. She is well-developed. She is obese. She is not ill-appearing, toxic-appearing or diaphoretic.     Interventions: Face mask in place.  HENT:     Head: Normocephalic and atraumatic.     Right Ear: External ear normal.     Left Ear: External ear normal.     Nose:  Nose normal.     Mouth/Throat:     Mouth: Mucous membranes are moist.     Pharynx: Oropharynx is clear.  Eyes:     General: Lids are normal. No scleral icterus.       Right eye: No discharge.        Left eye: No discharge.     Conjunctiva/sclera: Conjunctivae normal.     Pupils: Pupils are equal, round, and reactive to light.  Neck:     Thyroid: No thyroid mass, thyromegaly or thyroid tenderness.     Trachea: Trachea and phonation normal. No tracheal deviation.  Cardiovascular:     Rate and Rhythm: Normal rate and regular rhythm.     Pulses: Normal pulses.          Radial pulses are 2+ on the right side and 2+ on the left side.       Posterior tibial pulses are 2+ on the right side and 2+ on the left side.     Heart sounds: Normal heart sounds. No murmur heard.  No friction rub. No gallop.   Pulmonary:     Effort: Pulmonary effort is normal. No respiratory distress.     Breath sounds: Normal breath sounds. No stridor. No wheezing, rhonchi or rales.  Chest:     Chest wall: No tenderness.  Abdominal:     General: Bowel sounds are normal. There is no distension.     Palpations: Abdomen is soft.  Musculoskeletal:     Cervical back: Normal range of motion.     Right lower leg: No edema.     Left lower leg: No edema.  Skin:    General: Skin is warm and dry.     Coloration: Skin is not jaundiced or pale.     Findings: No rash.  Neurological:     Mental Status: She is alert.     Motor: No abnormal  muscle tone.     Gait: Gait normal.  Psychiatric:        Mood and Affect: Mood normal.        Speech: Speech normal.        Behavior: Behavior normal.       Fall Risk: Fall Risk  05/06/2020 10/31/2019 07/02/2019 05/03/2019 10/26/2018  Falls in the past year? 1 0 0 0 0  Number falls in past yr: 0 0 0 0 0  Injury with Fall? 0 0 0 0 0  Follow up Falls evaluation completed - - - -    Functional Status Survey: Is the patient deaf or have difficulty hearing?: No Does the patient have difficulty seeing, even when wearing glasses/contacts?: No Does the patient have difficulty concentrating, remembering, or making decisions?: No Does the patient have difficulty walking or climbing stairs?: No Does the patient have difficulty dressing or bathing?: No Does the patient have difficulty doing errands alone such as visiting a doctor's office or shopping?: No   Assessment & Plan:    CPE completed today   USPSTF grade A and B recommendations reviewed with patient; age-appropriate recommendations, preventive care, screening tests, etc discussed and encouraged; healthy living encouraged; see AVS for patient education given to patient   Discussed importance of 150 minutes of physical activity weekly, AHA exercise recommendations given to pt in AVS/handout   Discussed importance of healthy diet:  eating lean meats and proteins, avoiding trans fats and saturated fats, avoid simple sugars and excessive carbs in diet, eat 6 servings of fruit/vegetables daily and drink plenty  of water and avoid sweet beverages.     Recommended pt to do annual eye exam and routine dental exams/cleanings   Depression, alcohol, fall screening completed as documented above and per flowsheets - reviewed today    Reviewed Health Maintenance: Health Maintenance  Topic Date Due   COLONOSCOPY  05/14/2020   MAMMOGRAM  06/10/2020   DEXA SCAN  06/10/2021   TETANUS/TDAP  02/06/2022   INFLUENZA VACCINE  Completed    COVID-19 Vaccine  Completed   Hepatitis C Screening  Completed   PNA vac Low Risk Adult  Completed     Immunizations: Immunization History  Administered Date(s) Administered   Fluad Quad(high Dose 65+) 05/03/2019, 05/06/2020   Influenza, High Dose Seasonal PF 04/26/2018   Influenza,inj,Quad PF,6+ Mos 04/18/2016, 04/20/2017   PFIZER SARS-COV-2 Vaccination 10/11/2019, 10/11/2019, 11/01/2019   Pneumococcal Conjugate-13 04/26/2018   Pneumococcal Polysaccharide-23 08/19/2013, 05/03/2019   Tdap 02/07/2012   Zoster Recombinat (Shingrix) 06/15/2018, 08/27/2018    Biometric screening paperwork for work done today - pending lab results and will be faxed in for her - signed consent     ICD-10-CM   1. Annual physical exam  Z00.00 CBC with Differential/Platelet    COMPLETE METABOLIC PANEL WITH GFR    Lipid panel    Hemoglobin A1c  2. Osteopenia of neck of femur, unspecified laterality  G25.638 COMPLETE METABOLIC PANEL WITH GFR   reviewed past dexa - continue calcium and vit D supplements and weight bearing activity  3. Aortic atherosclerosis (HCC)  I70.0 Lipid panel    atorvastatin (LIPITOR) 20 MG tablet   noted on CT scans, no current concerning cardiac sx, encouraged heart healthy diet/lifestyle and d/c smoking, on statin, monitoring  4. Hyperlipidemia LDL goal <70  L37.3 COMPLETE METABOLIC PANEL WITH GFR    Lipid panel    atorvastatin (LIPITOR) 20 MG tablet   tolerating statin, recheck labs with CPE and then will monitor lipids annually - encouraged continued diet/lifestyle  5. Coronary artery disease involving native coronary artery of native heart without angina pectoris  S28.76 COMPLETE METABOLIC PANEL WITH GFR    Lipid panel    atorvastatin (LIPITOR) 20 MG tablet   noted on CT scans, no current concerning cardiac sx, encouraged heart healthy diet/lifestyle and d/c smoking, on statin, monitoring  6. Tobacco abuse  Z72.0 CBC with Differential/Platelet   see below   7.  Centrilobular emphysema (HCC)  J43.2    per low dose CT screening, no current bothersome or daily sx, encouraged smoking cessation, monitoring sx, f/up as needed  8. Insomnia, unspecified type  G47.00    well controlled with trazodone  9. Obesity (BMI 30.0-34.9)  E66.9 CBC with Differential/Platelet    COMPLETE METABOLIC PANEL WITH GFR    Lipid panel    Hemoglobin A1c   encouraged continued healthy diet, calorie reduction, increase exercise as able  10. Screening for malignant neoplasm of colon  Z12.11 Ambulatory referral to Gastroenterology   pt due to f/up colonoscopy this month, she needs to switch to [facility] in [place] location due to transportation since death of husband, no sx  11. Panic attacks  F41.0 clonazePAM (KLONOPIN) 0.5 MG tablet   well controlled with infrequent use of klonopin, slightly worse since COVID, verified meds, gave refill  12. Elevated hematocrit  R71.8 CBC with Differential/Platelet   on last labs, will monitor, she is compliant with CPAP, likely due to smoking  13. OSA on CPAP  G47.33 CBC with Differential/Platelet   Z99.89  pt reports good compliance  14. Need for influenza vaccination  Z23 Flu Vaccine QUAD High Dose(Fluad)     Smoking cessation instruction/counseling given:  counseled patient on the dangers of tobacco use, advised patient to stop smoking, and reviewed strategies to maximize success  Pt declined any referrals or meds at this time - told her the offer is always open   Delsa Grana, PA-C 05/06/20 3:19 PM  [place]

## 2020-05-06 ENCOUNTER — Ambulatory Visit (INDEPENDENT_AMBULATORY_CARE_PROVIDER_SITE_OTHER): Payer: BC Managed Care – PPO | Admitting: Family Medicine

## 2020-05-06 ENCOUNTER — Other Ambulatory Visit: Payer: Self-pay

## 2020-05-06 ENCOUNTER — Encounter: Payer: Self-pay | Admitting: Family Medicine

## 2020-05-06 VITALS — BP 128/74 | HR 100 | Temp 98.4°F | Resp 16 | Ht 68.0 in | Wt 221.1 lb

## 2020-05-06 DIAGNOSIS — Z23 Encounter for immunization: Secondary | ICD-10-CM

## 2020-05-06 DIAGNOSIS — R718 Other abnormality of red blood cells: Secondary | ICD-10-CM

## 2020-05-06 DIAGNOSIS — Z0000 Encounter for general adult medical examination without abnormal findings: Secondary | ICD-10-CM | POA: Diagnosis not present

## 2020-05-06 DIAGNOSIS — I700 Atherosclerosis of aorta: Secondary | ICD-10-CM | POA: Diagnosis not present

## 2020-05-06 DIAGNOSIS — Z720 Tobacco use: Secondary | ICD-10-CM

## 2020-05-06 DIAGNOSIS — J432 Centrilobular emphysema: Secondary | ICD-10-CM

## 2020-05-06 DIAGNOSIS — Z9989 Dependence on other enabling machines and devices: Secondary | ICD-10-CM

## 2020-05-06 DIAGNOSIS — Z1211 Encounter for screening for malignant neoplasm of colon: Secondary | ICD-10-CM

## 2020-05-06 DIAGNOSIS — I2510 Atherosclerotic heart disease of native coronary artery without angina pectoris: Secondary | ICD-10-CM | POA: Diagnosis not present

## 2020-05-06 DIAGNOSIS — D72829 Elevated white blood cell count, unspecified: Secondary | ICD-10-CM

## 2020-05-06 DIAGNOSIS — E785 Hyperlipidemia, unspecified: Secondary | ICD-10-CM | POA: Diagnosis not present

## 2020-05-06 DIAGNOSIS — F410 Panic disorder [episodic paroxysmal anxiety] without agoraphobia: Secondary | ICD-10-CM

## 2020-05-06 DIAGNOSIS — E669 Obesity, unspecified: Secondary | ICD-10-CM

## 2020-05-06 DIAGNOSIS — M85859 Other specified disorders of bone density and structure, unspecified thigh: Secondary | ICD-10-CM

## 2020-05-06 DIAGNOSIS — G4700 Insomnia, unspecified: Secondary | ICD-10-CM

## 2020-05-06 DIAGNOSIS — G4733 Obstructive sleep apnea (adult) (pediatric): Secondary | ICD-10-CM

## 2020-05-06 MED ORDER — ATORVASTATIN CALCIUM 20 MG PO TABS: 20.0000 mg | ORAL_TABLET | Freq: Every day | ORAL | 3 refills | Status: DC

## 2020-05-06 MED ORDER — CLONAZEPAM 0.5 MG PO TABS: 0.2500 mg | ORAL_TABLET | Freq: Two times a day (BID) | ORAL | 0 refills | Status: DC | PRN

## 2020-05-06 NOTE — Patient Instructions (Signed)
Preventive Care 68 Years and Older, Female Preventive care refers to lifestyle choices and visits with your health care provider that can promote health and wellness. This includes:  A yearly physical exam. This is also called an annual well check.  Regular dental and eye exams.  Immunizations.  Screening for certain conditions.  Healthy lifestyle choices, such as diet and exercise. What can I expect for my preventive care visit? Physical exam Your health care provider will check:  Height and weight. These may be used to calculate body mass index (BMI), which is a measurement that tells if you are at a healthy weight.  Heart rate and blood pressure.  Your skin for abnormal spots. Counseling Your health care provider may ask you questions about:  Alcohol, tobacco, and drug use.  Emotional well-being.  Home and relationship well-being.  Sexual activity.  Eating habits.  History of falls.  Memory and ability to understand (cognition).  Work and work Statistician.  Pregnancy and menstrual history. What immunizations do I need?  Influenza (flu) vaccine  This is recommended every year. Tetanus, diphtheria, and pertussis (Tdap) vaccine  You may need a Td booster every 10 years. Varicella (chickenpox) vaccine  You may need this vaccine if you have not already been vaccinated. Zoster (shingles) vaccine  You may need this after age 39. Pneumococcal conjugate (PCV13) vaccine  One dose is recommended after age 71. Pneumococcal polysaccharide (PPSV23) vaccine  One dose is recommended after age 21. Measles, mumps, and rubella (MMR) vaccine  You may need at least one dose of MMR if you were born in 1957 or later. You may also need a second dose. Meningococcal conjugate (MenACWY) vaccine  You may need this if you have certain conditions. Hepatitis A vaccine  You may need this if you have certain conditions or if you travel or work in places where you may be exposed  to hepatitis A. Hepatitis B vaccine  You may need this if you have certain conditions or if you travel or work in places where you may be exposed to hepatitis B. Haemophilus influenzae type b (Hib) vaccine  You may need this if you have certain conditions. You may receive vaccines as individual doses or as more than one vaccine together in one shot (combination vaccines). Talk with your health care provider about the risks and benefits of combination vaccines. What tests do I need? Blood tests  Lipid and cholesterol levels. These may be checked every 5 years, or more frequently depending on your overall health.  Hepatitis C test.  Hepatitis B test. Screening  Lung cancer screening. You may have this screening every year starting at age 26 if you have a 30-pack-year history of smoking and currently smoke or have quit within the past 15 years.  Colorectal cancer screening. All adults should have this screening starting at age 35 and continuing until age 51. Your health care provider may recommend screening at age 68 if you are at increased risk. You will have tests every 1-10 years, depending on your results and the type of screening test.  Diabetes screening. This is done by checking your blood sugar (glucose) after you have not eaten for a while (fasting). You may have this done every 1-3 years.  Mammogram. This may be done every 1-2 years. Talk with your health care provider about how often you should have regular mammograms.  BRCA-related cancer screening. This may be done if you have a family history of breast, ovarian, tubal, or peritoneal cancers.  Other tests  Sexually transmitted disease (STD) testing.  Bone density scan. This is done to screen for osteoporosis. You may have this done starting at age 65. Follow these instructions at home: Eating and drinking  Eat a diet that includes fresh fruits and vegetables, whole grains, lean protein, and low-fat dairy products. Limit  your intake of foods with high amounts of sugar, saturated fats, and salt.  Take vitamin and mineral supplements as recommended by your health care provider.  Do not drink alcohol if your health care provider tells you not to drink.  If you drink alcohol: ? Limit how much you have to 0-1 drink a day. ? Be aware of how much alcohol is in your drink. In the [place] one drink equals one 12 oz bottle of beer (355 mL), one 5 oz glass of wine (148 mL), or one 1 oz glass of hard liquor (44 mL). Lifestyle  Take daily care of your teeth and gums.  Stay active. Exercise for at least 30 minutes on 5 or more days each week.  Do not use any products that contain nicotine or tobacco, such as cigarettes, e-cigarettes, and chewing tobacco. If you need help quitting, ask your health care provider.  If you are sexually active, practice safe sex. Use a condom or other form of protection in order to prevent STIs (sexually transmitted infections).  Talk with your health care provider about taking a low-dose aspirin or statin. What's next?  Go to your health care provider once a year for a well check visit.  Ask your health care provider how often you should have your eyes and teeth checked.  Stay up to date on all vaccines. This information is not intended to replace advice given to you by your health care provider. Make sure you discuss any questions you have with your health care provider. Document Revised: 08/02/2018 Document Reviewed: 08/02/2018 Elsevier Patient Education  2020 Elsevier Inc.    Steps to Quit Smoking Smoking tobacco is the leading cause of preventable death. It can affect almost every organ in the body. Smoking puts you and people around you at risk for many serious, long-lasting (chronic) diseases. Quitting smoking can be hard, but it is one of the best things that you can do for your health. It is never too late to quit. How do I get ready to quit? When you decide to quit  smoking, make a plan to help you succeed. Before you quit:  Pick a date to quit. Set a date within the next 2 weeks to give you time to prepare.  Write down the reasons why you are quitting. Keep this list in places where you will see it often.  Tell your family, friends, and co-workers that you are quitting. Their support is important.  Talk with your doctor about the choices that may help you quit.  Find out if your health insurance will pay for these treatments.  Know the people, places, things, and activities that make you want to smoke (triggers). Avoid them. What first steps can I take to quit smoking?  Throw away all cigarettes at home, at work, and in your car.  Throw away the things that you use when you smoke, such as ashtrays and lighters.  Clean your car. Make sure to empty the ashtray.  Clean your home, including curtains and carpets. What can I do to help me quit smoking? Talk with your doctor about taking medicines and seeing a counselor at the same time.   You are more likely to succeed when you do both.  If you are pregnant or breastfeeding, talk with your doctor about counseling or other ways to quit smoking. Do not take medicine to help you quit smoking unless your doctor tells you to do so. To quit smoking: Quit right away  Quit smoking totally, instead of slowly cutting back on how much you smoke over a period of time.  Go to counseling. You are more likely to quit if you go to counseling sessions regularly. Take medicine You may take medicines to help you quit. Some medicines need a prescription, and some you can buy over-the-counter. Some medicines may contain a drug called nicotine to replace the nicotine in cigarettes. Medicines may:  Help you to stop having the desire to smoke (cravings).  Help to stop the problems that come when you stop smoking (withdrawal symptoms). Your doctor may ask you to use:  Nicotine patches, gum, or lozenges.  Nicotine  inhalers or sprays.  Non-nicotine medicine that is taken by mouth. Find resources Find resources and other ways to help you quit smoking and remain smoke-free after you quit. These resources are most helpful when you use them often. They include:  Online chats with a Social worker.  Phone quitlines.  Printed Furniture conservator/restorer.  Support groups or group counseling.  Text messaging programs.  Mobile phone apps. Use apps on your mobile phone or tablet that can help you stick to your quit plan. There are many free apps for mobile phones and tablets as well as websites. Examples include Quit Guide from the [employer] and smokefree.gov  What things can I do to make it easier to quit?   Talk to your family and friends. Ask them to support and encourage you.  Call a phone quitline (1-800-QUIT-NOW), reach out to support groups, or work with a Social worker.  Ask people who smoke to not smoke around you.  Avoid places that make you want to smoke, such as: ? Bars. ? Parties. ? Smoke-break areas at work.  Spend time with people who do not smoke.  Lower the stress in your life. Stress can make you want to smoke. Try these things to help your stress: ? Getting regular exercise. ? Doing deep-breathing exercises. ? Doing yoga. ? Meditating. ? Doing a body scan. To do this, close your eyes, focus on one area of your body at a time from head to toe. Notice which parts of your body are tense. Try to relax the muscles in those areas. How will I feel when I quit smoking? Day 1 to 3 weeks Within the first 24 hours, you may start to have some problems that come from quitting tobacco. These problems are very bad 2-3 days after you quit, but they do not often last for more than 2-3 weeks. You may get these symptoms:  Mood swings.  Feeling restless, nervous, angry, or annoyed.  Trouble concentrating.  Dizziness.  Strong desire for high-sugar foods and nicotine.  Weight gain.  Trouble pooping  (constipation).  Feeling like you may vomit (nausea).  Coughing or a sore throat.  Changes in how the medicines that you take for other issues work in your body.  Depression.  Trouble sleeping (insomnia). Week 3 and afterward After the first 2-3 weeks of quitting, you may start to notice more positive results, such as:  Better sense of smell and taste.  Less coughing and sore throat.  Slower heart rate.  Lower blood pressure.  Clearer skin.  Better  breathing.  Fewer sick days. Quitting smoking can be hard. Do not give up if you fail the first time. Some people need to try a few times before they succeed. Do your best to stick to your quit plan, and talk with your doctor if you have any questions or concerns. Summary  Smoking tobacco is the leading cause of preventable death. Quitting smoking can be hard, but it is one of the best things that you can do for your health.  When you decide to quit smoking, make a plan to help you succeed.  Quit smoking right away, not slowly over a period of time.  When you start quitting, seek help from your doctor, family, or friends. This information is not intended to replace advice given to you by your health care provider. Make sure you discuss any questions you have with your health care provider. Document Revised: 05/03/2019 Document Reviewed: 10/27/2018 Elsevier Patient Education  2020 Elsevier Inc.  

## 2020-05-07 LAB — LIPID PANEL
Cholesterol: 136 mg/dL (ref <200)
HDL: 58 mg/dL (ref >=50)
LDL Cholesterol (Calc): 62 mg/dL (calc)
Non-HDL Cholesterol (Calc): 78 mg/dL (calc) (ref <130)
Total CHOL/HDL Ratio: 2.3 (calc) (ref <5.0)
Triglycerides: 78 mg/dL (ref <150)

## 2020-05-07 LAB — CBC WITH DIFFERENTIAL/PLATELET
Absolute Monocytes: 1037 cells/uL — ABNORMAL HIGH (ref 200–950)
Basophils Absolute: 101 cells/uL (ref 0–200)
Basophils Relative: 0.7 %
Eosinophils Absolute: 187 cells/uL (ref 15–500)
Eosinophils Relative: 1.3 %
HCT: 48.5 % — ABNORMAL HIGH (ref 35.0–45.0)
Hemoglobin: 16.3 g/dL — ABNORMAL HIGH (ref 11.7–15.5)
Lymphs Abs: 2102 cells/uL (ref 850–3900)
MCH: 32.6 pg (ref 27.0–33.0)
MCHC: 33.6 g/dL (ref 32.0–36.0)
MCV: 97 fL (ref 80.0–100.0)
MPV: 11.1 fL (ref 7.5–12.5)
Monocytes Relative: 7.2 %
Neutro Abs: 10973 cells/uL — ABNORMAL HIGH (ref 1500–7800)
Neutrophils Relative %: 76.2 %
Platelets: 288 10*3/uL (ref 140–400)
RBC: 5 10*6/uL (ref 3.80–5.10)
RDW: 12.6 % (ref 11.0–15.0)
Total Lymphocyte: 14.6 %
WBC: 14.4 10*3/uL — ABNORMAL HIGH (ref 3.8–10.8)

## 2020-05-07 LAB — COMPLETE METABOLIC PANEL WITH GFR
AG Ratio: 1.6 (calc) (ref 1.0–2.5)
ALT: 29 U/L (ref 6–29)
AST: 18 U/L (ref 10–35)
Albumin: 4.3 g/dL (ref 3.6–5.1)
Alkaline phosphatase (APISO): 99 U/L (ref 37–153)
BUN: 18 mg/dL (ref 7–25)
CO2: 29 mmol/L (ref 20–32)
Calcium: 10.1 mg/dL (ref 8.6–10.4)
Chloride: 105 mmol/L (ref 98–110)
Creat: 0.93 mg/dL (ref 0.50–0.99)
GFR, Est African American: 73 mL/min/{1.73_m2} (ref >=60)
GFR, Est Non African American: 63 mL/min/{1.73_m2} (ref >=60)
Globulin: 2.7 g/dL (calc) (ref 1.9–3.7)
Glucose, Bld: 79 mg/dL (ref 65–99)
Potassium: 4.8 mmol/L (ref 3.5–5.3)
Sodium: 142 mmol/L (ref 135–146)
Total Bilirubin: 0.4 mg/dL (ref 0.2–1.2)
Total Protein: 7 g/dL (ref 6.1–8.1)

## 2020-05-07 LAB — HEMOGLOBIN A1C
Hgb A1c MFr Bld: 5.8 % of total Hgb — ABNORMAL HIGH (ref <5.7)
Mean Plasma Glucose: 120 (calc)
eAG (mmol/L): 6.6 (calc)

## 2020-05-07 NOTE — Addendum Note (Signed)
Addended by: Delsa Grana on: 05/07/2020 06:23 PM   Modules accepted: Orders

## 2020-05-18 DIAGNOSIS — G4733 Obstructive sleep apnea (adult) (pediatric): Secondary | ICD-10-CM | POA: Diagnosis not present

## 2020-05-21 ENCOUNTER — Other Ambulatory Visit: Payer: Self-pay

## 2020-05-21 ENCOUNTER — Telehealth (INDEPENDENT_AMBULATORY_CARE_PROVIDER_SITE_OTHER): Payer: Self-pay | Admitting: Gastroenterology

## 2020-05-21 DIAGNOSIS — Z86010 Personal history of colonic polyps: Secondary | ICD-10-CM

## 2020-05-21 MED ORDER — PEG 3350-KCL-NA BICARB-NACL 420 G PO SOLR: 4000.0000 mL | Freq: Once | ORAL | 0 refills | Status: AC

## 2020-05-21 NOTE — Progress Notes (Signed)
Gastroenterology Pre-Procedure Review  Request Date: Tuesday 06/02/20 Requesting Physician: Dr. Allen Norris  PATIENT REVIEW QUESTIONS: The patient responded to the following health history questions as indicated:    1. Are you having any GI issues? no 2. Do you have a personal history of Polyps? yes (Colon polyps noted on 05/15/15 colonoscopy report performed by Dr. Allen Norris) 3. Do you have a family history of Colon Cancer or Polyps? no 4. Diabetes Mellitus? no 5. Joint replacements in the past 12 months?no 6. Major health problems in the past 3 months?no 7. Any artificial heart valves, MVP, or defibrillator?no    MEDICATIONS & ALLERGIES:    Patient reports the following regarding taking any anticoagulation/antiplatelet therapy:   Plavix, Coumadin, Eliquis, Xarelto, Lovenox, Pradaxa, Brilinta, or Effient? no Aspirin? yes (81 mg daily)  Patient confirms/reports the following medications:  Current Outpatient Medications  Medication Sig Dispense Refill  . aspirin EC 81 MG tablet Take 1 tablet (81 mg total) by mouth daily. 90 tablet 3  . [START ON 06/22/2020] atorvastatin (LIPITOR) 20 MG tablet Take 1 tablet (20 mg total) by mouth at bedtime. 90 tablet 3  . clonazePAM (KLONOPIN) 0.5 MG tablet Take 0.5-1 tablets (0.25-0.5 mg total) by mouth 2 (two) times daily as needed for anxiety. Never take within six hours of alcohol 15 tablet 0  . Fish Oil-Cholecalciferol (FISH OIL + D3) 1000-1000 MG-UNIT CAPS Take by mouth daily.     . Multiple Minerals-Vitamins (CALCIUM & VIT D3 BONE HEALTH PO) Take 2 tablets by mouth daily. Calcium 600mg - Vitamin D 400mg     . psyllium (REGULOID) 0.52 G capsule Take 0.52 g by mouth daily.    . traZODone (DESYREL) 50 MG tablet Take 1 tablet (50 mg total) by mouth at bedtime as needed for sleep. 90 tablet 3   No current facility-administered medications for this visit.    Patient confirms/reports the following allergies:  No Known Allergies  No orders of the defined types  were placed in this encounter.   AUTHORIZATION INFORMATION Primary Insurance: 1D#: Group #:  Secondary Insurance: 1D#: Group #:  SCHEDULE INFORMATION: Date: Tuesday 06/02/20 Time: Location:[facility]

## 2020-05-29 ENCOUNTER — Other Ambulatory Visit: Payer: Self-pay

## 2020-05-29 ENCOUNTER — Other Ambulatory Visit
Admission: RE | Admit: 2020-05-29 | Discharge: 2020-05-29 | Disposition: A | Discharge status: Home / Self Care | Payer: BC Managed Care – PPO | Source: Ambulatory Visit | Attending: Gastroenterology | Admitting: Gastroenterology

## 2020-05-29 DIAGNOSIS — Z20822 Contact with and (suspected) exposure to covid-19: Secondary | ICD-10-CM | POA: Insufficient documentation

## 2020-05-29 DIAGNOSIS — Z01812 Encounter for preprocedural laboratory examination: Secondary | ICD-10-CM | POA: Insufficient documentation

## 2020-05-30 LAB — SARS CORONAVIRUS 2 (TAT 6-24 HRS): SARS Coronavirus 2: NEGATIVE

## 2020-06-01 ENCOUNTER — Encounter: Payer: Self-pay | Admitting: Gastroenterology

## 2020-06-02 ENCOUNTER — Other Ambulatory Visit: Payer: Self-pay

## 2020-06-02 ENCOUNTER — Ambulatory Visit: Payer: BC Managed Care – PPO | Admitting: Anesthesiology

## 2020-06-02 ENCOUNTER — Encounter
Admission: RE | Disposition: A | Discharge status: Home / Self Care | Payer: Self-pay | Source: Home / Self Care | Attending: Gastroenterology

## 2020-06-02 ENCOUNTER — Ambulatory Visit
Admission: RE | Admit: 2020-06-02 | Discharge: 2020-06-02 | Disposition: A | Discharge status: Home / Self Care | Payer: BC Managed Care – PPO | Attending: Gastroenterology | Admitting: Gastroenterology

## 2020-06-02 ENCOUNTER — Encounter: Payer: Self-pay | Admitting: Gastroenterology

## 2020-06-02 DIAGNOSIS — Z86010 Personal history of colonic polyps: Secondary | ICD-10-CM | POA: Diagnosis not present

## 2020-06-02 DIAGNOSIS — Z1211 Encounter for screening for malignant neoplasm of colon: Secondary | ICD-10-CM | POA: Insufficient documentation

## 2020-06-02 DIAGNOSIS — Z7982 Long term (current) use of aspirin: Secondary | ICD-10-CM | POA: Diagnosis not present

## 2020-06-02 DIAGNOSIS — F419 Anxiety disorder, unspecified: Secondary | ICD-10-CM | POA: Insufficient documentation

## 2020-06-02 DIAGNOSIS — M419 Scoliosis, unspecified: Secondary | ICD-10-CM | POA: Diagnosis not present

## 2020-06-02 DIAGNOSIS — K648 Other hemorrhoids: Secondary | ICD-10-CM | POA: Insufficient documentation

## 2020-06-02 DIAGNOSIS — Z8249 Family history of ischemic heart disease and other diseases of the circulatory system: Secondary | ICD-10-CM | POA: Insufficient documentation

## 2020-06-02 DIAGNOSIS — G4730 Sleep apnea, unspecified: Secondary | ICD-10-CM | POA: Diagnosis not present

## 2020-06-02 DIAGNOSIS — M1990 Unspecified osteoarthritis, unspecified site: Secondary | ICD-10-CM | POA: Diagnosis not present

## 2020-06-02 DIAGNOSIS — Z833 Family history of diabetes mellitus: Secondary | ICD-10-CM | POA: Diagnosis not present

## 2020-06-02 DIAGNOSIS — Z79899 Other long term (current) drug therapy: Secondary | ICD-10-CM | POA: Insufficient documentation

## 2020-06-02 DIAGNOSIS — F17210 Nicotine dependence, cigarettes, uncomplicated: Secondary | ICD-10-CM | POA: Insufficient documentation

## 2020-06-02 DIAGNOSIS — E785 Hyperlipidemia, unspecified: Secondary | ICD-10-CM | POA: Diagnosis not present

## 2020-06-02 DIAGNOSIS — F32A Depression, unspecified: Secondary | ICD-10-CM | POA: Diagnosis not present

## 2020-06-02 HISTORY — PX: COLONOSCOPY WITH PROPOFOL: SHX5780

## 2020-06-02 SURGERY — COLONOSCOPY WITH PROPOFOL
Anesthesia: General

## 2020-06-02 MED ORDER — PROPOFOL 500 MG/50ML IV EMUL
INTRAVENOUS | Status: DC | PRN
  Administered 2020-06-02: 140 ug/kg/min via INTRAVENOUS

## 2020-06-02 MED ORDER — PROPOFOL 10 MG/ML IV BOLUS
INTRAVENOUS | Status: DC | PRN
  Administered 2020-06-02: 70 mg via INTRAVENOUS

## 2020-06-02 MED ORDER — SODIUM CHLORIDE 0.9 % IV SOLN: INTRAVENOUS | Status: DC

## 2020-06-02 NOTE — H&P (Signed)
Lucilla Lame, MD [address] [place], [place] [address] [phone] Fax : [phone]  Primary Care Physician:  Delsa Grana, PA-C Primary Gastroenterologist:  Dr. Allen Norris  Pre-Procedure History & Physical: HPI:  Kathy Gregory is a 69 y.o. female is here for an colonoscopy.   Past Medical History:  Diagnosis Date  . Anxiety   . Arthritis    hips, collarbone  . Depression   . Heart murmur 07/24/2016  . Hyperlipidemia   . Migraines    4-5 per year  . Other fatigue 12/29/2017  . Scoliosis    mild  . Sleep apnea   . Vertigo    2-3x/yr    Past Surgical History:  Procedure Laterality Date  . BREAST CYST ASPIRATION Left   . COLONOSCOPY  2006  . COLONOSCOPY WITH PROPOFOL N/A 05/15/2015   Procedure: COLONOSCOPY WITH PROPOFOL;  Surgeon: Lucilla Lame, MD;  Location: [facility];  Service: Endoscopy;  Laterality: N/A;  . POLYPECTOMY  05/15/2015   Procedure: POLYPECTOMY;  Surgeon: Lucilla Lame, MD;  Location: [facility];  Service: Endoscopy;;    Prior to Admission medications   Medication Sig Start Date End Date Taking? Authorizing Provider  aspirin EC 81 MG tablet Take 1 tablet (81 mg total) by mouth daily. 07/26/16   End, Harrell Gave, MD  atorvastatin (LIPITOR) 20 MG tablet Take 1 tablet (20 mg total) by mouth at bedtime. 06/22/20   Delsa Grana, PA-C  clonazePAM (KLONOPIN) 0.5 MG tablet Take 0.5-1 tablets (0.25-0.5 mg total) by mouth 2 (two) times daily as needed for anxiety. Never take within six hours of alcohol 05/06/20   Delsa Grana, PA-C  Fish Oil-Cholecalciferol (FISH OIL + D3) 1000-1000 MG-UNIT CAPS Take by mouth daily.     [provider]  Multiple Minerals-Vitamins (CALCIUM & VIT D3 BONE HEALTH PO) Take 2 tablets by mouth daily. Calcium 600mg - Vitamin D 400mg     [provider]  psyllium (REGULOID) 0.52 G capsule Take 0.52 g by mouth daily.    [provider]  traZODone (DESYREL) 50 MG tablet Take 1 tablet (50  mg total) by mouth at bedtime as needed for sleep. 10/31/19   Delsa Grana, PA-C    Allergies as of 05/21/2020  . (No Known Allergies)    Family History  Problem Relation Age of Onset  . Hypertension Mother   . Diabetes Mother   . Heart disease Father 57       CABG  . Stroke Father 26       Occurred during CABG  . Breast cancer Paternal Grandmother   . Diabetes Brother   . Hypertension Brother   . Kidney disease Maternal Grandmother   . Cirrhosis Brother     Social History   Socioeconomic History  . Marital status: Widowed    Spouse name: Not on file  . Number of children: Not on file  . Years of education: Not on file  . Highest education level: Not on file  Occupational History  . Not on file  Tobacco Use  . Smoking status: Current Every Day Smoker    Packs/day: 0.50    Years: 47.00    Pack years: 23.50    Types: Cigarettes  . Smokeless tobacco: Never Used  Vaping Use  . Vaping Use: Never used  Substance and Sexual Activity  . Alcohol use: Yes    Alcohol/week: 1.0 standard drink    Types: 1 Glasses of wine per week    Comment: occ  .  Drug use: No  . Sexual activity: Never  Other Topics Concern  . Not on file  Social History Narrative  . Not on file   Social Determinants of Health   Financial Resource Strain: Low Risk   . Difficulty of Paying Living Expenses: Not hard at all  Food Insecurity: No Food Insecurity  . Worried About Charity fundraiser in the Last Year: Never true  . Ran Out of Food in the Last Year: Never true  Transportation Needs: No Transportation Needs  . Lack of Transportation (Medical): No  . Lack of Transportation (Non-Medical): No  Physical Activity: Insufficiently Active  . Days of Exercise per Week: 3 days  . Minutes of Exercise per Session: 10 min  Stress: No Stress Concern Present  . Feeling of Stress : Only a little  Social Connections: Socially Isolated  . Frequency of Communication with Friends and Family: More than  three times a week  . Frequency of Social Gatherings with Friends and Family: Never  . Attends Religious Services: Never  . Active Member of Clubs or Organizations: No  . Attends Archivist Meetings: Never  . Marital Status: Widowed  Intimate Partner Violence: Not At Risk  . Fear of Current or Ex-Partner: No  . Emotionally Abused: No  . Physically Abused: No  . Sexually Abused: No    Review of Systems: See HPI, otherwise negative ROS  Physical Exam: BP (!) 144/61   Pulse 70   Temp (!) 96.8 F (36 C) (Tympanic)   Ht 5\' 8"  (1.727 m)   Wt 97.5 kg   SpO2 98%   BMI 32.69 kg/m  General:   Alert,  pleasant and cooperative in NAD Head:  Normocephalic and atraumatic. Neck:  Supple; no masses or thyromegaly. Lungs:  Clear throughout to auscultation.    Heart:  Regular rate and rhythm. Abdomen:  Soft, nontender and nondistended. Normal bowel sounds, without guarding, and without rebound.   Neurologic:  Alert and  oriented x4;  grossly normal neurologically.  Impression/Plan: Kathy Gregory is here for an colonoscopy to be performed for a history of adenomatous polyps on 04/2015   Risks, benefits, limitations, and alternatives regarding  colonoscopy have been reviewed with the patient.  Questions have been answered.  All parties agreeable.   Lucilla Lame, MD  06/02/2020, 9:24 AM

## 2020-06-02 NOTE — Transfer of Care (Signed)
Immediate Anesthesia Transfer of Care Note  Patient: Kathy Gregory  Procedure(s) Performed: COLONOSCOPY WITH PROPOFOL (N/A )  Patient Location: PACU  Anesthesia Type:General  Level of Consciousness: awake, alert  and oriented  Airway & Oxygen Therapy: Patient Spontanous Breathing  Post-op Assessment: Report given to RN and Post -op Vital signs reviewed and stable  Post vital signs: Reviewed and stable  Last Vitals:  Vitals Value Taken Time  BP 103/43 06/02/20 1034  Temp 36.1 C 06/02/20 1032  Pulse 82 06/02/20 1035  Resp 21 06/02/20 1035  SpO2 93 % 06/02/20 1035  Vitals shown include unvalidated device data.  Last Pain:  Vitals:   06/02/20 1032  TempSrc:   PainSc: 0-No pain         Complications: No complications documented.

## 2020-06-02 NOTE — Op Note (Addendum)
[facility] Gastroenterology Patient Name: Kathy Gregory Procedure Date: 06/02/2020 10:13 AM MRN: [id number] Account #: [id number] Date of Birth: 1950/09/13 Admit Type: Outpatient Age: 69 Room: [facility] ENDO ROOM 4 Gender: Female Note Status: Finalized Procedure:             Colonoscopy Indications:           High risk colon cancer surveillance: Personal history                         of colonic polyps Providers:             Lucilla Lame MD, MD Referring MD:          Delsa Grana (Referring MD) Medicines:             Propofol per Anesthesia Complications:         No immediate complications. Procedure:             Pre-Anesthesia Assessment:                        - Prior to the procedure, a History and Physical was                         performed, and patient medications and allergies were                         reviewed. The patient's tolerance of previous                         anesthesia was also reviewed. The risks and benefits                         of the procedure and the sedation options and risks                         were discussed with the patient. All questions were                         answered, and informed consent was obtained. Prior                         Anticoagulants: The patient has taken no previous                         anticoagulant or antiplatelet agents. ASA Grade                         Assessment: II - A patient with mild systemic disease.                         After reviewing the risks and benefits, the patient                         was deemed in satisfactory condition to undergo the                         procedure.  After obtaining informed consent, the colonoscope was                         passed under direct vision. Throughout the procedure,                         the patient's blood pressure, pulse, and oxygen                         saturations were monitored continuously. The                          Colonoscope was introduced through the anus and                         advanced to the the cecum, identified by appendiceal                         orifice and ileocecal valve. The colonoscopy was                         performed without difficulty. The patient tolerated                         the procedure well. The quality of the bowel                         preparation was excellent. Findings:      The perianal and digital rectal examinations were normal.      Non-bleeding internal hemorrhoids were found during retroflexion. The       hemorrhoids were Grade I (internal hemorrhoids that do not prolapse). Impression:            - Non-bleeding internal hemorrhoids.                        - No specimens collected. Recommendation:        - Discharge patient to home.                        - Resume previous diet.                        - Continue present medications. Procedure Code(s):     --- Professional ---                        [phone], Colonoscopy, flexible; diagnostic, including                         collection of specimen(s) by brushing or washing, when                         performed (separate procedure) Diagnosis Code(s):     --- Professional ---                        Z86.010, Personal history of colonic polyps CPT copyright 2019 American Medical Association. All rights reserved. The codes documented in this report are preliminary and upon coder review may  be revised to meet current compliance requirements. Kinnley Paulson  Renatta Shrieves MD, MD 06/02/2020 10:29:41 AM This report has been signed electronically. Number of Addenda: 0 Note Initiated On: 06/02/2020 10:13 AM Scope Withdrawal Time: 0 hours 7 minutes 45 seconds  Total Procedure Duration: 0 hours 10 minutes 3 seconds  Estimated Blood Loss:  Estimated blood loss: none.      [facility]

## 2020-06-02 NOTE — Anesthesia Postprocedure Evaluation (Signed)
Anesthesia Post Note  Patient: Kathy Gregory  Procedure(s) Performed: COLONOSCOPY WITH PROPOFOL (N/A )  Patient location during evaluation: Endoscopy Anesthesia Type: General Level of consciousness: awake and alert Pain management: pain level controlled Vital Signs Assessment: post-procedure vital signs reviewed and stable Respiratory status: spontaneous breathing, nonlabored ventilation, respiratory function stable and patient connected to nasal cannula oxygen Cardiovascular status: blood pressure returned to baseline and stable Postop Assessment: no apparent nausea or vomiting Anesthetic complications: no   No complications documented.   Last Vitals:  Vitals:   06/02/20 1052 06/02/20 1054  BP: 125/64 125/64  Pulse: 64 66  Resp: 20 (!) 22  Temp:    SpO2: 98% 100%    Last Pain:  Vitals:   06/02/20 1054  TempSrc:   PainSc: 0-No pain                 Arita Miss

## 2020-06-02 NOTE — Anesthesia Preprocedure Evaluation (Signed)
Anesthesia Evaluation  Patient identified by MRN, date of birth, ID band Patient awake    Reviewed: Allergy & Precautions, H&P , NPO status , Patient's Chart, lab work & pertinent test results  History of Anesthesia Complications Negative for: history of anesthetic complications  Airway Mallampati: I  TM Distance: >3 FB Neck ROM: full    Dental no notable dental hx.    Pulmonary sleep apnea and Continuous Positive Airway Pressure Ventilation , COPD, Current SmokerPatient did not abstain from smoking.,    Pulmonary exam normal breath sounds clear to auscultation       Cardiovascular Exercise Tolerance: Good METS(-) hypertension(-) CAD and (-) Past MI negative cardio ROS Normal cardiovascular exam(-) dysrhythmias  Rhythm:Regular Rate:Normal - Systolic murmurs    Neuro/Psych  Headaches, PSYCHIATRIC DISORDERS Anxiety Depression    GI/Hepatic negative GI ROS, Neg liver ROS, neg GERD  ,  Endo/Other  negative endocrine ROSneg diabetes  Renal/GU negative Renal ROS  negative genitourinary   Musculoskeletal   Abdominal   Peds  Hematology negative hematology ROS (+)   Anesthesia Other Findings Past Medical History: No date: Anxiety No date: Arthritis     Comment:  hips, collarbone No date: Depression 07/24/2016: Heart murmur No date: Hyperlipidemia No date: Migraines     Comment:  4-5 per year 12/29/2017: Other fatigue No date: Scoliosis     Comment:  mild No date: Sleep apnea No date: Vertigo     Comment:  2-3x/yr  Reproductive/Obstetrics                             Anesthesia Physical  Anesthesia Plan  ASA: II  Anesthesia Plan: General   Post-op Pain Management:    Induction: Intravenous  PONV Risk Score and Plan: 2 and Ondansetron, Propofol infusion and TIVA  Airway Management Planned: Nasal Cannula  Additional Equipment: None  Intra-op Plan:   Post-operative Plan:    Informed Consent: I have reviewed the patients History and Physical, chart, labs and discussed the procedure including the risks, benefits and alternatives for the proposed anesthesia with the patient or authorized representative who has indicated his/her understanding and acceptance.     Dental advisory given  Plan Discussed with: CRNA  Anesthesia Plan Comments: (Discussed risks of anesthesia with patient, including possibility of difficulty with spontaneous ventilation under anesthesia necessitating airway intervention, PONV, and rare risks such as cardiac or respiratory or neurological events. Patient understands.)        Anesthesia Quick Evaluation

## 2020-06-03 ENCOUNTER — Other Ambulatory Visit: Payer: Self-pay | Admitting: *Deleted

## 2020-06-03 ENCOUNTER — Encounter: Payer: Self-pay | Admitting: Gastroenterology

## 2020-06-03 DIAGNOSIS — Z87891 Personal history of nicotine dependence: Secondary | ICD-10-CM

## 2020-06-03 DIAGNOSIS — Z122 Encounter for screening for malignant neoplasm of respiratory organs: Secondary | ICD-10-CM

## 2020-06-03 NOTE — Progress Notes (Signed)
Current smoker, 35.75 pack year

## 2020-06-04 DIAGNOSIS — R718 Other abnormality of red blood cells: Secondary | ICD-10-CM | POA: Diagnosis not present

## 2020-06-04 DIAGNOSIS — D72829 Elevated white blood cell count, unspecified: Secondary | ICD-10-CM | POA: Diagnosis not present

## 2020-06-05 LAB — CBC (INCLUDES DIFF/PLT) WITH PATHOLOGIST REVIEW
Absolute Monocytes: 722 cells/uL (ref 200–950)
Basophils Absolute: 91 cells/uL (ref 0–200)
Basophils Relative: 1.2 %
Eosinophils Absolute: 213 cells/uL (ref 15–500)
Eosinophils Relative: 2.8 %
HCT: 45.5 % — ABNORMAL HIGH (ref 35.0–45.0)
Hemoglobin: 14.9 g/dL (ref 11.7–15.5)
Lymphs Abs: 2326 cells/uL (ref 850–3900)
MCH: 32 pg (ref 27.0–33.0)
MCHC: 32.7 g/dL (ref 32.0–36.0)
MCV: 97.6 fL (ref 80.0–100.0)
MPV: 11 fL (ref 7.5–12.5)
Monocytes Relative: 9.5 %
Neutro Abs: 4248 cells/uL (ref 1500–7800)
Neutrophils Relative %: 55.9 %
Platelets: 280 10*3/uL (ref 140–400)
RBC: 4.66 10*6/uL (ref 3.80–5.10)
RDW: 12.2 % (ref 11.0–15.0)
Total Lymphocyte: 30.6 %
WBC: 7.6 10*3/uL (ref 3.8–10.8)

## 2020-06-11 ENCOUNTER — Ambulatory Visit
Admission: RE | Admit: 2020-06-11 | Discharge: 2020-06-11 | Disposition: A | Discharge status: Home / Self Care | Payer: BC Managed Care – PPO | Source: Ambulatory Visit | Attending: Family Medicine | Admitting: Family Medicine

## 2020-06-11 ENCOUNTER — Other Ambulatory Visit: Payer: Self-pay

## 2020-06-11 ENCOUNTER — Ambulatory Visit
Admission: RE | Admit: 2020-06-11 | Discharge: 2020-06-11 | Disposition: A | Discharge status: Home / Self Care | Payer: BC Managed Care – PPO | Source: Ambulatory Visit | Attending: Nurse Practitioner | Admitting: Nurse Practitioner

## 2020-06-11 DIAGNOSIS — Z87891 Personal history of nicotine dependence: Secondary | ICD-10-CM

## 2020-06-11 DIAGNOSIS — Z1231 Encounter for screening mammogram for malignant neoplasm of breast: Secondary | ICD-10-CM | POA: Insufficient documentation

## 2020-06-11 DIAGNOSIS — F17210 Nicotine dependence, cigarettes, uncomplicated: Secondary | ICD-10-CM | POA: Diagnosis not present

## 2020-06-11 DIAGNOSIS — Z122 Encounter for screening for malignant neoplasm of respiratory organs: Secondary | ICD-10-CM | POA: Insufficient documentation

## 2020-06-18 ENCOUNTER — Encounter: Payer: Self-pay | Admitting: *Deleted

## 2020-08-17 DIAGNOSIS — G4733 Obstructive sleep apnea (adult) (pediatric): Secondary | ICD-10-CM | POA: Diagnosis not present

## 2020-11-03 ENCOUNTER — Ambulatory Visit: Payer: BC Managed Care – PPO | Admitting: Family Medicine

## 2020-11-16 DIAGNOSIS — G4733 Obstructive sleep apnea (adult) (pediatric): Secondary | ICD-10-CM | POA: Diagnosis not present

## 2020-12-13 ENCOUNTER — Other Ambulatory Visit: Payer: Self-pay | Admitting: Family Medicine

## 2020-12-13 DIAGNOSIS — G4700 Insomnia, unspecified: Secondary | ICD-10-CM

## 2021-02-15 DIAGNOSIS — G4733 Obstructive sleep apnea (adult) (pediatric): Secondary | ICD-10-CM | POA: Diagnosis not present

## 2021-03-14 ENCOUNTER — Other Ambulatory Visit: Payer: Self-pay | Admitting: Family Medicine

## 2021-03-14 DIAGNOSIS — G4700 Insomnia, unspecified: Secondary | ICD-10-CM

## 2021-05-06 ENCOUNTER — Other Ambulatory Visit: Payer: Self-pay | Admitting: Family Medicine

## 2021-05-11 ENCOUNTER — Ambulatory Visit (INDEPENDENT_AMBULATORY_CARE_PROVIDER_SITE_OTHER): Payer: BC Managed Care – PPO | Admitting: Family Medicine

## 2021-05-11 ENCOUNTER — Other Ambulatory Visit: Payer: Self-pay

## 2021-05-11 ENCOUNTER — Encounter: Payer: Self-pay | Admitting: Family Medicine

## 2021-05-11 VITALS — BP 122/82 | HR 97 | Temp 97.9°F | Resp 16 | Ht 68.0 in | Wt 233.7 lb

## 2021-05-11 DIAGNOSIS — Z0000 Encounter for general adult medical examination without abnormal findings: Secondary | ICD-10-CM | POA: Diagnosis not present

## 2021-05-11 DIAGNOSIS — I2510 Atherosclerotic heart disease of native coronary artery without angina pectoris: Secondary | ICD-10-CM | POA: Diagnosis not present

## 2021-05-11 DIAGNOSIS — Z6835 Body mass index (BMI) 35.0-35.9, adult: Secondary | ICD-10-CM

## 2021-05-11 DIAGNOSIS — Z1231 Encounter for screening mammogram for malignant neoplasm of breast: Secondary | ICD-10-CM | POA: Diagnosis not present

## 2021-05-11 DIAGNOSIS — G4700 Insomnia, unspecified: Secondary | ICD-10-CM | POA: Insufficient documentation

## 2021-05-11 DIAGNOSIS — I700 Atherosclerosis of aorta: Secondary | ICD-10-CM | POA: Diagnosis not present

## 2021-05-11 DIAGNOSIS — Z23 Encounter for immunization: Secondary | ICD-10-CM | POA: Diagnosis not present

## 2021-05-11 DIAGNOSIS — Z780 Asymptomatic menopausal state: Secondary | ICD-10-CM

## 2021-05-11 DIAGNOSIS — F410 Panic disorder [episodic paroxysmal anxiety] without agoraphobia: Secondary | ICD-10-CM

## 2021-05-11 DIAGNOSIS — M419 Scoliosis, unspecified: Secondary | ICD-10-CM

## 2021-05-11 DIAGNOSIS — E785 Hyperlipidemia, unspecified: Secondary | ICD-10-CM | POA: Diagnosis not present

## 2021-05-11 DIAGNOSIS — J432 Centrilobular emphysema: Secondary | ICD-10-CM

## 2021-05-11 DIAGNOSIS — E2839 Other primary ovarian failure: Secondary | ICD-10-CM

## 2021-05-11 DIAGNOSIS — Z716 Tobacco abuse counseling: Secondary | ICD-10-CM

## 2021-05-11 DIAGNOSIS — M85859 Other specified disorders of bone density and structure, unspecified thigh: Secondary | ICD-10-CM

## 2021-05-11 LAB — CBC WITH DIFFERENTIAL/PLATELET
Absolute Monocytes: 813 cells/uL (ref 200–950)
Basophils Absolute: 108 cells/uL (ref 0–200)
Basophils Relative: 1.1 %
Eosinophils Absolute: 167 cells/uL (ref 15–500)
Eosinophils Relative: 1.7 %
HCT: 45.9 % — ABNORMAL HIGH (ref 35.0–45.0)
Hemoglobin: 15.4 g/dL (ref 11.7–15.5)
Lymphs Abs: 1931 cells/uL (ref 850–3900)
MCH: 32.6 pg (ref 27.0–33.0)
MCHC: 33.6 g/dL (ref 32.0–36.0)
MCV: 97 fL (ref 80.0–100.0)
MPV: 11.1 fL (ref 7.5–12.5)
Monocytes Relative: 8.3 %
Neutro Abs: 6782 cells/uL (ref 1500–7800)
Neutrophils Relative %: 69.2 %
Platelets: 279 10*3/uL (ref 140–400)
RBC: 4.73 10*6/uL (ref 3.80–5.10)
RDW: 12 % (ref 11.0–15.0)
Total Lymphocyte: 19.7 %
WBC: 9.8 10*3/uL (ref 3.8–10.8)

## 2021-05-11 LAB — COMPLETE METABOLIC PANEL WITH GFR
AG Ratio: 1.6 (calc) (ref 1.0–2.5)
ALT: 33 U/L — ABNORMAL HIGH (ref 6–29)
AST: 20 U/L (ref 10–35)
Albumin: 4.1 g/dL (ref 3.6–5.1)
Alkaline phosphatase (APISO): 87 U/L (ref 37–153)
BUN: 19 mg/dL (ref 7–25)
CO2: 29 mmol/L (ref 20–32)
Calcium: 9.7 mg/dL (ref 8.6–10.4)
Chloride: 105 mmol/L (ref 98–110)
Creat: 0.92 mg/dL (ref 0.50–1.05)
Globulin: 2.5 g/dL (calc) (ref 1.9–3.7)
Glucose, Bld: 90 mg/dL (ref 65–99)
Potassium: 4.6 mmol/L (ref 3.5–5.3)
Sodium: 141 mmol/L (ref 135–146)
Total Bilirubin: 0.3 mg/dL (ref 0.2–1.2)
Total Protein: 6.6 g/dL (ref 6.1–8.1)
eGFR: 67 mL/min/{1.73_m2} (ref >=60)

## 2021-05-11 LAB — LIPID PANEL
Cholesterol: 141 mg/dL (ref <200)
HDL: 54 mg/dL (ref >=50)
LDL Cholesterol (Calc): 69 mg/dL (calc)
Non-HDL Cholesterol (Calc): 87 mg/dL (calc) (ref <130)
Total CHOL/HDL Ratio: 2.6 (calc) (ref <5.0)
Triglycerides: 98 mg/dL (ref <150)

## 2021-05-11 MED ORDER — TRAZODONE HCL 50 MG PO TABS: 50.0000 mg | ORAL_TABLET | Freq: Every evening | ORAL | 3 refills | Status: DC | PRN

## 2021-05-11 MED ORDER — CLONAZEPAM 0.5 MG PO TABS: 0.2500 mg | ORAL_TABLET | Freq: Every day | ORAL | 0 refills | Status: DC | PRN

## 2021-05-11 NOTE — Patient Instructions (Addendum)
Health Maintenance  Topic Date Due   DEXA scan (bone density measurement)  06/10/2021   Mammogram  06/11/2021   Tetanus Vaccine  02/06/2022   Colon Cancer Screening  06/02/2025   Flu Shot  Completed   COVID-19 Vaccine  Completed   Hepatitis C Screening: USPSTF Recommendation to screen - Ages 18-69 yo.  Completed   Zoster (Shingles) Vaccine  Completed   HPV Vaccine  Aged Out      Preventive Care 20 Years and Older, Female Preventive care refers to lifestyle choices and visits with your health care provider that can promote health and wellness. This includes: A yearly physical exam. This is also called an annual wellness visit. Regular dental and eye exams. Immunizations. Screening for certain conditions. Healthy lifestyle choices, such as: Eating a healthy diet. Getting regular exercise. Not using drugs or products that contain nicotine and tobacco. Limiting alcohol use. What can I expect for my preventive care visit? Physical exam Your health care provider will check your: Height and weight. These may be used to calculate your BMI (body mass index). BMI is a measurement that tells if you are at a healthy weight. Heart rate and blood pressure. Body temperature. Skin for abnormal spots. Counseling Your health care provider may ask you questions about your: Past medical problems. Family's medical history. Alcohol, tobacco, and drug use. Emotional well-being. Home life and relationship well-being. Sexual activity. Diet, exercise, and sleep habits. History of falls. Memory and ability to understand (cognition). Work and work Statistician. Pregnancy and menstrual history. Access to firearms. What immunizations do I need? Vaccines are usually given at various ages, according to a schedule. Your health care provider will recommend vaccines for you based on your age, medical history, and lifestyle or other factors, such as travel or where you work. What tests do I need? Blood  tests Lipid and cholesterol levels. These may be checked every 5 years, or more often depending on your overall health. Hepatitis C test. Hepatitis B test. Screening Lung cancer screening. You may have this screening every year starting at age 76 if you have a 30-pack-year history of smoking and currently smoke or have quit within the past 15 years. Colorectal cancer screening. All adults should have this screening starting at age 2 and continuing until age 29. Your health care provider may recommend screening at age 46 if you are at increased risk. You will have tests every 1-10 years, depending on your results and the type of screening test. Diabetes screening. This is done by checking your blood sugar (glucose) after you have not eaten for a while (fasting). You may have this done every 1-3 years. Mammogram. This may be done every 1-2 years. Talk with your health care provider about how often you should have regular mammograms. Abdominal aortic aneurysm (AAA) screening. You may need this if you are a current or former smoker. BRCA-related cancer screening. This may be done if you have a family history of breast, ovarian, tubal, or peritoneal cancers. Other tests STD (sexually transmitted disease) testing, if you are at risk. Bone density scan. This is done to screen for osteoporosis. You may have this done starting at age 61. Talk with your health care provider about your test results, treatment options, and if necessary, the need for more tests. Follow these instructions at home: Eating and drinking  Eat a diet that includes fresh fruits and vegetables, whole grains, lean protein, and low-fat dairy products. Limit your intake of foods with high amounts of  sugar, saturated fats, and salt. Take vitamin and mineral supplements as recommended by your health care provider. Do not drink alcohol if your health care provider tells you not to drink. If you drink alcohol: Limit how much you  have to 0-1 drink a day. Be aware of how much alcohol is in your drink. In the [place] one drink equals one 12 oz bottle of beer (355 mL), one 5 oz glass of wine (148 mL), or one 1 oz glass of hard liquor (44 mL). Lifestyle Take daily care of your teeth and gums. Brush your teeth every morning and night with fluoride toothpaste. Floss one time each day. Stay active. Exercise for at least 30 minutes 5 or more days each week. Do not use any products that contain nicotine or tobacco, such as cigarettes, e-cigarettes, and chewing tobacco. If you need help quitting, ask your health care provider. Do not use drugs. If you are sexually active, practice safe sex. Use a condom or other form of protection in order to prevent STIs (sexually transmitted infections). Talk with your health care provider about taking a low-dose aspirin or statin. Find healthy ways to cope with stress, such as: Meditation, yoga, or listening to music. Journaling. Talking to a trusted person. Spending time with friends and family. Safety Always wear your seat belt while driving or riding in a vehicle. Do not drive: If you have been drinking alcohol. Do not ride with someone who has been drinking. When you are tired or distracted. While texting. Wear a helmet and other protective equipment during sports activities. If you have firearms in your house, make sure you follow all gun safety procedures. What's next? Visit your health care provider once a year for an annual wellness visit. Ask your health care provider how often you should have your eyes and teeth checked. Stay up to date on all vaccines. This information is not intended to replace advice given to you by your health care provider. Make sure you discuss any questions you have with your health care provider. Document Revised: 10/16/2020 Document Reviewed: 08/02/2018 [employer] Patient Education  2022 [employer].

## 2021-05-11 NOTE — Progress Notes (Signed)
Patient: Kathy Gregory, Female    DOB: 1950/11/05, 70 y.o.   MRN: [id number] Delsa Grana, PA-C Visit Date: 05/11/2021  Today's Provider: Delsa Grana, PA-C   Chief Complaint  Patient presents with   Annual Exam   Subjective:   Annual physical exam:  Kathy Gregory is a 70 y.o. 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 weight management - psychology/food/hunger management Sleep:  no concerns CPAP      SDOH Screenings   Alcohol Screen: Not on file  Depression (PHQ2-9): Low Risk    PHQ-2 Score: 0  Financial Resource Strain: Low Risk    Difficulty of Paying Living Expenses: Not hard at all  Food Insecurity: No Food Insecurity   Worried About Charity fundraiser in the Last Year: Never true   [employer] in the Last Year: Never true  Housing: Not on file  Physical Activity: Inactive   Days of Exercise per Week: 0 days   Minutes of Exercise per Session: 0 min  Social Connections: Socially Isolated   Frequency of Communication with Friends and Family: More than three times a week   Frequency of Social Gatherings with Friends and Family: Once a week   Attends Religious Services: Never   Marine scientist or Organizations: No   Attends Archivist Meetings: Never   Marital Status: Widowed  Stress: No Stress Concern Present   Feeling of Stress : Only a little  Tobacco Use: High Risk   Smoking Tobacco Use: Every Day   Smokeless Tobacco Use: Never  Transportation Needs: No Transportation Needs   Lack of Transportation (Medical): No   Lack of Transportation (Non-Medical): No   HLD - on lipitor Lab Results  Component Value Date   CHOL 136 05/06/2020   HDL 58 05/06/2020   LDLCALC 62 05/06/2020   TRIG 78 05/06/2020   CHOLHDL 2.3 05/06/2020   Insomnia and panic attacks on trazodone at bedtime  Panic attacks occur randomly uses klonopin     USPSTF grade A and B recommendations - reviewed and addressed today  Depression:  Phq 9 completed today by patient, was reviewed by me with patient in the room PHQ score is neg, pt feels good PHQ 2/9 Scores 05/11/2021 05/06/2020 10/31/2019 07/02/2019  PHQ - 2 Score 0 0 0 0  PHQ- 9 Score 0 - 0 0   Depression screen [facility] 2/9 05/11/2021 05/06/2020 10/31/2019 07/02/2019 05/03/2019  Decreased Interest 0 0 0 0 0  Down, Depressed, Hopeless 0 0 0 0 0  PHQ - 2 Score 0 0 0 0 0  Altered sleeping 0 - 0 0 0  Tired, decreased energy 0 - 0 0 0  Change in appetite 0 - 0 0 0  Feeling bad or failure about yourself  0 - 0 0 0  Trouble concentrating 0 - 0 0 0  Moving slowly or fidgety/restless 0 - 0 0 0  Suicidal thoughts 0 - 0 0 0  PHQ-9 Score 0 - 0 0 0  Difficult doing work/chores Not difficult at all - Not difficult at all Not difficult at all Not difficult at all    Alcohol screening: [facility] Office Visit from 05/06/2020 in [facility]  AUDIT-C Score 1       Immunizations and Health Maintenance: Health Maintenance  Topic Date Due   DEXA [facility]  06/10/2021  MAMMOGRAM  06/11/2021   TETANUS/TDAP  02/06/2022   COLONOSCOPY (Pts 45-48yrs Insurance coverage will need to be confirmed)  06/02/2025   INFLUENZA VACCINE  Completed   COVID-19 Vaccine  Completed   Hepatitis C Screening  Completed   Zoster Vaccines- Shingrix  Completed   HPV VACCINES  Aged Out     Hep C Screening: done  STD testing and prevention (HIV/chl/gon/syphilis):  see above, no additional testing desired by pt today  Intimate partner violence:safeb   Sexual History/Pain during Intercourse: Widowed  Menstrual History/LMP/Abnormal Bleeding: none, postmenopausal No LMP recorded. Patient is postmenopausal.  Incontinence Symptoms: denies  Breast cancer:  Last Mammogram: *see HM list above BRCA gene screening: not know - scheduled for annual mammogram  Cervical cancer screening: aged out Pt  denies family hx of cancers - breast, ovarian, uterine, colon:     Osteoporosis:   Discussion on osteoporosis per age, including high calcium and vitamin D supplementation, weight bearing exercises Pt is supplementing with daily calcium/Vit D. Due oct Bone scan/dexa   Skin cancer:  Hx of skin CA -  NO Discussed atypical lesions   Colorectal cancer:   Colonoscopy is UTD   Discussed concerning signs and sx of CRC, pt denies melena, hematochezia, change in BM pattern or caliber    Lung cancer:  current in program last CT reviewed Low Dose CT Chest recommended if Age 86-80 years, 20 pack-year currently smoking OR have quit w/in 15years. Patient does qualify.    Social History   Tobacco Use   Smoking status: Every Day    Packs/day: 0.50    Years: 47.00    Pack years: 23.50    Types: Cigarettes   Smokeless tobacco: Never  Vaping Use   Vaping Use: Never used  Substance Use Topics   Alcohol use: Yes    Alcohol/week: 1.0 standard drink    Types: 1 Glasses of wine per week    Comment: occ   Drug use: No     Flowsheet Row Office Visit from 05/06/2020 in [facility]  AUDIT-C Score 1       Family History  Problem Relation Age of Onset   Hypertension Mother    Diabetes Mother    Heart disease Father 27       CABG   Stroke Father 44       Occurred during CABG   Breast cancer Paternal Grandmother    Diabetes Brother    Hypertension Brother    Kidney disease Maternal Grandmother    Cirrhosis Brother      Blood pressure/Hypertension: BP Readings from Last 3 Encounters:  05/11/21 122/82  06/02/20 (!) 114/59  05/06/20 128/74    Weight/Obesity: Wt Readings from Last 3 Encounters:  05/11/21 233 lb 11.2 oz (106 kg)  06/11/20 215 lb (97.5 kg)  06/02/20 215 lb (97.5 kg)   BMI Readings from Last 3 Encounters:  05/11/21 35.53 kg/m  06/11/20 32.69 kg/m  06/02/20 32.69 kg/m     Lipids:  Lab Results  Component Value Date   CHOL 136 05/06/2020    CHOL 134 10/31/2019   CHOL 140 05/03/2019   Lab Results  Component Value Date   HDL 58 05/06/2020   HDL 52 10/31/2019   HDL 52 05/03/2019   Lab Results  Component Value Date   LDLCALC 62 05/06/2020   LDLCALC 65 10/31/2019   LDLCALC 72 05/03/2019   Lab Results  Component Value Date   TRIG 78 05/06/2020   TRIG  83 10/31/2019   TRIG 75 05/03/2019   Lab Results  Component Value Date   CHOLHDL 2.3 05/06/2020   CHOLHDL 2.6 10/31/2019   CHOLHDL 2.7 05/03/2019   No results found for: LDLDIRECT Based on the results of lipid panel his/her cardiovascular risk factor ( using [facility] )  in the next 10 years is: The 10-year ASCVD risk score (Arnett DK, et al., 2019) is: 11.1%   Values used to calculate the score:     Age: 78 years     Sex: Female     Is Non-Hispanic African American: No     Diabetic: No     Tobacco smoker: Yes     Systolic Blood Pressure: 122 mmHg     Is BP treated: No     HDL Cholesterol: 58 mg/dL     Total Cholesterol: 136 mg/dL  Glucose:  Glucose, Bld  Date Value Ref Range Status  05/06/2020 79 65 - 99 mg/dL Final    Comment:    .            Fasting reference interval .   10/31/2019 79 65 - 99 mg/dL Final    Comment:    .            Fasting reference interval .   05/03/2019 80 65 - 99 mg/dL Final    Comment:    .            Fasting reference interval .     Advanced Care Planning:  A voluntary discussion about advance care planning including the explanation and discussion of advance directives.     Social History       Social History   Socioeconomic History   Marital status: Widowed    Spouse name: Not on file   Number of children: Not on file   Years of education: Not on file   Highest education level: Not on file  Occupational History   Not on file  Tobacco Use   Smoking status: Every Day    Packs/day: 0.50    Years: 47.00    Pack years: 23.50    Types: Cigarettes   Smokeless tobacco: Never  Vaping Use   Vaping Use:  Never used  Substance and Sexual Activity   Alcohol use: Yes    Alcohol/week: 1.0 standard drink    Types: 1 Glasses of wine per week    Comment: occ   Drug use: No   Sexual activity: Not Currently  Other Topics Concern   Not on file  Social History Narrative   Not on file   Social Determinants of Health   Financial Resource Strain: Low Risk    Difficulty of Paying Living Expenses: Not hard at all  Food Insecurity: No Food Insecurity   Worried About Programme researcher, broadcasting/film/video in the Last Year: Never true   Ran Out of Food in the Last Year: Never true  Transportation Needs: No Transportation Needs   Lack of Transportation (Medical): No   Lack of Transportation (Non-Medical): No  Physical Activity: Inactive   Days of Exercise per Week: 0 days   Minutes of Exercise per Session: 0 min  Stress: No Stress Concern Present   Feeling of Stress : Only a little  Social Connections: Socially Isolated   Frequency of Communication with Friends and Family: More than three times a week   Frequency of Social Gatherings with Friends and Family: Once a week   Attends Religious Services: Never  Active Member of Clubs or Organizations: No   Attends Archivist Meetings: Never   Marital Status: Widowed    Family History        Family History  Problem Relation Age of Onset   Hypertension Mother    Diabetes Mother    Heart disease Father 30       CABG   Stroke Father 51       Occurred during CABG   Breast cancer Paternal Grandmother    Diabetes Brother    Hypertension Brother    Kidney disease Maternal Grandmother    Cirrhosis Brother     Patient Active Problem List   Diagnosis Date Noted   Class 2 severe obesity with serious comorbidity and body mass index (BMI) of 35.0 to 35.9 in adult ([place]) 05/11/2021   Insomnia 05/11/2021   OSA on CPAP 10/26/2018   Obesity (BMI 30.0-34.9) 01/18/2017   Panic attacks 01/18/2017   Tobacco abuse 01/18/2017   Elevated hematocrit 07/20/2016    Coronary artery disease 05/23/2016   Aortic atherosclerosis ([place]) 05/23/2016   Centrilobular emphysema ([place]) 05/23/2016   Osteopenia of neck of femur 04/18/2016   History of colonic polyps    Benign neoplasm of sigmoid colon    Personal history of tobacco use, presenting hazards to health 04/13/2015   Hyperlipidemia LDL goal <70 08/07/2014    Past Surgical History:  Procedure Laterality Date   BREAST CYST ASPIRATION Left    COLONOSCOPY  2006   COLONOSCOPY WITH PROPOFOL N/A 05/15/2015   Procedure: COLONOSCOPY WITH PROPOFOL;  Surgeon: Lucilla Lame, MD;  Location: [place];  Service: Endoscopy;  Laterality: N/A;   COLONOSCOPY WITH PROPOFOL N/A 06/02/2020   Procedure: COLONOSCOPY WITH PROPOFOL;  Surgeon: Lucilla Lame, MD;  Location: [facility] ENDOSCOPY;  Service: Endoscopy;  Laterality: N/A;   EYE SURGERY     POLYPECTOMY  05/15/2015   Procedure: POLYPECTOMY;  Surgeon: Lucilla Lame, MD;  Location: [place];  Service: Endoscopy;;     Current Outpatient Medications:    aspirin EC 81 MG tablet, Take 1 tablet (81 mg total) by mouth daily., Disp: 90 tablet, Rfl: 3   atorvastatin (LIPITOR) 20 MG tablet, Take 1 tablet (20 mg total) by mouth at bedtime., Disp: 90 tablet, Rfl: 3   Fish Oil-Cholecalciferol (FISH OIL + D3) 1000-1000 MG-UNIT CAPS, Take by mouth daily. , Disp: , Rfl:    Multiple Minerals-Vitamins (CALCIUM & VIT D3 BONE HEALTH PO), Take 2 tablets by mouth daily. Calcium $RemoveBefore'600mg'dBswhwlwTMuCj$ - Vitamin D $RemoveB'400mg'GZqaVeoz$ , Disp: , Rfl:    psyllium (REGULOID) 0.52 G capsule, Take 0.52 g by mouth daily., Disp: , Rfl:    clonazePAM (KLONOPIN) 0.5 MG tablet, Take 0.5-1 tablets (0.25-0.5 mg total) by mouth daily as needed for anxiety. Never take within six hours of alcohol, Disp: 20 tablet, Rfl: 0   [START ON 06/14/2021] traZODone (DESYREL) 50 MG tablet, Take 1 tablet (50 mg total) by mouth at bedtime as needed for sleep., Disp: 90 tablet, Rfl: 3  No Known Allergies  Patient Care Team: Delsa Grana, PA-C  as PCP - General (Family Medicine) Laverle Hobby, MD as Consulting Physician (Pulmonary Disease) End, Harrell Gave, MD as Consulting Physician (Cardiology)   Chart Review: I personally reviewed active problem list, medication list, allergies, family history, social history, health maintenance, notes from last encounter, lab results, imaging with the patient/caregiver today.   Review of Systems  Constitutional: Negative.  Negative for activity change, appetite change, fatigue and unexpected weight change.  HENT: Negative.  Eyes: Negative.   Respiratory: Negative.  Negative for shortness of breath.   Cardiovascular: Negative.  Negative for chest pain, palpitations and leg swelling.  Gastrointestinal: Negative.  Negative for abdominal pain and blood in stool.  Endocrine: Negative.   Genitourinary: Negative.   Musculoskeletal: Negative.  Negative for arthralgias, gait problem, joint swelling and myalgias.  Skin: Negative.  Negative for pallor and rash.  Allergic/Immunologic: Negative.   Neurological: Negative.  Negative for syncope and weakness.  Hematological: Negative.   Psychiatric/Behavioral: Negative.  Negative for dysphoric mood, self-injury and suicidal ideas. The patient is not nervous/anxious.   All other systems reviewed and are negative.        Objective:   Vitals:  Vitals:   05/11/21 0952  BP: 122/82  Pulse: 97  Resp: 16  Temp: 97.9 F (36.6 C)  SpO2: 97%  Weight: 233 lb 11.2 oz (106 kg)  Height: $Remove'5\' 8"'BuoteUA$  (1.727 m)    Body mass index is 35.53 kg/m.  Physical Exam Vitals and nursing note reviewed.  Constitutional:      General: She is not in acute distress.    Appearance: Normal appearance. She is well-developed. She is obese. She is not ill-appearing, toxic-appearing or diaphoretic.     Interventions: Face mask in place.  HENT:     Head: Normocephalic and atraumatic.     Right Ear: Tympanic membrane, ear canal and external ear normal. There is no  impacted cerumen.     Left Ear: Tympanic membrane, ear canal and external ear normal. There is no impacted cerumen.     Nose: Nose normal. No congestion or rhinorrhea.     Mouth/Throat:     Mouth: Mucous membranes are moist.     Pharynx: Oropharynx is clear. Posterior oropharyngeal erythema present. No oropharyngeal exudate.  Eyes:     General: Lids are normal. No scleral icterus.       Right eye: No discharge.        Left eye: No discharge.     Conjunctiva/sclera: Conjunctivae normal.     Pupils: Pupils are equal, round, and reactive to light.  Neck:     Trachea: Phonation normal. No tracheal deviation.  Cardiovascular:     Rate and Rhythm: Normal rate and regular rhythm.     Pulses: Normal pulses.          Radial pulses are 2+ on the right side and 2+ on the left side.       Posterior tibial pulses are 2+ on the right side and 2+ on the left side.     Heart sounds: Normal heart sounds. No murmur heard.   No friction rub. No gallop.  Pulmonary:     Effort: Pulmonary effort is normal. No respiratory distress.     Breath sounds: No stridor. Rhonchi present. No wheezing or rales.  Chest:     Chest wall: No tenderness.  Abdominal:     General: Bowel sounds are normal. There is no distension.     Palpations: Abdomen is soft.     Tenderness: There is no abdominal tenderness. There is no right CVA tenderness, left CVA tenderness or guarding.     Comments: Obese protuberant abd  Musculoskeletal:     Cervical back: Normal range of motion and neck supple.     Right lower leg: No edema.     Left lower leg: No edema.  Skin:    General: Skin is warm and dry.     Coloration: Skin is not jaundiced  or pale.     Findings: No bruising, lesion or rash.  Neurological:     Mental Status: She is alert. Mental status is at baseline.     Motor: No abnormal muscle tone.     Gait: Gait abnormal (antalgic gait with getting up from chair, otherwise gait became normal when walking down hall).   Psychiatric:        Mood and Affect: Mood normal.        Speech: Speech normal.        Behavior: Behavior normal.      Fall Risk: Fall Risk  05/11/2021 05/06/2020 10/31/2019 07/02/2019 05/03/2019  Falls in the past year? 0 1 0 0 0  Number falls in past yr: 0 0 0 0 0  Injury with Fall? 0 0 0 0 0  Follow up - Falls evaluation completed - - -    Functional Status Survey: Is the patient deaf or have difficulty hearing?: No Does the patient have difficulty seeing, even when wearing glasses/contacts?: No Does the patient have difficulty concentrating, remembering, or making decisions?: No Does the patient have difficulty walking or climbing stairs?: Yes Does the patient have difficulty dressing or bathing?: No Does the patient have difficulty doing errands alone such as visiting a doctor's office or shopping?: No   Assessment & Plan:    CPE completed today  USPSTF grade A and B recommendations reviewed with patient; age-appropriate recommendations, preventive care, screening tests, etc discussed and encouraged; healthy living encouraged; see AVS for patient education given to patient  Discussed importance of 150 minutes of physical activity weekly, AHA exercise recommendations given to pt in AVS/handout  Discussed importance of healthy diet:  eating lean meats and proteins, avoiding trans fats and saturated fats, avoid simple sugars and excessive carbs in diet, eat 6 servings of fruit/vegetables daily and drink plenty of water and avoid sweet beverages.    Recommended pt to do annual eye exam and routine dental exams/cleanings  Depression, alcohol, fall screening completed as documented above and per flowsheets  Advance Care planning information and packet discussed and offered today, encouraged pt to discuss with family members/spouse/partner/friends and complete Advanced directive packet and bring copy to office   Reviewed Health Maintenance: Health Maintenance  Topic Date Due    DEXA SCAN  06/10/2021   MAMMOGRAM  06/11/2021   TETANUS/TDAP  02/06/2022   COLONOSCOPY (Pts 45-57yrs Insurance coverage will need to be confirmed)  06/02/2025   INFLUENZA VACCINE  Completed   COVID-19 Vaccine  Completed   Hepatitis C Screening  Completed   Zoster Vaccines- Shingrix  Completed   HPV VACCINES  Aged Out    Immunizations: Immunization History  Administered Date(s) Administered   Fluad Quad(high Dose 65+) 05/03/2019, 05/06/2020, 05/11/2021   Influenza, High Dose Seasonal PF 04/26/2018   Influenza,inj,Quad PF,6+ Mos 04/18/2016, 04/20/2017   PFIZER(Purple Top)SARS-COV-2 Vaccination 10/11/2019, 11/01/2019, 06/05/2020, 12/14/2020   Pneumococcal Conjugate-13 04/26/2018   Pneumococcal Polysaccharide-23 08/19/2013, 05/03/2019   Tdap 02/07/2012   Zoster Recombinat (Shingrix) 06/15/2018, 08/27/2018   Vaccines:  HPV: n/a Shingrix: done Pneumonia: completed prior series educated and discussed with patient. Flu: done today educated and discussed with patient. COVID:  UTD - discussed potential next booster this winter    ICD-10-CM   1. Annual physical exam  Z00.00 Lipid panel    COMPLETE METABOLIC PANEL WITH GFR    CBC w/Diff/Platelet    2. Aortic atherosclerosis (HCC)  I70.0 Lipid panel    CBC w/Diff/Platelet   noted on  CT scans, no current concerning cardiac sx, encouraged heart healthy diet/lifestyle and d/c smoking, on statin, discussed cardiology referral if sx    3. Hyperlipidemia LDL goal <70  E78.5 Lipid panel    CBC w/Diff/Platelet   tolerating statin, good compliance, no concerning SE or sx, CAD and aortic atherosclerosis, no myalgais, claudication sx, exertional sx, due for labs    4. Coronary artery disease involving native coronary artery of native heart without angina pectoris  Y40.34 COMPLETE METABOLIC PANEL WITH GFR    CBC w/Diff/Platelet   noted on CT scans, no current concerning cardiac sx, encouraged heart healthy diet/lifestyle and d/c smoking, on  statin, monitoring    5. Panic attacks  F41.0 clonazePAM (KLONOPIN) 0.5 MG tablet   well controlled with infrequent use of klonopin, slightly worse since COVID, verified meds, gave refill, discussed therapy if any worsening    6. Encounter for screening mammogram for malignant neoplasm of breast  Z12.31 MM 3D SCREEN BREAST BILATERAL   scheduled    7. Postmenopausal estrogen deficiency  Z78.0 DG Bone Density    8. Need for influenza vaccination  Z23 Flu Vaccine QUAD High Dose(Fluad)    9. Insomnia, unspecified type  G47.00 traZODone (DESYREL) 50 MG tablet   Stable with trazodone 50 mg at bedtime    10. Centrilobular emphysema (HCC) Chronic J43.2    some sx, both bronchitis changes and emphysema changes in LDCT lung cancer screening, reviewed last CT results with pt, some chronic cough    11. Osteopenia of neck of femur, unspecified laterality  M85.859    she optimized calcium and Vit D supplement, encouraged walking or adding other weight bearing activity    12. Encounter for smoking cessation counseling  Z71.6   Smoking cessation instruction/counseling given:  counseled patient on the dangers of tobacco use, advised patient to stop smoking, and reviewed strategies to maximize success  Spent more than 5 min today discussing affects of smoking, benefits of quitting, reviewing strategies, reviewing scan results, rhonchi on exam etc with patient today, resources offered  13. Class 2 severe obesity with serious comorbidity and body mass index (BMI) of 35.0 to 35.9 in adult, unspecified obesity type ([place])  E66.01    Z68.60    with associated comorbidities CAD, HLD, COPD, chronic back pain    14. Scoliosis, unspecified scoliosis type, unspecified spinal region  M41.9    intermittent back pain, she sees chiropractor once a month and dose home exercises          Delsa Grana, PA-C 05/11/21 11:06 AM  [facility]

## 2021-05-18 DIAGNOSIS — G4733 Obstructive sleep apnea (adult) (pediatric): Secondary | ICD-10-CM | POA: Diagnosis not present

## 2021-06-10 ENCOUNTER — Other Ambulatory Visit: Payer: Self-pay | Admitting: Family Medicine

## 2021-06-10 DIAGNOSIS — I700 Atherosclerosis of aorta: Secondary | ICD-10-CM

## 2021-06-10 DIAGNOSIS — E785 Hyperlipidemia, unspecified: Secondary | ICD-10-CM

## 2021-06-10 DIAGNOSIS — I2510 Atherosclerotic heart disease of native coronary artery without angina pectoris: Secondary | ICD-10-CM

## 2021-06-10 NOTE — Telephone Encounter (Signed)
Requested Prescriptions  Pending Prescriptions Disp Refills  . atorvastatin (LIPITOR) 20 MG tablet [Pharmacy Med Name: ATORVASTATIN 20 MG TABLET] 90 tablet 3    Sig: TAKE 1 TABLET BY MOUTH EVERYDAY AT BEDTIME     Cardiovascular:  Antilipid - Statins Passed - 06/10/2021  1:24 AM      Passed - Total Cholesterol in normal range and within 360 days    Cholesterol  Date Value Ref Range Status  05/11/2021 141 <200 mg/dL Final         Passed - LDL in normal range and within 360 days    LDL Cholesterol (Calc)  Date Value Ref Range Status  05/11/2021 69 mg/dL (calc) Final    Comment:    Reference range: <100 . Desirable range <100 mg/dL for primary prevention;   <70 mg/dL for patients with CHD or diabetic patients  with > or = 2 CHD risk factors. Marland Kitchen LDL-C is now calculated using the Martin-Hopkins  calculation, which is a validated novel method providing  better accuracy than the Friedewald equation in the  estimation of LDL-C.  Cresenciano Genre et al. Annamaria Helling. 2694;854(62): 2061-2068  (http://education.[facility].com/faq/FAQ164)          Passed - HDL in normal range and within 360 days    HDL  Date Value Ref Range Status  05/11/2021 54 > OR = 50 mg/dL Final         Passed - Triglycerides in normal range and within 360 days    Triglycerides  Date Value Ref Range Status  05/11/2021 98 <150 mg/dL Final         Passed - Patient is not pregnant      Passed - Valid encounter within last 12 months    Recent Outpatient Visits          1 month ago Annual physical exam   [facility] Delsa Grana, PA-C   1 year ago Annual physical exam   [facility] Delsa Grana, PA-C   1 year ago Osteopenia of neck of femur, unspecified laterality   [facility] Delsa Grana, PA-C   1 year ago Osteopenia of neck of femur, unspecified laterality   [facility] Delsa Grana, PA-C   2 years ago Annual physical exam   [facility] Delsa Grana, PA-C      Future Appointments            In 5 months Delsa Grana, PA-C [facility], [facility]

## 2021-06-14 ENCOUNTER — Ambulatory Visit
Admission: RE | Admit: 2021-06-14 | Discharge: 2021-06-14 | Disposition: A | Discharge status: Home / Self Care | Payer: BC Managed Care – PPO | Source: Ambulatory Visit | Attending: Family Medicine | Admitting: Family Medicine

## 2021-06-14 ENCOUNTER — Other Ambulatory Visit: Payer: Self-pay

## 2021-06-14 DIAGNOSIS — Z1231 Encounter for screening mammogram for malignant neoplasm of breast: Secondary | ICD-10-CM | POA: Diagnosis not present

## 2021-06-14 DIAGNOSIS — Z780 Asymptomatic menopausal state: Secondary | ICD-10-CM | POA: Insufficient documentation

## 2021-06-14 DIAGNOSIS — M85852 Other specified disorders of bone density and structure, left thigh: Secondary | ICD-10-CM | POA: Diagnosis not present

## 2021-06-17 DIAGNOSIS — G4733 Obstructive sleep apnea (adult) (pediatric): Secondary | ICD-10-CM | POA: Diagnosis not present

## 2021-07-18 DIAGNOSIS — G4733 Obstructive sleep apnea (adult) (pediatric): Secondary | ICD-10-CM | POA: Diagnosis not present

## 2021-08-16 DIAGNOSIS — G4733 Obstructive sleep apnea (adult) (pediatric): Secondary | ICD-10-CM | POA: Diagnosis not present

## 2021-08-17 DIAGNOSIS — G4733 Obstructive sleep apnea (adult) (pediatric): Secondary | ICD-10-CM | POA: Diagnosis not present

## 2021-09-08 ENCOUNTER — Telehealth: Payer: BC Managed Care – PPO | Admitting: Physician Assistant

## 2021-09-08 DIAGNOSIS — R3989 Other symptoms and signs involving the genitourinary system: Secondary | ICD-10-CM | POA: Diagnosis not present

## 2021-09-08 MED ORDER — CEPHALEXIN 500 MG PO CAPS: 500.0000 mg | ORAL_CAPSULE | Freq: Two times a day (BID) | ORAL | 0 refills | Status: AC

## 2021-09-08 NOTE — Progress Notes (Signed)

## 2021-09-08 NOTE — Progress Notes (Signed)
I have spent 5 minutes in review of e-visit questionnaire, review and updating patient chart, medical decision making and response to patient.   Brode Sculley Cody Dulcemaria Bula, PA-C    

## 2021-09-08 NOTE — Progress Notes (Signed)
Message sent to patient requesting further input regarding current symptoms. Awaiting patient response.  

## 2021-09-16 DIAGNOSIS — G4733 Obstructive sleep apnea (adult) (pediatric): Secondary | ICD-10-CM | POA: Diagnosis not present

## 2021-10-15 ENCOUNTER — Other Ambulatory Visit: Payer: Self-pay

## 2021-10-15 DIAGNOSIS — F17210 Nicotine dependence, cigarettes, uncomplicated: Secondary | ICD-10-CM

## 2021-10-15 DIAGNOSIS — Z87891 Personal history of nicotine dependence: Secondary | ICD-10-CM

## 2021-10-17 DIAGNOSIS — G4733 Obstructive sleep apnea (adult) (pediatric): Secondary | ICD-10-CM | POA: Diagnosis not present

## 2021-11-01 ENCOUNTER — Ambulatory Visit
Admission: RE | Admit: 2021-11-01 | Discharge: 2021-11-01 | Disposition: A | Discharge status: Home / Self Care | Payer: BC Managed Care – PPO | Source: Ambulatory Visit | Attending: Acute Care | Admitting: Acute Care

## 2021-11-01 ENCOUNTER — Other Ambulatory Visit: Payer: Self-pay

## 2021-11-01 DIAGNOSIS — F17210 Nicotine dependence, cigarettes, uncomplicated: Secondary | ICD-10-CM | POA: Insufficient documentation

## 2021-11-01 DIAGNOSIS — Z87891 Personal history of nicotine dependence: Secondary | ICD-10-CM | POA: Diagnosis not present

## 2021-11-03 ENCOUNTER — Other Ambulatory Visit: Payer: Self-pay

## 2021-11-03 DIAGNOSIS — Z87891 Personal history of nicotine dependence: Secondary | ICD-10-CM

## 2021-11-03 DIAGNOSIS — F17210 Nicotine dependence, cigarettes, uncomplicated: Secondary | ICD-10-CM

## 2021-11-09 ENCOUNTER — Ambulatory Visit (INDEPENDENT_AMBULATORY_CARE_PROVIDER_SITE_OTHER): Payer: BC Managed Care – PPO | Admitting: Family Medicine

## 2021-11-09 ENCOUNTER — Encounter: Payer: Self-pay | Admitting: Family Medicine

## 2021-11-09 VITALS — BP 124/76 | HR 99 | Temp 98.3°F | Resp 16 | Ht 66.5 in | Wt 237.1 lb

## 2021-11-09 DIAGNOSIS — E66812 Obesity, class 2: Secondary | ICD-10-CM

## 2021-11-09 DIAGNOSIS — I700 Atherosclerosis of aorta: Secondary | ICD-10-CM | POA: Diagnosis not present

## 2021-11-09 DIAGNOSIS — G4700 Insomnia, unspecified: Secondary | ICD-10-CM

## 2021-11-09 DIAGNOSIS — F17200 Nicotine dependence, unspecified, uncomplicated: Secondary | ICD-10-CM

## 2021-11-09 DIAGNOSIS — R7303 Prediabetes: Secondary | ICD-10-CM | POA: Diagnosis not present

## 2021-11-09 DIAGNOSIS — L989 Disorder of the skin and subcutaneous tissue, unspecified: Secondary | ICD-10-CM

## 2021-11-09 DIAGNOSIS — J432 Centrilobular emphysema: Secondary | ICD-10-CM

## 2021-11-09 DIAGNOSIS — E669 Obesity, unspecified: Secondary | ICD-10-CM

## 2021-11-09 DIAGNOSIS — E785 Hyperlipidemia, unspecified: Secondary | ICD-10-CM | POA: Diagnosis not present

## 2021-11-09 DIAGNOSIS — Z6837 Body mass index (BMI) 37.0-37.9, adult: Secondary | ICD-10-CM

## 2021-11-09 DIAGNOSIS — E66811 Obesity, class 1: Secondary | ICD-10-CM

## 2021-11-09 DIAGNOSIS — I2510 Atherosclerotic heart disease of native coronary artery without angina pectoris: Secondary | ICD-10-CM

## 2021-11-09 NOTE — Progress Notes (Signed)
? ?Name: Kathy Gregory   MRN: [id number]    DOB: February 25, 1951   Date:11/09/2021 ? ?     Progress Note ? ?Chief Complaint  ?Patient presents with  ? Follow-up  ? Hyperlipidemia  ? Insomnia  ? ? ? ?Subjective:  ? ?Kathy Gregory is a 71 y.o. female, presents to clinic for routine f/up ? ?HLD - on statin, tolerating, no se or concerns ?Lab Results  ?Component Value Date  ? CHOL 141 05/11/2021  ? HDL 54 05/11/2021  ? [place] 69 05/11/2021  ? TRIG 98 05/11/2021  ? CHOLHDL 2.6 05/11/2021  ? ?Last labs well controlled ? ?Panic attacks and some mild anxiety - klonopin prn, got #20, still has some, hasn't tried other PRN meds ?Depression screen [facility] 2/9 11/09/2021 05/11/2021 05/06/2020  ?Decreased Interest 0 0 0  ?Down, Depressed, Hopeless 0 0 0  ?PHQ - 2 Score 0 0 0  ?Altered sleeping 0 0 -  ?Tired, decreased energy 0 0 -  ?Change in appetite 0 0 -  ?Feeling bad or failure about yourself  0 0 -  ?Trouble concentrating 0 0 -  ?Moving slowly or fidgety/restless 0 0 -  ?Suicidal thoughts 0 0 -  ?PHQ-9 Score 0 0 -  ?Difficult doing work/chores Not difficult at all Not difficult at all -  ? ?GAD 7 : Generalized Anxiety Score 11/09/2021 10/31/2019  ?Nervous, Anxious, on Edge 1 1  ?Control/stop worrying 1 0  ?Worry too much - different things 1 0  ?Trouble relaxing 0 0  ?Restless 0 0  ?Easily annoyed or irritable 0 0  ?Afraid - awful might happen 1 0  ?Total GAD 7 Score 4 1  ?Anxiety Difficulty - Not difficult at all  ? ? ? ? ?Lung cancer screening recently done and reviewed ?Noted unchanged atherosclerotic disease - aortic and CAD and emphysema ?Previously saw Dr. Saunders Revel and did nuclear stress test  ? ?Family hx of DM mother father and brother, hx of prediabetes ?Lab Results  ?Component Value Date  ? HGBA1C 5.8 (H) 05/06/2020  ? ?Obesity- ?Weight reviewed - limited mobility/exercise ? ? ? ? ?Current Outpatient Medications:  ?  aspirin EC 81 MG tablet, Take 1 tablet (81 mg total) by mouth daily., Disp: 90 tablet, Rfl: 3 ?  atorvastatin  (LIPITOR) 20 MG tablet, TAKE 1 TABLET BY MOUTH EVERYDAY AT BEDTIME, Disp: 90 tablet, Rfl: 3 ?  clonazePAM (KLONOPIN) 0.5 MG tablet, Take 0.5-1 tablets (0.25-0.5 mg total) by mouth daily as needed for anxiety. Never take within six hours of alcohol, Disp: 20 tablet, Rfl: 0 ?  Fish Oil-Cholecalciferol (FISH OIL + D3) 1000-1000 MG-UNIT CAPS, Take by mouth daily. , Disp: , Rfl:  ?  Multiple Minerals-Vitamins (CALCIUM & VIT D3 BONE HEALTH PO), Take 2 tablets by mouth daily. Calcium '600mg'$ - Vitamin D '400mg'$ , Disp: , Rfl:  ?  psyllium (REGULOID) 0.52 G capsule, Take 0.52 g by mouth daily., Disp: , Rfl:  ?  traZODone (DESYREL) 50 MG tablet, Take 1 tablet (50 mg total) by mouth at bedtime as needed for sleep., Disp: 90 tablet, Rfl: 3 ? ?Patient Active Problem List  ? Diagnosis Date Noted  ? Class 2 severe obesity with serious comorbidity and body mass index (BMI) of 35.0 to 35.9 in adult [facility]) 05/11/2021  ? Insomnia 05/11/2021  ? OSA on CPAP 10/26/2018  ? Obesity (BMI 30.0-34.9) 01/18/2017  ? Panic attacks 01/18/2017  ? Tobacco abuse 01/18/2017  ? Elevated hematocrit 07/20/2016  ? Coronary artery disease  05/23/2016  ? Aortic atherosclerosis ([place]) 05/23/2016  ? Centrilobular emphysema ([place]) 05/23/2016  ? Osteopenia of neck of femur 04/18/2016  ? History of colonic polyps   ? Benign neoplasm of sigmoid colon   ? Personal history of tobacco use, presenting hazards to health 04/13/2015  ? Hyperlipidemia LDL goal <70 08/07/2014  ? ? ?Past Surgical History:  ?Procedure Laterality Date  ? BREAST CYST ASPIRATION Left   ? COLONOSCOPY  2006  ? COLONOSCOPY WITH PROPOFOL N/A 05/15/2015  ? Procedure: COLONOSCOPY WITH PROPOFOL;  Surgeon: Lucilla Lame, MD;  Location: [place];  Service: Endoscopy;  Laterality: N/A;  ? COLONOSCOPY WITH PROPOFOL N/A 06/02/2020  ? Procedure: COLONOSCOPY WITH PROPOFOL;  Surgeon: Lucilla Lame, MD;  Location: [facility] ENDOSCOPY;  Service: Endoscopy;  Laterality: N/A;  ? EYE SURGERY    ? POLYPECTOMY  05/15/2015   ? Procedure: POLYPECTOMY;  Surgeon: Lucilla Lame, MD;  Location: [place];  Service: Endoscopy;;  ? ? ?Family History  ?Problem Relation Age of Onset  ? Hypertension Mother   ? Diabetes Mother   ? Heart disease Father 54  ?     CABG  ? Stroke Father 49  ?     Occurred during CABG  ? Breast cancer Paternal Grandmother   ? Diabetes Brother   ? Hypertension Brother   ? Kidney disease Maternal Grandmother   ? Cirrhosis Brother   ? ? ?Social History  ? ?Tobacco Use  ? Smoking status: Every Day  ?  Packs/day: 0.50  ?  Years: 47.00  ?  Pack years: 23.50  ?  Types: Cigarettes  ? Smokeless tobacco: Never  ?Vaping Use  ? Vaping Use: Never used  ?Substance Use Topics  ? Alcohol use: Yes  ?  Alcohol/week: 1.0 standard drink  ?  Types: 1 Glasses of wine per week  ?  Comment: occ  ? Drug use: No  ?  ? ?No Known Allergies ? ?Health Maintenance  ?Topic Date Due  ? TETANUS/TDAP  02/06/2022  ? MAMMOGRAM  06/14/2022  ? DEXA SCAN  06/15/2023  ? COLONOSCOPY (Pts 45-29yr Insurance coverage will need to be confirmed)  06/02/2025  ? Pneumonia Vaccine 70 Years old  Completed  ? INFLUENZA VACCINE  Completed  ? COVID-19 Vaccine  Completed  ? Hepatitis C Screening  Completed  ? Zoster Vaccines- Shingrix  Completed  ? HPV VACCINES  Aged Out  ? ? ?Chart Review Today: ?I personally reviewed active problem list, medication list, allergies, family history, social history, health maintenance, notes from last encounter, lab results, imaging with the patient/caregiver today. ? ? ?Review of Systems  ?Constitutional: Negative.   ?HENT: Negative.    ?Eyes: Negative.   ?Respiratory: Negative.    ?Cardiovascular: Negative.   ?Gastrointestinal: Negative.   ?Endocrine: Negative.   ?Genitourinary: Negative.   ?Musculoskeletal: Negative.   ?Skin: Negative.   ?Allergic/Immunologic: Negative.   ?Neurological: Negative.   ?Hematological: Negative.   ?Psychiatric/Behavioral: Negative.    ?All other systems reviewed and are negative.  ? ?Objective:   ? ?Vitals:  ? 11/09/21 0915  ?BP: 124/76  ?Pulse: 99  ?Resp: 16  ?Temp: 98.3 ?F (36.8 ?C)  ?TempSrc: Oral  ?SpO2: 96%  ?Weight: 237 lb 1.6 oz (107.5 kg)  ?Height: 5' 6.5" (1.689 m)  ? ? ?Body mass index is 37.7 kg/m?. ? ?Physical Exam ?Vitals and nursing note reviewed.  ?Constitutional:   ?   General: She is not in acute distress. ?   Appearance: Normal appearance. She  is well-developed and well-groomed. She is obese. She is not ill-appearing, toxic-appearing or diaphoretic.  ?   Interventions: Face mask in place.  ?HENT:  ?   Head: Normocephalic and atraumatic.  ?   Right Ear: External ear normal.  ?   Left Ear: External ear normal.  ?Eyes:  ?   General: Lids are normal. No scleral icterus.    ?   Right eye: No discharge.     ?   Left eye: No discharge.  ?   Conjunctiva/sclera: Conjunctivae normal.  ?Neck:  ?   Trachea: Phonation normal. No tracheal deviation.  ?Cardiovascular:  ?   Rate and Rhythm: Normal rate and regular rhythm.  ?   Pulses: Normal pulses.     ?     Radial pulses are 2+ on the right side and 2+ on the left side.  ?     Posterior tibial pulses are 2+ on the right side and 2+ on the left side.  ?   Heart sounds: Normal heart sounds. No murmur heard. ?  No friction rub. No gallop.  ?Pulmonary:  ?   Effort: Pulmonary effort is normal. No respiratory distress.  ?   Breath sounds: Normal breath sounds. No stridor. No wheezing, rhonchi or rales.  ?Chest:  ?   Chest wall: No tenderness.  ?Abdominal:  ?   General: Bowel sounds are normal. There is no distension.  ?   Palpations: Abdomen is soft.  ?   Comments: Protuberant abdomen  ?Musculoskeletal:  ?   Right lower leg: No edema.  ?   Left lower leg: No edema.  ?Skin: ?   General: Skin is warm and dry.  ?   Coloration: Skin is not jaundiced or pale.  ?   Findings: Lesion (left cheek lesion - see photo and description below) present. No erythema or rash.  ?Neurological:  ?   Mental Status: She is alert. Mental status is at baseline.  ?   Motor: No  abnormal muscle tone.  ?   Gait: Gait abnormal.  ?Psychiatric:     ?   Mood and Affect: Mood normal.     ?   Speech: Speech normal.     ?   Behavior: Behavior normal. Behavior is cooperative.  ?  ? ?Left cheek les

## 2021-11-09 NOTE — Patient Instructions (Signed)
We can try buspar or hydroxyzine for anxiety/panic symptoms if you would like to. ? ?

## 2021-11-10 LAB — COMPLETE METABOLIC PANEL WITH GFR
AG Ratio: 1.6 (calc) (ref 1.0–2.5)
ALT: 43 U/L — ABNORMAL HIGH (ref 6–29)
AST: 24 U/L (ref 10–35)
Albumin: 4.1 g/dL (ref 3.6–5.1)
Alkaline phosphatase (APISO): 99 U/L (ref 37–153)
BUN: 16 mg/dL (ref 7–25)
CO2: 27 mmol/L (ref 20–32)
Calcium: 10.1 mg/dL (ref 8.6–10.4)
Chloride: 106 mmol/L (ref 98–110)
Creat: 0.86 mg/dL (ref 0.50–1.05)
Globulin: 2.6 g/dL (calc) (ref 1.9–3.7)
Glucose, Bld: 109 mg/dL — ABNORMAL HIGH (ref 65–99)
Potassium: 4.4 mmol/L (ref 3.5–5.3)
Sodium: 144 mmol/L (ref 135–146)
Total Bilirubin: 0.4 mg/dL (ref 0.2–1.2)
Total Protein: 6.7 g/dL (ref 6.1–8.1)
eGFR: 73 mL/min/{1.73_m2} (ref >=60)

## 2021-11-10 LAB — HEMOGLOBIN A1C
Hgb A1c MFr Bld: 6.7 % of total Hgb — ABNORMAL HIGH (ref <5.7)
Mean Plasma Glucose: 146 mg/dL
eAG (mmol/L): 8.1 mmol/L

## 2021-11-15 ENCOUNTER — Encounter: Payer: Self-pay | Admitting: Family Medicine

## 2021-11-15 ENCOUNTER — Ambulatory Visit (INDEPENDENT_AMBULATORY_CARE_PROVIDER_SITE_OTHER): Payer: BC Managed Care – PPO | Admitting: Family Medicine

## 2021-11-15 VITALS — BP 122/68 | HR 90 | Temp 97.9°F | Resp 16 | Ht 66.5 in | Wt 234.0 lb

## 2021-11-15 DIAGNOSIS — E119 Type 2 diabetes mellitus without complications: Secondary | ICD-10-CM

## 2021-11-15 MED ORDER — BLOOD GLUCOSE MONITOR KIT: PACK | 0 refills | Status: DC

## 2021-11-15 MED ORDER — BLOOD GLUCOSE MONITOR KIT: PACK | 0 refills | Status: AC

## 2021-11-15 NOTE — Progress Notes (Signed)
? ?Name: Kathy Gregory   MRN: [id number]    DOB: 03/07/1951   Date:11/15/2021 ? ?     Progress Note ? ?Chief Complaint  ?Patient presents with  ? lab results  ?  Discuss recent labwork  ? ? ? ?Subjective:  ? ?Kathy Gregory is a 71 y.o. female, presents to clinic for f/up on labs - new onset DM ? ?Last A1C was 5.8, no dietary efforts, obesity, family hx of T2DM, A1C came back at 6.7 ?Here to review today ? ?Also ALT mildly elevated - reviewed ? ? ? ? ?Lab Results  ?Component Value Date  ? HGBA1C 6.7 (H) 11/09/2021  ? ? ? ? ?Current Outpatient Medications:  ?  aspirin EC 81 MG tablet, Take 1 tablet (81 mg total) by mouth daily., Disp: 90 tablet, Rfl: 3 ?  atorvastatin (LIPITOR) 20 MG tablet, TAKE 1 TABLET BY MOUTH EVERYDAY AT BEDTIME, Disp: 90 tablet, Rfl: 3 ?  clonazePAM (KLONOPIN) 0.5 MG tablet, Take 0.5-1 tablets (0.25-0.5 mg total) by mouth daily as needed for anxiety. Never take within six hours of alcohol, Disp: 20 tablet, Rfl: 0 ?  Fish Oil-Cholecalciferol (FISH OIL + D3) 1000-1000 MG-UNIT CAPS, Take by mouth daily. , Disp: , Rfl:  ?  Multiple Minerals-Vitamins (CALCIUM & VIT D3 BONE HEALTH PO), Take 2 tablets by mouth daily. Calcium $RemoveBefore'600mg'upqsNeDyoRLoa$ - Vitamin D $RemoveB'400mg'VPWBBEUf$ , Disp: , Rfl:  ?  psyllium (REGULOID) 0.52 G capsule, Take 0.52 g by mouth daily., Disp: , Rfl:  ?  traZODone (DESYREL) 50 MG tablet, Take 1 tablet (50 mg total) by mouth at bedtime as needed for sleep., Disp: 90 tablet, Rfl: 3 ? ?Patient Active Problem List  ? Diagnosis Date Noted  ? Prediabetes 11/09/2021  ? Lesion of face 11/09/2021  ? Class 2 severe obesity with serious comorbidity and body mass index (BMI) of 35.0 to 35.9 in adult [facility]) 05/11/2021  ? Insomnia 05/11/2021  ? OSA on CPAP 10/26/2018  ? Obesity (BMI 30.0-34.9) 01/18/2017  ? Panic attacks 01/18/2017  ? Current smoker 01/18/2017  ? Elevated hematocrit 07/20/2016  ? Coronary artery disease 05/23/2016  ? Aortic atherosclerosis ([place]) 05/23/2016  ? Centrilobular emphysema ([place]) 05/23/2016  ?  Osteopenia of neck of femur 04/18/2016  ? History of colonic polyps   ? Benign neoplasm of sigmoid colon   ? Personal history of tobacco use, presenting hazards to health 04/13/2015  ? Hyperlipidemia LDL goal <70 08/07/2014  ? ? ?Past Surgical History:  ?Procedure Laterality Date  ? BREAST CYST ASPIRATION Left   ? COLONOSCOPY  2006  ? COLONOSCOPY WITH PROPOFOL N/A 05/15/2015  ? Procedure: COLONOSCOPY WITH PROPOFOL;  Surgeon: Lucilla Lame, MD;  Location: [place];  Service: Endoscopy;  Laterality: N/A;  ? COLONOSCOPY WITH PROPOFOL N/A 06/02/2020  ? Procedure: COLONOSCOPY WITH PROPOFOL;  Surgeon: Lucilla Lame, MD;  Location[facility] ENDOSCOPY;  Service: Endoscopy;  Laterality: N/A;  ? EYE SURGERY    ? POLYPECTOMY  05/15/2015  ? Procedure: POLYPECTOMY;  Surgeon: Lucilla Lame, MD;  Location: [place];  Service: Endoscopy;;  ? ? ?Family History  ?Problem Relation Age of Onset  ? Hypertension Mother   ? Diabetes Mother   ? Heart disease Father 53  ?     CABG  ? Stroke Father 70  ?     Occurred during CABG  ? Breast cancer Paternal Grandmother   ? Diabetes Brother   ? Hypertension Brother   ? Kidney disease Maternal Grandmother   ? Cirrhosis  Brother   ? ? ?Social History  ? ?Tobacco Use  ? Smoking status: Every Day  ?  Packs/day: 0.50  ?  Years: 47.00  ?  Pack years: 23.50  ?  Types: Cigarettes  ? Smokeless tobacco: Never  ?Vaping Use  ? Vaping Use: Never used  ?Substance Use Topics  ? Alcohol use: Yes  ?  Alcohol/week: 1.0 standard drink  ?  Types: 1 Glasses of wine per week  ?  Comment: occ  ? Drug use: No  ?  ? ?No Known Allergies ? ?Health Maintenance  ?Topic Date Due  ? TETANUS/TDAP  02/06/2022  ? MAMMOGRAM  06/14/2022  ? DEXA SCAN  06/15/2023  ? COLONOSCOPY (Pts 45-52yrs Insurance coverage will need to be confirmed)  06/02/2025  ? Pneumonia Vaccine 53+ Years old  Completed  ? INFLUENZA VACCINE  Completed  ? COVID-19 Vaccine  Completed  ? Hepatitis C Screening  Completed  ? Zoster Vaccines- Shingrix   Completed  ? HPV VACCINES  Aged Out  ? ? ?Chart Review Today: ?I personally reviewed active problem list, medication list, allergies, family history, social history, health maintenance, notes from last encounter, lab results, imaging with the patient/caregiver today. ? ? ?Review of Systems  ?Constitutional: Negative.   ?HENT: Negative.    ?Eyes: Negative.   ?Respiratory: Negative.    ?Cardiovascular: Negative.   ?Gastrointestinal: Negative.   ?Endocrine: Negative.   ?Genitourinary: Negative.   ?Musculoskeletal: Negative.   ?Skin: Negative.   ?Allergic/Immunologic: Negative.   ?Neurological: Negative.   ?Hematological: Negative.   ?Psychiatric/Behavioral: Negative.    ?All other systems reviewed and are negative.  ? ?Objective:  ? ?Vitals:  ? 11/15/21 1056  ?BP: 122/68  ?Pulse: 90  ?Resp: 16  ?Temp: 97.9 ?F (36.6 ?C)  ?TempSrc: Oral  ?SpO2: 96%  ?Weight: 234 lb (106.1 kg)  ?Height: 5' 6.5" (1.689 m)  ? ? ?Body mass index is 37.2 kg/m?. ? ?Physical Exam ?Vitals and nursing note reviewed.  ?Constitutional:   ?   General: She is not in acute distress. ?   Appearance: She is obese. She is not ill-appearing, toxic-appearing or diaphoretic.  ?HENT:  ?   Head: Normocephalic and atraumatic.  ?   Right Ear: External ear normal.  ?   Left Ear: External ear normal.  ?Eyes:  ?   General:     ?   Right eye: No discharge.     ?   Left eye: No discharge.  ?   Conjunctiva/sclera: Conjunctivae normal.  ?Cardiovascular:  ?   Rate and Rhythm: Normal rate and regular rhythm.  ?   Pulses: Normal pulses.     ?     Radial pulses are 2+ on the right side and 2+ on the left side.  ?     Dorsalis pedis pulses are 2+ on the right side and 2+ on the left side.  ?     Posterior tibial pulses are 2+ on the right side and 2+ on the left side.  ?   Heart sounds: Normal heart sounds. No murmur heard. ?  No friction rub. No gallop.  ?Pulmonary:  ?   Effort: Pulmonary effort is normal.  ?   Breath sounds: Normal breath sounds.  ?Abdominal:  ?    General: Bowel sounds are normal.  ?   Comments: Protuberant abdomen  ?Skin: ?   General: Skin is warm and dry.  ?Neurological:  ?   Mental Status: She is alert.  ?  Gait: Gait normal.  ?Psychiatric:     ?   Mood and Affect: Mood normal.     ?   Behavior: Behavior normal.  ?  ?Diabetic Foot Exam - Simple   ?Simple Foot Form ?Diabetic Foot exam was performed with the following findings: Yes 11/15/2021 11:40 AM  ?Visual Inspection ?No deformities, no ulcerations, no other skin breakdown bilaterally: Yes ?Sensation Testing ?Intact to touch and monofilament testing bilaterally: Yes ?Pulse Check ?Posterior Tibialis and Dorsalis pulse intact bilaterally: Yes ?Comments ?  ? ? ? ? ? ?Assessment & Plan:  ? ?1. New onset type 2 diabetes mellitus ([place]) ? ?Spoke with pt about new diagnosis.  Discussed A1C results with them and explained what an A1C is, basic pathophysiology of DM Type 2, basic home care, basic diabetes diet nutrition principles, importance of checking CBGs and maintaining good CBG control to prevent long-term and short-term complications.  Reviewed signs and symptoms of hyperglycemia and hypoglycemia and how to treat hypoglycemia at home.  Also reviewed blood sugar goals and A1c goals for home.   ? ?Foot exam done ?Reviewed renal protective meds - for now will screen microalbumin ?She is on statin ?Will work on diet for now and f/up closely with repeated labs ?She will go to [facility] for DM eye exam ? ?- Microalbumin, urine ?- Ambulatory referral to Ophthalmology ?- Referral to Nutrition and Diabetes Services ?- blood glucose meter kit and supplies KIT; Dispense based on patient and insurance preference. Use up to four times daily as directed. (FOR ICD-9 250.00, 250.01).  Dispense: 1 each; Refill: 0 ? ? ?Return for 4 month f/up in office DM recheck .  ? ?Delsa Grana, PA-C ?11/15/21 11:25 AM ? ? ?

## 2021-11-15 NOTE — Patient Instructions (Signed)

## 2021-11-16 DIAGNOSIS — G4733 Obstructive sleep apnea (adult) (pediatric): Secondary | ICD-10-CM | POA: Diagnosis not present

## 2021-11-16 LAB — MICROALBUMIN, URINE: Microalb, Ur: <0.2 mg/dL

## 2021-12-15 ENCOUNTER — Other Ambulatory Visit: Payer: Self-pay | Admitting: Family Medicine

## 2021-12-16 DIAGNOSIS — H40053 Ocular hypertension, bilateral: Secondary | ICD-10-CM | POA: Diagnosis not present

## 2021-12-16 LAB — HM DIABETES EYE EXAM

## 2021-12-17 DIAGNOSIS — G4733 Obstructive sleep apnea (adult) (pediatric): Secondary | ICD-10-CM | POA: Diagnosis not present

## 2021-12-23 ENCOUNTER — Ambulatory Visit (INDEPENDENT_AMBULATORY_CARE_PROVIDER_SITE_OTHER): Payer: BC Managed Care – PPO | Admitting: Internal Medicine

## 2021-12-23 ENCOUNTER — Encounter: Payer: Self-pay | Admitting: Internal Medicine

## 2021-12-23 VITALS — BP 124/80 | HR 96 | Ht 67.5 in | Wt 226.0 lb

## 2021-12-23 DIAGNOSIS — I700 Atherosclerosis of aorta: Secondary | ICD-10-CM

## 2021-12-23 DIAGNOSIS — I2584 Coronary atherosclerosis due to calcified coronary lesion: Secondary | ICD-10-CM

## 2021-12-23 DIAGNOSIS — I2510 Atherosclerotic heart disease of native coronary artery without angina pectoris: Secondary | ICD-10-CM | POA: Diagnosis not present

## 2021-12-23 DIAGNOSIS — R0609 Other forms of dyspnea: Secondary | ICD-10-CM | POA: Diagnosis not present

## 2021-12-23 DIAGNOSIS — R011 Cardiac murmur, unspecified: Secondary | ICD-10-CM

## 2021-12-23 NOTE — Progress Notes (Signed)
? ?Follow-up Outpatient Visit ?Date: 12/23/2021 ? ?Primary Care Provider: ?Delsa Grana, PA-C ?[address] ?[place] [address] ? ?Chief Complaint: Coronary artery calcification ? ?HPI:  Kathy Gregory is a 71 y.o. female with history of coronary artery calcification, hyperlipidemia, obstructive sleep apnea, tobacco use, arthritis, migraine headaches, and depression/anxiety, who presents for follow-up of coronary artery calcification.  We last spoke via virtual visit in 01/2020, at which time she was feeling well other than mild chronic fatigue and occasional palpitations.  We did not make any medication changes or pursue additional testing. ? ?Today, Kathy Gregory reports that she feels fairly well.  She was encouraged to see [place] again by her PCP after lung cancer screening CT again made note of coronary artery calcification.  She has not had any chest pain but notes mild exertional dyspnea when she is very active.  She attributes some of this to being more sedentary on account of her back pain.  She notes rare palpitations described as a "skipped beat."  There are no associated symptoms.  She has not had any lightheadedness or edema. ? ?-------------------------------------------------------------------------------------------------- ? ?Past Medical History:  ?Diagnosis Date  ? Anxiety   ? Arthritis   ? hips, collarbone  ? Coronary artery calcification   ? Depression   ? Diabetes ([place])   ? Heart murmur 07/24/2016  ? Hyperlipidemia   ? Migraines   ? 4-5 per year  ? Other fatigue 12/29/2017  ? Scoliosis   ? mild  ? Sleep apnea   ? Vertigo   ? 2-3x/yr  ? ?Past Surgical History:  ?Procedure Laterality Date  ? BREAST CYST ASPIRATION Left   ? COLONOSCOPY  2006  ? COLONOSCOPY WITH PROPOFOL N/A 05/15/2015  ? Procedure: COLONOSCOPY WITH PROPOFOL;  Surgeon: Lucilla Lame, MD;  Location: [place];  Service: Endoscopy;  Laterality: N/A;  ? COLONOSCOPY WITH PROPOFOL N/A 06/02/2020  ? Procedure: COLONOSCOPY WITH  PROPOFOL;  Surgeon: Lucilla Lame, MD;  [facility] END[facility]COPY;  Service: Endoscopy;  Laterality: N/A;  ? EYE SURGERY    ? POLYPECTOMY  05/15/2015  ? Procedure: POLYPECTOMY;  Surgeon: Lucilla Lame, MD;  Location: [place];  Service: Endoscopy;;  ? ? ? ?Recent CV Pertinent Labs: ?Lab Results  ?Component Value Date  ? CHOL 141 05/11/2021  ? HDL 54 05/11/2021  ? [place] 69 05/11/2021  ? TRIG 98 05/11/2021  ? CHOLHDL 2.6 05/11/2021  ? K 4.4 11/09/2021  ? BUN 16 11/09/2021  ? CREATININE 0.86 11/09/2021  ? ? ?Past medical and surgical history were reviewed and updated in EPIC. ? ?Current Meds  ?Medication Sig  ? aspirin EC 81 MG tablet Take 1 tablet (81 mg total) by mouth daily.  ? atorvastatin (LIPITOR) 20 MG tablet TAKE 1 TABLET BY MOUTH EVERYDAY AT BEDTIME  ? blood glucose meter kit and supplies KIT Dispense based on patient and insurance preference. Use up to four times daily as directed. (FOR ICD-9 250.00, 250.01).  ? clonazePAM (KLONOPIN) 0.5 MG tablet Take 0.5-1 tablets (0.25-0.5 mg total) by mouth daily as needed for anxiety. Never take within six hours of alcohol  ? CONTOUR NEXT TEST test strip USE UP TO 4 TIMES DAILY AS DIRECTED  ? Fish Oil-Cholecalciferol (FISH OIL + D3) 1000-1000 MG-UNIT CAPS Take by mouth daily.   ? Multiple Minerals-Vitamins (CALCIUM & VIT D3 BONE HEALTH PO) Take 2 tablets by mouth daily. Calcium $RemoveBefore'600mg'POIWPAxahETup$ - Vitamin D $RemoveB'400mg'fstwPAQG$   ? psyllium (REGULOID) 0.52 G capsule Take 0.52 g by mouth  daily.  ? traZODone (DESYREL) 50 MG tablet Take 1 tablet (50 mg total) by mouth at bedtime as needed for sleep.  ? ? ?Allergies: Patient has no known allergies. ? ?Social History  ? ?Tobacco Use  ? Smoking status: Every Day  ?  Packs/day: 0.50  ?  Years: 47.00  ?  Pack years: 23.50  ?  Types: Cigarettes  ? Smokeless tobacco: Never  ?Vaping Use  ? Vaping Use: Never used  ?Substance Use Topics  ? Alcohol use: Not Currently  ?  Alcohol/week: 1.0 standard drink  ?  Types: 1 Glasses of wine per week  ?  Comment:  occ  ? Drug use: No  ? ? ?Family History  ?Problem Relation Age of Onset  ? Hypertension Mother   ? Diabetes Mother   ? Heart disease Father 25  ?     CABG  ? Stroke Father 29  ?     Occurred during CABG  ? Diabetes Brother   ? Hypertension Brother   ? Atrial fibrillation Brother   ? Cirrhosis Brother   ? Kidney disease Maternal Grandmother   ? Breast cancer Paternal Grandmother   ? ? ?Review of Systems: ?A 12-system review of systems was performed and was negative except as noted in the HPI. ? ?-------------------------------------------------------------------------------------------------- ? ?Physical Exam: ?BP 124/80 (BP Location: Left Arm, Patient Position: Sitting, Cuff Size: Normal)   Pulse 96   Ht 5' 7.5" (1.715 m)   Wt 226 lb (102.5 kg)   SpO2 98%   BMI 34.87 kg/m?  ? ?General:  NAD. ?Neck: No JVD or HJR. ?Lungs: Clear to auscultation bilaterally without wheezes or crackles. ?Heart: Regular rate and rhythm with 2/6 systolic murmur.  No rubs or gallops. ?Abdomen: Soft, nontender, nondistended. ?Extremities: No lower extremity edema. ? ?EKG: Normal sinus rhythm.  Heart rate has increased since 12/28/2017.  Otherwise, no significant change or significant abnormality. ? ?Lab Results  ?Component Value Date  ? WBC 9.8 05/11/2021  ? HGB 15.4 05/11/2021  ? HCT 45.9 (H) 05/11/2021  ? MCV 97.0 05/11/2021  ? PLT 279 05/11/2021  ? ? ?Lab Results  ?Component Value Date  ? NA 144 11/09/2021  ? K 4.4 11/09/2021  ? CL 106 11/09/2021  ? CO2 27 11/09/2021  ? BUN 16 11/09/2021  ? CREATININE 0.86 11/09/2021  ? GLUCOSE 109 (H) 11/09/2021  ? ALT 43 (H) 11/09/2021  ? ? ?Lab Results  ?Component Value Date  ? CHOL 141 05/11/2021  ? HDL 54 05/11/2021  ? [place] 69 05/11/2021  ? TRIG 98 05/11/2021  ? CHOLHDL 2.6 05/11/2021  ? ? ?-------------------------------------------------------------------------------------------------- ? ?ASSESSMENT AND PLAN: ?Coronary artery calcification and aortic atherosclerosis: ?Calcifications noted  in the coronary arteries and aorta again noted on most recent lung cancer screening CT.  I have compared this scan to her study in 2018 when we first met; there is stable or minimally increased calcification in the aorta, though overall there has not been much change.  Myocardial perfusion stress test in 2019 was low risk without evidence of ischemia or scar.  Given lack of angina, we have agreed to defer additional testing today.  If exertional dyspnea worsens or angina develops, we would need to reconsider this.  We will plan to continue aspirin 81 mg daily and atorvastatin 20 mg daily for target LDL less than 100, ideally below 70. ? ?Dyspnea on exertion and heart murmur: ?Kathy Gregory believes this is primarily due to deconditioning.  Concurrent emphysema and ongoing tobacco  use are likely contributing.  Given history of coronary artery calcification and heart murmur appreciated on exam today, we have agreed to obtain an echocardiogram.  I encouraged Kathy Gregory to increase her activity, as tolerated, and quit smoking. ? ?Follow-up: Return to clinic in 1 year, sooner if echo shows significant abnormality or symptoms develop. ? ?Nelva Bush, MD ?12/23/2021 ?10:52 AM ? ?

## 2021-12-23 NOTE — Patient Instructions (Signed)
Medication Instructions:  ? ?Your physician recommends that you continue on your current medications as directed. Please refer to the Current Medication list given to you today. ? ?*If you need a refill on your cardiac medications before your next appointment, please call your pharmacy* ? ? ?Lab Work: ? ?None ordered ? ?Testing/Procedures: ? ?Your physician has requested that you have an echocardiogram. Echocardiography is a painless test that uses sound waves to create images of your heart. It provides your doctor with information about the size and shape of your heart and how well your heart?s chambers and valves are working. This procedure takes approximately one hour. There are no restrictions for this procedure. ? ? ?Follow-Up: ?At [facility], you and your health needs are our priority.  As part of our continuing mission to provide you with exceptional heart care, we have created designated Provider Care Teams.  These Care Teams include your primary Cardiologist (physician) and Advanced Practice Providers (APPs -  Physician Assistants and Nurse Practitioners) who all work together to provide you with the care you need, when you need it. ? ?We recommend signing up for the patient portal called "MyChart".  Sign up information is provided on this After Visit Summary.  MyChart is used to connect with patients for Virtual Visits (Telemedicine).  Patients are able to view lab/test results, encounter notes, upcoming appointments, etc.  Non-urgent messages can be sent to your provider as well.   ?To learn more about what you can do with MyChart, go to [email or web address].   ? ?Your next appointment:   ?1 year(s) ? ?The format for your next appointment:   ?In Person ? ?Provider:   ?You may see Dr. Harrell Gave End or one of the following Advanced Practice Providers on your designated Care Team:   ?Murray Hodgkins, NP ?Christell Faith, PA-C ?Cadence Kathlen Mody, PA-C{ ? ? ?Important Information About Sugar ? ? ? ? ? ? ?

## 2021-12-24 ENCOUNTER — Other Ambulatory Visit: Payer: Self-pay | Admitting: Family Medicine

## 2021-12-24 NOTE — Telephone Encounter (Signed)
Requested medications are due for refill today.  unsure ? ?Requested medications are on the active medications list.  no ? ?Last refill. none ? ?Future visit scheduled.   yes ? ?Notes to clinic.  Lancets are not on med list. Please advise. ? ? ? ?Requested Prescriptions  ?Pending Prescriptions Disp Refills  ? Microlet Lancets MISC [Pharmacy Med Name: MICROLET LANCETS] 100 each   ?  Sig: USE UP TO 4 TIMES DAILY AS DIRECTED  ?  ? Endocrinology: Diabetes - Testing Supplies Passed - 12/24/2021  2:34 PM  ?  ?  Passed - Valid encounter within last 12 months  ?  Recent Outpatient Visits   ? ?      ? 1 month ago New onset type 2 diabetes mellitus ([place])  ? [facility] Willard, Kristeen Miss, PA-C  ? 1 month ago Hyperlipidemia LDL goal <70  ? [facility] Delsa Grana, PA-C  ? 7 months ago Annual physical exam  ? [facility] Delsa Grana, PA-C  ? 1 year ago Annual physical exam  ? [facility] Delsa Grana, PA-C  ? 2 years ago Osteopenia of neck of femur, unspecified laterality  ? [facility] Delsa Grana, [place]  ? ?  ?  ?Future Appointments   ? ?        ? In 2 months Delsa Grana, PA-C [facility], [facility]  ? In 4 months Delsa Grana, PA-C [facility], [place]  ? ?  ? ? ?  ?  ?  ?  ?

## 2021-12-25 ENCOUNTER — Encounter: Payer: Self-pay | Admitting: Internal Medicine

## 2021-12-25 DIAGNOSIS — R0609 Other forms of dyspnea: Secondary | ICD-10-CM | POA: Insufficient documentation

## 2021-12-25 DIAGNOSIS — R011 Cardiac murmur, unspecified: Secondary | ICD-10-CM | POA: Insufficient documentation

## 2021-12-30 DIAGNOSIS — L821 Other seborrheic keratosis: Secondary | ICD-10-CM | POA: Diagnosis not present

## 2022-01-16 DIAGNOSIS — G4733 Obstructive sleep apnea (adult) (pediatric): Secondary | ICD-10-CM | POA: Diagnosis not present

## 2022-01-20 ENCOUNTER — Ambulatory Visit (INDEPENDENT_AMBULATORY_CARE_PROVIDER_SITE_OTHER): Payer: BC Managed Care – PPO

## 2022-01-20 DIAGNOSIS — R011 Cardiac murmur, unspecified: Secondary | ICD-10-CM | POA: Diagnosis not present

## 2022-01-20 LAB — ECHOCARDIOGRAM COMPLETE
AR max vel: 2.76 cm2
AV Area VTI: 3.03 cm2
AV Area mean vel: 2.59 cm2
AV Mean grad: 2 mmHg
AV Peak grad: 3.8 mmHg
Ao pk vel: 0.97 m/s
Area-P 1/2: 2.26 cm2
Calc EF: 65.4 %
S' Lateral: 3.3 cm
Single Plane A2C EF: 59.8 %
Single Plane A4C EF: 66.3 %

## 2022-01-27 ENCOUNTER — Encounter: Payer: Self-pay | Admitting: Dietician

## 2022-01-27 ENCOUNTER — Encounter: Payer: BC Managed Care – PPO | Attending: Family Medicine | Admitting: Dietician

## 2022-01-27 VITALS — Ht 66.5 in | Wt 219.5 lb

## 2022-01-27 DIAGNOSIS — Z6835 Body mass index (BMI) 35.0-35.9, adult: Secondary | ICD-10-CM

## 2022-01-27 DIAGNOSIS — E119 Type 2 diabetes mellitus without complications: Secondary | ICD-10-CM | POA: Diagnosis not present

## 2022-01-27 DIAGNOSIS — E66812 Obesity, class 2: Secondary | ICD-10-CM

## 2022-01-27 NOTE — Patient Instructions (Addendum)
Great job limiting sugar and carbs! Do allow for about 30-45grams of carb with each meal.  Choose whole grain/ high fiber options when possible. Continue to include some physical activity/ movement as tolerated.

## 2022-01-27 NOTE — Progress Notes (Signed)
Medical Nutrition Therapy: Visit start time: 1630  end time: 1730  Assessment:  Diagnosis: Type 2 diabetes Past medical history: hyperlipidemia, sleep apnea, scoliosis Psychosocial issues/ stress concerns: none  Preferred learning method:  Hands-on   Current weight: 219.5lbs Height: 5'6.5"  BMI: 34.9  Medications, supplements: reconciled list in medical record  Progress and evaluation:  Patient is testing BGs daily fasting, with results at 115 or less. Checked before dinner randomly at 92.   HbA1C ws 6.7% on 11/09/21. Both parents and one sibling also have/ had diabetes She has been making diet changes to reduce sugar including sweet tea, some sodas, starches Since making changes as of 10/2021, she reports weight loss of almost 20lbs. She would like to be able to lose 40-50 more pounds.   Physical activity: no regular activity   Dietary Intake:  Usual eating pattern includes 2-3 meals and 2 snacks per day. Dining out frequency: 2 meals per week.  Breakfast: if hungry-- plain cheerios, dry; sometimes skips Snack: none Lunch: salad; whole wheat sandwich with [place], generous amount lettuce  + parmesan crisps Snack: walnuts or almonds, less than handful Supper: fish/ chicken + steamed veg ie zucchini, squash, broccoli, carrots, mushrooms/ green beans with sm amount olive oil + Qadir rice (with fish), rarely baked potato with butter spray, stopped mashed potatoes Snack: 2Good yogurt or low fat cottage cheese with blueberries or fresh pear; occ small amount popcorn (skinny pop) Beverages: water, unsweetened tea, coffee with sugar free creamer no additional sweetener  Intervention:   Nutrition Care Education:   Basic nutrition: basic food groups; appropriate nutrient balance; appropriate meal and snack schedule; general nutrition guidelines    Weight control: determining reasonable weight loss rate; importance of low sugar and low fat choices; portion control; estimated energy needs  for weight loss at 1300-1400 kcal, provided guidance for 45% CHO, 25% pro, 30% fat; discussed role of physical activity Advanced nutrition:  food label reading for carbohydrate Diabetes: appropriate meal and snack schedule; appropriate carb intake and balance; healthy carb choices; role of fiber, protein, fat Hyperlipidemia:  healthy and unhealthy fats  Additional Notes: Patient has made significant dietary changes to control BGs and lose weight. Intake of carbohydrate is likely below level to meet basic nutritional needs; encouraged gradual increase to recommended 130g carb daily, mostly from whole grains, beans/ legumes, whole fruits Established nutrition goals with direction from patient.  No follow up scheduled at this time, patient to schedule later if needed.   Nutritional Diagnosis:  [place]-2.2 Altered nutrition-related laboratory As related to type 2 diabetes.  As evidenced by elevated HbA1C. [place]-3.3 Overweight/obesity As related to history of excess calories, inadequate physical activity due to scoliosis related pain, stress.  As evidenced by patient with current BMI of 34.9, working on diet and lifestyle changes to promote ongoing weight loss.   Education Materials given:  [employer] guidelines for Diabetes Museum/gallery conservator with food lists, sample meal pattern Sample menus Snacking handout Visit summary with goals/ instructions   Learner/ who was taught:  Patient   Level of understanding: Verbalizes/ demonstrates competency   Demonstrated degree of understanding via:   Teach back Learning barriers: None  Willingness to learn/ readiness for change: Eager, change in progress   Monitoring and Evaluation:  Dietary intake, exercise, BG control, and body weight      follow up: prn

## 2022-02-16 DIAGNOSIS — G4733 Obstructive sleep apnea (adult) (pediatric): Secondary | ICD-10-CM | POA: Diagnosis not present

## 2022-03-17 ENCOUNTER — Ambulatory Visit: Payer: BC Managed Care – PPO | Admitting: Family Medicine

## 2022-03-18 DIAGNOSIS — G4733 Obstructive sleep apnea (adult) (pediatric): Secondary | ICD-10-CM | POA: Diagnosis not present

## 2022-03-24 ENCOUNTER — Ambulatory Visit: Payer: BC Managed Care – PPO | Admitting: Family Medicine

## 2022-03-28 ENCOUNTER — Ambulatory Visit: Payer: BC Managed Care – PPO | Admitting: Family Medicine

## 2022-03-31 ENCOUNTER — Ambulatory Visit (INDEPENDENT_AMBULATORY_CARE_PROVIDER_SITE_OTHER): Payer: BC Managed Care – PPO | Admitting: Family Medicine

## 2022-03-31 ENCOUNTER — Encounter: Payer: Self-pay | Admitting: Family Medicine

## 2022-03-31 VITALS — BP 130/72 | HR 91 | Temp 98.0°F | Resp 16 | Ht 67.5 in | Wt 213.1 lb

## 2022-03-31 DIAGNOSIS — Z23 Encounter for immunization: Secondary | ICD-10-CM

## 2022-03-31 DIAGNOSIS — E669 Obesity, unspecified: Secondary | ICD-10-CM | POA: Diagnosis not present

## 2022-03-31 DIAGNOSIS — R7989 Other specified abnormal findings of blood chemistry: Secondary | ICD-10-CM | POA: Diagnosis not present

## 2022-03-31 DIAGNOSIS — E119 Type 2 diabetes mellitus without complications: Secondary | ICD-10-CM

## 2022-03-31 NOTE — Progress Notes (Signed)
 Name: Kathy Gregory   MRN: [id number]    DOB: 04/12/1951   Date:03/31/2022       Progress Note  Chief Complaint  Patient presents with   Follow-up   Diabetes     Subjective:   Kathy Gregory is a 71 y.o. female, presents to clinic for f/up  New onset DM, working on diet/lifestyle efforts, no meds Eliminated sugars carbs Lost weight - roughly 25 lbs Wt Readings from Last 5 Encounters:  03/31/22 213 lb 1.6 oz (96.7 kg)  01/27/22 219 lb 8 oz (99.6 kg)  12/23/21 226 lb (102.5 kg)  11/15/21 234 lb (106.1 kg)  11/09/21 237 lb 1.6 oz (107.5 kg)   BMI Readings from Last 5 Encounters:  03/31/22 32.88 kg/m  01/27/22 34.90 kg/m  12/23/21 34.87 kg/m  11/15/21 37.20 kg/m  11/09/21 37.70 kg/m    Lost 24 lbs worked with nutritionist and eliminated  a lot of sugars  DM:   New dx T2DM not on meds Blood sugars 90-110's Denies: Polyuria, polydipsia, vision changes, neuropathy, hypoglycemia Recent pertinent labs: Lab Results  Component Value Date   HGBA1C 6.7 (H) 11/09/2021   HGBA1C 5.8 (H) 05/06/2020   Lab Results  Component Value Date   MICROALBUR <0.2 11/15/2021   LDLCALC 69 05/11/2021   CREATININE 0.86 11/09/2021      Current Outpatient Medications:    aspirin EC 81 MG tablet, Take 1 tablet (81 mg total) by mouth daily., Disp: 90 tablet, Rfl: 3   atorvastatin (LIPITOR) 20 MG tablet, TAKE 1 TABLET BY MOUTH EVERYDAY AT BEDTIME, Disp: 90 tablet, Rfl: 3   blood glucose meter kit and supplies KIT, Dispense based on patient and insurance preference. Use up to four times daily as directed. (FOR ICD-9 250.00, 250.01)., Disp: 1 each, Rfl: 0   clonazePAM (KLONOPIN) 0.5 MG tablet, Take 0.5-1 tablets (0.25-0.5 mg total) by mouth daily as needed for anxiety. Never take within six hours of alcohol, Disp: 20 tablet, Rfl: 0   CONTOUR NEXT TEST test strip, USE UP TO 4 TIMES DAILY AS DIRECTED, Disp: 100 strip, Rfl: 5   Fish Oil-Cholecalciferol (FISH OIL + D3) 1000-1000 MG-UNIT  CAPS, Take by mouth daily. , Disp: , Rfl:    Microlet Lancets MISC, USE UP TO 4 TIMES DAILY AS DIRECTED, Disp: 100 each, Rfl: 6   Multiple Minerals-Vitamins (CALCIUM & VIT D3 BONE HEALTH PO), Take 2 tablets by mouth daily. Calcium 600mg- Vitamin D 400mg, Disp: , Rfl:    psyllium (REGULOID) 0.52 G capsule, Take 0.52 g by mouth daily., Disp: , Rfl:    traZODone (DESYREL) 50 MG tablet, Take 1 tablet (50 mg total) by mouth at bedtime as needed for sleep., Disp: 90 tablet, Rfl: 3  Patient Active Problem List   Diagnosis Date Noted   Dyspnea on exertion 12/25/2021   Heart murmur 12/25/2021   New onset type 2 diabetes mellitus (HCC) 11/15/2021   Prediabetes 11/09/2021   Lesion of face 11/09/2021   Class 2 severe obesity with serious comorbidity and body mass index (BMI) of 35.0 to 35.9 in adult (HCC) 05/11/2021   Insomnia 05/11/2021   OSA on CPAP 10/26/2018   Obesity (BMI 30.0-34.9) 01/18/2017   Panic attacks 01/18/2017   Current smoker 01/18/2017   Elevated hematocrit 07/20/2016   Coronary artery calcification 05/23/2016   Aortic atherosclerosis (HCC) 05/23/2016   Centrilobular emphysema (HCC) 05/23/2016   Osteopenia of neck of femur 04/18/2016   History of colonic polyps    Benign   neoplasm of sigmoid colon    Personal history of tobacco use, presenting hazards to health 04/13/2015   Hyperlipidemia LDL goal <70 08/07/2014    Past Surgical History:  Procedure Laterality Date   BREAST CYST ASPIRATION Left    COLONOSCOPY  2006   COLONOSCOPY WITH PROPOFOL N/A 05/15/2015   Procedure: COLONOSCOPY WITH PROPOFOL;  Surgeon: Lucilla Lame, MD;  Location: [facility];  Service: Endoscopy;  Laterality: N/A;   COLONOSCOPY WITH PROPOFOL N/A 06/02/2020   Procedure: COLONOSCOPY WITH PROPOFOL;  Surgeon: Lucilla Lame, MD;  Location: [facility] ENDOSCOPY;  Service: Endoscopy;  Laterality: N/A;   EYE SURGERY     POLYPECTOMY  05/15/2015   Procedure: POLYPECTOMY;  Surgeon: Lucilla Lame, MD;  Location:  [place];  Service: Endoscopy;;    Family History  Problem Relation Age of Onset   Hypertension Mother    Diabetes Mother    Heart disease Father 63       CABG   Stroke Father 59       Occurred during CABG   Diabetes Brother    Hypertension Brother    Atrial fibrillation Brother    Cirrhosis Brother    Kidney disease Maternal Grandmother    Breast cancer Paternal Grandmother     Social History   Tobacco Use   Smoking status: Every Day    Packs/day: 0.50    Years: 47.00    Total pack years: 23.50    Types: Cigarettes   Smokeless tobacco: Never  Vaping Use   Vaping Use: Never used  Substance Use Topics   Alcohol use: Yes    Alcohol/week: 1.0 - 2.0 standard drink of alcohol    Types: 1 - 2 Standard drinks or equivalent per week   Drug use: No     No Known Allergies  Health Maintenance  Topic Date Due   COVID-19 Vaccine (6 - Pfizer series) 08/13/2021   INFLUENZA VACCINE  03/22/2022   HEMOGLOBIN A1C  05/12/2022   MAMMOGRAM  06/14/2022   FOOT EXAM  11/16/2022   URINE MICROALBUMIN  11/16/2022   OPHTHALMOLOGY EXAM  12/17/2022   DEXA SCAN  06/15/2023   COLONOSCOPY (Pts 45-52yr Insurance coverage will need to be confirmed)  06/02/2025   TETANUS/TDAP  03/31/2032   Pneumonia Vaccine 70 Years old  Completed   Hepatitis C Screening  Completed   Zoster Vaccines- Shingrix  Completed   HPV VACCINES  Aged Out    Chart Review Today: I personally reviewed active problem list, medication list, allergies, family history, social history, health maintenance, notes from last encounter, lab results, imaging with the patient/caregiver today.   Review of Systems  Constitutional: Negative.   HENT: Negative.    Eyes: Negative.   Respiratory: Negative.    Cardiovascular: Negative.   Gastrointestinal: Negative.   Endocrine: Negative.   Genitourinary: Negative.   Musculoskeletal: Negative.   Skin: Negative.   Allergic/Immunologic: Negative.   Neurological:  Negative.   Hematological: Negative.   Psychiatric/Behavioral: Negative.    All other systems reviewed and are negative.    Objective:   Vitals:   03/31/22 0914  BP: 130/72  Pulse: 91  Resp: 16  Temp: 98 F (36.7 C)  TempSrc: Oral  SpO2: 98%  Weight: 213 lb 1.6 oz (96.7 kg)  Height: 5' 7.5" (1.715 m)    Body mass index is 32.88 kg/m.  Physical Exam Vitals and nursing note reviewed.  Constitutional:      General: She is not in acute distress.  Appearance: Normal appearance. She is well-developed. She is not ill-appearing, toxic-appearing or diaphoretic.  HENT:     Head: Normocephalic and atraumatic.     Right Ear: External ear normal.     Left Ear: External ear normal.     Nose: Nose normal.     Mouth/Throat:     Mouth: Mucous membranes are moist.     Pharynx: Oropharynx is clear.  Eyes:     General: No scleral icterus.       Right eye: No discharge.        Left eye: No discharge.     Conjunctiva/sclera: Conjunctivae normal.  Neck:     Trachea: No tracheal deviation.  Cardiovascular:     Rate and Rhythm: Normal rate and regular rhythm.     Pulses: Normal pulses.     Heart sounds: Normal heart sounds.  Pulmonary:     Effort: Pulmonary effort is normal. No respiratory distress.     Breath sounds: Normal breath sounds. No stridor.  Abdominal:     General: Bowel sounds are normal.  Musculoskeletal:     Right lower leg: No edema.     Left lower leg: No edema.  Skin:    General: Skin is warm and dry.     Findings: No rash.  Neurological:     Mental Status: She is alert. Mental status is at baseline.     Motor: No abnormal muscle tone.     Coordination: Coordination normal.     Gait: Gait normal.  Psychiatric:        Mood and Affect: Mood normal.        Behavior: Behavior normal.         Assessment & Plan:   Problem List Items Addressed This Visit       Endocrine   New onset type 2 diabetes mellitus ([place]) - Primary    No meds, has been working  on diet/lifestyle, shes lost significant amount of weight Expect A1C to be improved and in normal to prediabetic range      Relevant Orders   Hemoglobin A1c   COMPLETE METABOLIC PANEL WITH GFR   Hemoglobin A1c     Other   Obesity (BMI 30.0-34.9) (Chronic)   Relevant Orders   COMPLETE METABOLIC PANEL WITH GFR   Hemoglobin A1c   Other Visit Diagnoses     Need for tetanus booster       Relevant Orders   Tdap vaccine greater than or equal to 7yo IM (Completed)   Elevated LFTs       recheck   Relevant Orders   COMPLETE METABOLIC PANEL WITH GFR   Hemoglobin A1c        No follow-ups on file.   Delsa Grana, PA-C 03/31/22 9:51 AM

## 2022-03-31 NOTE — Assessment & Plan Note (Signed)
No meds, has been working on diet/lifestyle, shes lost significant amount of weight Expect A1C to be improved and in normal to prediabetic range

## 2022-04-01 LAB — COMPLETE METABOLIC PANEL WITH GFR
AG Ratio: 1.6 (calc) (ref 1.0–2.5)
ALT: 39 U/L — ABNORMAL HIGH (ref 6–29)
AST: 21 U/L (ref 10–35)
Albumin: 4.1 g/dL (ref 3.6–5.1)
Alkaline phosphatase (APISO): 91 U/L (ref 37–153)
BUN: 18 mg/dL (ref 7–25)
CO2: 28 mmol/L (ref 20–32)
Calcium: 9.5 mg/dL (ref 8.6–10.4)
Chloride: 108 mmol/L (ref 98–110)
Creat: 0.83 mg/dL (ref 0.50–1.05)
Globulin: 2.6 g/dL (calc) (ref 1.9–3.7)
Glucose, Bld: 85 mg/dL (ref 65–99)
Potassium: 4.2 mmol/L (ref 3.5–5.3)
Sodium: 143 mmol/L (ref 135–146)
Total Bilirubin: 0.4 mg/dL (ref 0.2–1.2)
Total Protein: 6.7 g/dL (ref 6.1–8.1)
eGFR: 76 mL/min/{1.73_m2} (ref >=60)

## 2022-04-01 LAB — HEMOGLOBIN A1C
Hgb A1c MFr Bld: 5.9 % of total Hgb — ABNORMAL HIGH (ref <5.7)
Mean Plasma Glucose: 123 mg/dL
eAG (mmol/L): 6.8 mmol/L

## 2022-04-18 DIAGNOSIS — G4733 Obstructive sleep apnea (adult) (pediatric): Secondary | ICD-10-CM | POA: Diagnosis not present

## 2022-05-03 ENCOUNTER — Other Ambulatory Visit: Payer: Self-pay | Admitting: Family Medicine

## 2022-05-03 DIAGNOSIS — Z1231 Encounter for screening mammogram for malignant neoplasm of breast: Secondary | ICD-10-CM

## 2022-05-11 NOTE — Patient Instructions (Addendum)
[place] at [facility] [place], [place] [address]  [phone]    Preventive Care 65 Years and Older, Female Preventive care refers to lifestyle choices and visits with your health care provider that can promote health and wellness. Preventive care visits are also called wellness exams. What can I expect for my preventive care visit? Counseling Your health care provider may ask you questions about your: Medical history, including: Past medical problems. Family medical history. Pregnancy and menstrual history. History of falls. Current health, including: Memory and ability to understand (cognition). Emotional well-being. Home life and relationship well-being. Sexual activity and sexual health. Lifestyle, including: Alcohol, nicotine or tobacco, and drug use. Access to firearms. Diet, exercise, and sleep habits. Work and work Statistician. Sunscreen use. Safety issues such as seatbelt and bike helmet use. Physical exam Your health care provider will check your: Height and weight. These may be used to calculate your BMI (body mass index). BMI is a measurement that tells if you are at a healthy weight. Waist circumference. This measures the distance around your waistline. This measurement also tells if you are at a healthy weight and may help predict your risk of certain diseases, such as type 2 diabetes and high blood pressure. Heart rate and blood pressure. Body temperature. Skin for abnormal spots. What immunizations do I need?  Vaccines are usually given at various ages, according to a schedule. Your health care provider will recommend vaccines for you based on your age, medical history, and lifestyle or other factors, such as travel or where you work. What tests do I need? Screening Your health care provider may recommend screening tests for certain conditions. This may include: Lipid and cholesterol  levels. Hepatitis C test. Hepatitis B test. HIV (human immunodeficiency virus) test. STI (sexually transmitted infection) testing, if you are at risk. Lung cancer screening. Colorectal cancer screening. Diabetes screening. This is done by checking your blood sugar (glucose) after you have not eaten for a while (fasting). Mammogram. Talk with your health care provider about how often you should have regular mammograms. BRCA-related cancer screening. This may be done if you have a family history of breast, ovarian, tubal, or peritoneal cancers. Bone density scan. This is done to screen for osteoporosis. Talk with your health care provider about your test results, treatment options, and if necessary, the need for more tests. Follow these instructions at home: Eating and drinking  Eat a diet that includes fresh fruits and vegetables, whole grains, lean protein, and low-fat dairy products. Limit your intake of foods with high amounts of sugar, saturated fats, and salt. Take vitamin and mineral supplements as recommended by your health care provider. Do not drink alcohol if your health care provider tells you not to drink. If you drink alcohol: Limit how much you have to 0-1 drink a day. Know how much alcohol is in your drink. In the [place] one drink equals one 12 oz bottle of beer (355 mL), one 5 oz glass of wine (148 mL), or one 1 oz glass of hard liquor (44 mL). Lifestyle Brush your teeth every morning and night with fluoride toothpaste. Floss one time each day. Exercise for at least 30 minutes 5 or more days each week. Do not use any products that contain nicotine or tobacco. These products include cigarettes, chewing tobacco, and vaping devices, such as e-cigarettes. If you need help quitting, ask your health care provider. Do not use drugs. If you are sexually active,  practice safe sex. Use a condom or other form of protection in order to prevent STIs. Take aspirin only as told by your  health care provider. Make sure that you understand how much to take and what form to take. Work with your health care provider to find out whether it is safe and beneficial for you to take aspirin daily. Ask your health care provider if you need to take a cholesterol-lowering medicine (statin). Find healthy ways to manage stress, such as: Meditation, yoga, or listening to music. Journaling. Talking to a trusted person. Spending time with friends and family. Minimize exposure to UV radiation to reduce your risk of skin cancer. Safety Always wear your seat belt while driving or riding in a vehicle. Do not drive: If you have been drinking alcohol. Do not ride with someone who has been drinking. When you are tired or distracted. While texting. If you have been using any mind-altering substances or drugs. Wear a helmet and other protective equipment during sports activities. If you have firearms in your house, make sure you follow all gun safety procedures. What's next? Visit your health care provider once a year for an annual wellness visit. Ask your health care provider how often you should have your eyes and teeth checked. Stay up to date on all vaccines. This information is not intended to replace advice given to you by your health care provider. Make sure you discuss any questions you have with your health care provider. Document Revised: 02/03/2021 Document Reviewed: 02/03/2021 Elsevier Patient Education  [place].

## 2022-05-16 ENCOUNTER — Encounter: Payer: Self-pay | Admitting: Family Medicine

## 2022-05-16 ENCOUNTER — Ambulatory Visit (INDEPENDENT_AMBULATORY_CARE_PROVIDER_SITE_OTHER): Payer: BC Managed Care – PPO | Admitting: Family Medicine

## 2022-05-16 VITALS — BP 120/74 | HR 92 | Temp 98.1°F | Resp 16 | Ht 66.5 in | Wt 210.4 lb

## 2022-05-16 DIAGNOSIS — Z23 Encounter for immunization: Secondary | ICD-10-CM | POA: Diagnosis not present

## 2022-05-16 DIAGNOSIS — Z0000 Encounter for general adult medical examination without abnormal findings: Secondary | ICD-10-CM | POA: Diagnosis not present

## 2022-05-16 DIAGNOSIS — E785 Hyperlipidemia, unspecified: Secondary | ICD-10-CM | POA: Diagnosis not present

## 2022-05-16 DIAGNOSIS — F17200 Nicotine dependence, unspecified, uncomplicated: Secondary | ICD-10-CM

## 2022-05-16 DIAGNOSIS — F410 Panic disorder [episodic paroxysmal anxiety] without agoraphobia: Secondary | ICD-10-CM

## 2022-05-16 DIAGNOSIS — G4700 Insomnia, unspecified: Secondary | ICD-10-CM

## 2022-05-16 DIAGNOSIS — I700 Atherosclerosis of aorta: Secondary | ICD-10-CM

## 2022-05-16 DIAGNOSIS — I2510 Atherosclerotic heart disease of native coronary artery without angina pectoris: Secondary | ICD-10-CM

## 2022-05-16 MED ORDER — TRAZODONE HCL 50 MG PO TABS: 50.0000 mg | ORAL_TABLET | Freq: Every evening | ORAL | 3 refills | Status: DC | PRN

## 2022-05-16 MED ORDER — ATORVASTATIN CALCIUM 20 MG PO TABS: ORAL_TABLET | ORAL | 3 refills | Status: DC

## 2022-05-16 MED ORDER — CLONAZEPAM 0.5 MG PO TABS: 0.2500 mg | ORAL_TABLET | Freq: Every day | ORAL | 0 refills | Status: DC | PRN

## 2022-05-16 NOTE — Progress Notes (Unsigned)
Patient: Kathy Gregory, Female    DOB: May 14, 1951, 71 y.o.   MRN: [id number] Danelle Berry, PA-C Visit Date: 05/16/2022  Today's Provider: Danelle Berry, PA-C   Chief Complaint  Patient presents with   Annual Exam   Subjective:   Annual physical exam:  Kathy Gregory is a 71 y.o. female who presents today for complete physical exam:  Exercise/Activity: Diet/nutrition: Sleep:   SDOH Screenings   Food Insecurity: No Food Insecurity (05/16/2022)  Housing: Low Risk  (05/16/2022)  Transportation Needs: No Transportation Needs (05/16/2022)  Utilities: Not At Risk (05/16/2022)  Alcohol Screen: Low Risk  (05/16/2022)  Depression (PHQ2-9): Low Risk  (05/16/2022)  Financial Resource Strain: Low Risk  (05/16/2022)  Physical Activity: Insufficiently Active (05/16/2022)  Social Connections: Socially Isolated (05/16/2022)  Stress: No Stress Concern Present (05/16/2022)  Tobacco Use: High Risk (05/16/2022)      USPSTF grade A and B recommendations - reviewed and addressed today  Depression:  Phq 9 completed today by patient, was reviewed by me with patient in the room PHQ score is neg, pt feels overall good, but does request a refill on her klonopin which she gets one rx once a year  Some stress with her daughters health and surgery with complications     05/16/2022   10:10 AM 03/31/2022    9:13 AM 01/27/2022    4:41 PM 11/15/2021   10:56 AM  PHQ 2/9 Scores  PHQ - 2 Score 0 0 0 0  PHQ- 9 Score 0 0  0      05/16/2022   10:10 AM 03/31/2022    9:13 AM 01/27/2022    4:41 PM 11/15/2021   10:56 AM 11/09/2021    9:14 AM  Depression screen PHQ 2/9  Decreased Interest 0 0 0 0 0  Down, Depressed, Hopeless 0 0 0 0 0  PHQ - 2 Score 0 0 0 0 0  Altered sleeping 0 0  0 0  Tired, decreased energy 0 0  0 0  Change in appetite 0 0  0 0  Feeling bad or failure about yourself  0 0  0 0  Trouble concentrating 0 0  0 0  Moving slowly or fidgety/restless 0 0  0 0  Suicidal thoughts 0 0  0 0  PHQ-9  Score 0 0  0 0  Difficult doing work/chores Not difficult at all Not difficult at all  Not difficult at all Not difficult at all    Alcohol screening: Flowsheet Row Office Visit from 05/16/2022 in [facility]  AUDIT-C Score 7       Immunizations and Health Maintenance: Health Maintenance  Topic Date Due   Diabetic kidney evaluation - Urine ACR  Never done   COVID-19 Vaccine (6 - Pfizer series) 06/01/2022 (Originally 08/13/2021)   MAMMOGRAM  06/14/2022   HEMOGLOBIN A1C  10/01/2022   FOOT EXAM  11/16/2022   OPHTHALMOLOGY EXAM  12/17/2022   Diabetic kidney evaluation - GFR measurement  04/01/2023   DEXA SCAN  06/15/2023   COLONOSCOPY (Pts 45-32yrs Insurance coverage will need to be confirmed)  06/02/2025   TETANUS/TDAP  03/31/2032   Pneumonia Vaccine 73+ Years old  Completed   INFLUENZA VACCINE  Completed   Hepatitis C Screening  Completed   Zoster Vaccines- Shingrix  Completed   HPV VACCINES  Aged Out     Hep C Screening: done  STD testing and prevention (HIV/chl/gon/syphilis):  see above, no additional testing desired by pt today--  none needed  Intimate partner violence:safe   Sexual History/Pain during Intercourse: Widowed  Menstrual History/LMP/Abnormal Bleeding: none denies No LMP recorded. Patient is postmenopausal.  Incontinence Symptoms: none  Breast cancer:   scheduled for next month Last Mammogram: *see HM list above BRCA gene screening: ***  Osteoporosis:  - calcium and vit d, Frax and last year's dexa reviewed  Discussion on osteoporosis per age, including high calcium and vitamin D supplementation, weight bearing exercises Pt is *** supplementing with daily calcium/Vit D. *** Bone scan/dexa Roughly experienced menopause at age ***  Skin cancer:  Hx of skin CA -  NO  She went to dermatology and was checked out  Discussed atypical lesions   Colorectal cancer:   Colonoscopy is up to date  Discussed concerning signs and sx of CRC,  pt denies ***  Lung cancer:   Low Dose CT Chest recommended if Age 65-80 years, 20 pack-year currently smoking OR have quit w/in 15years. Patient does qualify.    Social History   Tobacco Use   Smoking status: Every Day    Packs/day: 0.50    Years: 47.00    Total pack years: 23.50    Types: Cigarettes   Smokeless tobacco: Never  Vaping Use   Vaping Use: Never used  Substance Use Topics   Alcohol use: Yes    Alcohol/week: 4.0 standard drinks of alcohol    Types: 4 Glasses of wine per week   Drug use: No     Flowsheet Row [facility] from [facility]/16/20[facility] in [facility]  AUDIT-C Score 7       Family History  Problem Relation Age of Onset   Hypertension Mother    Diabetes Mother    Heart disease Father 45       CABG   Stroke Father 29       Occurred during CABG   Diabetes Brother    Hypertension Brother    Atrial fibrillation Brother    Cirrhosis Brother    Kidney disease Maternal Grandmother    Breast cancer Paternal Grandmother      Blood pressure/Hypertension: BP Readings from Last 3 Encounters:  05/16/22 120/74  03/31/22 130/72  12/23/21 124/80    Weight/Obesity: Wt Readings from Last 3 Encounters:  05/16/22 210 lb 6.4 oz (95.4 kg)  03/31/22 213 lb 1.6 oz (96.7 kg)  01/27/22 219 lb 8 oz (99.6 kg)   BMI Readings from Last 3 Encounters:  05/16/22 33.45 kg/m  03/31/22 32.88 kg/m  01/27/22 34.90 kg/m     Lipids:  Lab Results  Component Value Date   CHOL 141 05/11/2021   CHOL 136 05/06/2020   CHOL 134 10/31/2019   Lab Results  Component Value Date   HDL 54 05/11/2021   HDL 58 05/06/2020   HDL 52 10/31/2019   Lab Results  Component Value Date   LDLCALC 69 05/11/2021   LDLCALC 62 05/06/2020   LDLCALC 65 10/31/2019   Lab Results  Component Value Date   TRIG 98 05/11/2021   TRIG 78 05/06/2020   TRIG 83 10/31/2019   Lab Results  Component Value Date   CHOLHDL 2.6 05/11/2021   CHOLHDL 2.3 05/06/2020   CHOLHDL 2.6  10/31/2019   No results found for: "LDLDIRECT" Based on the results of lipid panel his/her cardiovascular risk factor ( using [place] )  in the next 10 years is: The 10-year ASCVD risk score (Arnett DK, et al., 2019) is: 22.3%   Values used to calculate  the score:     Age: 57 years     Sex: Female     Is Non-Hispanic African American: No     Diabetic: Yes     Tobacco smoker: Yes     Systolic Blood Pressure: 973 mmHg     Is BP treated: No     HDL Cholesterol: 54 mg/dL     Total Cholesterol: 141 mg/dL  Glucose:  Glucose, Bld  Date Value Ref Range Status  03/31/2022 85 65 - 99 mg/dL Final    Comment:    .            Fasting reference interval .   11/09/2021 109 (H) 65 - 99 mg/dL Final    Comment:    .            Fasting reference interval . For someone without known diabetes, a glucose value between 100 and 125 mg/dL is consistent with prediabetes and should be confirmed with a follow-up test. .   05/11/2021 90 65 - 99 mg/dL Final    Comment:    .            Fasting reference interval .     Advanced Care Planning:  A voluntary discussion about advance care planning including the explanation and discussion of advance directives.   Discussed health care proxy and Living will, and the patient was able to identify a health care proxy as Almyra Free.   Patient does not have a living will at present time.   Social History       Social History   Socioeconomic History   Marital status: Widowed    Spouse name: Not on file   Number of children: Not on file   Years of education: Not on file   Highest education level: Not on file  Occupational History   Not on file  Tobacco Use   Smoking status: Every Day    Packs/day: 0.50    Years: 47.00    Total pack years: 23.50    Types: Cigarettes   Smokeless tobacco: Never  Vaping Use   Vaping Use: Never used  Substance and Sexual Activity   Alcohol use: Yes    Alcohol/week: 4.0 standard drinks of alcohol    Types: 4  Glasses of wine per week   Drug use: No   Sexual activity: Not Currently  Other Topics Concern   Not on file  Social History Narrative   Not on file   Social Determinants of Health   Financial Resource Strain: Low Risk  (05/16/2022)   Overall Financial Resource Strain (CARDIA)    Difficulty of Paying Living Expenses: Not hard at all  Food Insecurity: No Food Insecurity (05/16/2022)   Hunger Vital Sign    Worried About Running Out of Food in the Last Year: Never true    Ran Out of Food in the Last Year: Never true  Transportation Needs: No Transportation Needs (05/16/2022)   PRAPARE - Hydrologist (Medical): No    Lack of Transportation (Non-Medical): No  Physical Activity: Insufficiently Active (05/16/2022)   Exercise Vital Sign    Days of Exercise per Week: 3 days    Minutes of Exercise per Session: 20 min  Stress: No Stress Concern Present (05/16/2022)   [place]    Feeling of Stress : Only a little  Social Connections: Socially Isolated (05/16/2022)   Social Connection and Isolation Panel [NHANES]  Frequency of Communication with Friends and Family: More than three times a week    Frequency of Social Gatherings with Friends and Family: More than three times a week    Attends Religious Services: Never    Marine scientist or Organizations: No    Attends Archivist Meetings: Never    Marital Status: Widowed    Family History        Family History  Problem Relation Age of Onset   Hypertension Mother    Diabetes Mother    Heart disease Father 33       CABG   Stroke Father 2       Occurred during CABG   Diabetes Brother    Hypertension Brother    Atrial fibrillation Brother    Cirrhosis Brother    Kidney disease Maternal Grandmother    Breast cancer Paternal Grandmother     Patient Active Problem List   Diagnosis Date Noted   Dyspnea on exertion  12/25/2021   Heart murmur 12/25/2021   New onset type 2 diabetes mellitus ([place]) 11/15/2021   Prediabetes 11/09/2021   Lesion of face 11/09/2021   Class 2 severe obesity with serious comorbidity and body mass index (BMI) of 35.0 to 35.9 in adult ([place]) 05/11/2021   Insomnia 05/11/2021   OSA on CPAP 10/26/2018   Obesity (BMI 30.0-34.9) 01/18/2017   Panic attacks 01/18/2017   Current smoker 01/18/2017   Elevated hematocrit 07/20/2016   Coronary artery calcification 05/23/2016   Aortic atherosclerosis ([place]) 05/23/2016   Centrilobular emphysema ([place]) 05/23/2016   Osteopenia of neck of femur 04/18/2016   History of colonic polyps    Benign neoplasm of sigmoid colon    Personal history of tobacco use, presenting hazards to health 04/13/2015   Hyperlipidemia LDL goal <70 08/07/2014    Past Surgical History:  Procedure Laterality Date   BREAST CYST ASPIRATION Left    COLONOSCOPY  2006   COLONOSCOPY WITH PROPOFOL N/A 05/15/2015   Procedure: COLONOSCOPY WITH PROPOFOL;  Surgeon: Lucilla Lame, MD;  Location: [place];  Service: Endoscopy;  Laterality: N/A;   COLONOSCOPY WITH PROPOFOL N/A 06/02/2020   Procedure: COLONOSCOPY WITH PROPOFOL;  Surgeon: Lucilla Lame, MD;  Location: [facility] ENDOSCOPY;  Service: Endoscopy;  Laterality: N/A;   EYE SURGERY     POLYPECTOMY  05/15/2015   Procedure: POLYPECTOMY;  Surgeon: Lucilla Lame, MD;  Location: [place];  Service: Endoscopy;;     Current Outpatient Medications:    aspirin EC 81 MG tablet, Take 1 tablet (81 mg total) by mouth daily., Disp: 90 tablet, Rfl: 3   atorvastatin (LIPITOR) 20 MG tablet, TAKE 1 TABLET BY MOUTH EVERYDAY AT BEDTIME, Disp: 90 tablet, Rfl: 3   blood glucose meter kit and supplies KIT, Dispense based on patient and insurance preference. Use up to four times daily as directed. (FOR ICD-9 250.00, 250.01)., Disp: 1 each, Rfl: 0   clonazePAM (KLONOPIN) 0.5 MG tablet, Take 0.5-1 tablets (0.25-0.5 mg total) by mouth  daily as needed for anxiety. Never take within six hours of alcohol, Disp: 20 tablet, Rfl: 0   CONTOUR NEXT TEST test strip, USE UP TO 4 TIMES DAILY AS DIRECTED, Disp: 100 strip, Rfl: 5   Fish Oil-Cholecalciferol (FISH OIL + D3) 1000-1000 MG-UNIT CAPS, Take by mouth daily. , Disp: , Rfl:    Microlet Lancets MISC, USE UP TO 4 TIMES DAILY AS DIRECTED, Disp: 100 each, Rfl: 6   Multiple Minerals-Vitamins (CALCIUM & VIT D3 BONE  HEALTH PO), Take 2 tablets by mouth daily. Calcium $RemoveBefore'600mg'BfKMtqVaUZXCv$ - Vitamin D $RemoveB'400mg'MNdJLWyU$ , Disp: , Rfl:    psyllium (REGULOID) 0.52 G capsule, Take 0.52 g by mouth daily., Disp: , Rfl:    traZODone (DESYREL) 50 MG tablet, Take 1 tablet (50 mg total) by mouth at bedtime as needed for sleep., Disp: 90 tablet, Rfl: 3  No Known Allergies  Patient Care Team: Delsa Grana, PA-C as PCP - General (Family Medicine) Laverle Hobby, MD as Consulting Physician (Pulmonary Disease) End, Harrell Gave, MD as Consulting Physician (Cardiology)   Chart Review: I personally reviewed active problem list, medication list, allergies, family history, social history, health maintenance, notes from last encounter, lab results, imaging with the patient/caregiver today.   Review of Systems  Constitutional: Negative.   HENT: Negative.    Eyes: Negative.   Respiratory: Negative.    Cardiovascular: Negative.   Gastrointestinal: Negative.   Endocrine: Negative.   Genitourinary: Negative.   Musculoskeletal: Negative.   Skin: Negative.   Allergic/Immunologic: Negative.   Neurological: Negative.   Hematological: Negative.   Psychiatric/Behavioral: Negative.    All other systems reviewed and are negative.         Objective:   Vitals:  Vitals:   05/16/22 1020  BP: 120/74  Pulse: 92  Resp: 16  Temp: 98.1 F (36.7 C)  TempSrc: Oral  SpO2: 97%  Weight: 210 lb 6.4 oz (95.4 kg)  Height: 5' 6.5" (1.689 m)    Body mass index is 33.45 kg/m.  Physical Exam Constitutional:      General: She is  not in acute distress.    Appearance: Normal appearance. She is well-developed. She is not toxic-appearing or diaphoretic.  HENT:     Head: Normocephalic and atraumatic.     Right Ear: External ear normal.     Left Ear: External ear normal.     Nose: Nose normal.     Mouth/Throat:     Pharynx: Uvula midline.  Eyes:     General: Lids are normal.     Conjunctiva/sclera: Conjunctivae normal.     Pupils: Pupils are equal, round, and reactive to light.  Neck:     Trachea: Phonation normal. No tracheal deviation.  Cardiovascular:     Rate and Rhythm: Normal rate and regular rhythm.     Pulses: Normal pulses.          Radial pulses are 2+ on the right side and 2+ on the left side.       Posterior tibial pulses are 2+ on the right side and 2+ on the left side.     Heart sounds: Normal heart sounds. No murmur heard.    No friction rub. No gallop.  Pulmonary:     Effort: Pulmonary effort is normal. No respiratory distress.     Breath sounds: Normal breath sounds. No stridor. No wheezing, rhonchi or rales.  Chest:     Chest wall: No tenderness.  Abdominal:     General: Bowel sounds are normal. There is no distension.     Palpations: Abdomen is soft.     Tenderness: There is no abdominal tenderness. There is no guarding or rebound.  Musculoskeletal:        General: No deformity. Normal range of motion.     Cervical back: Normal range of motion and neck supple.  Lymphadenopathy:     Cervical: No cervical adenopathy.  Skin:    General: Skin is warm and dry.     Capillary Refill: Capillary refill takes  less than 2 seconds.     Coloration: Skin is not pale.     Findings: No rash.  Neurological:     Mental Status: She is alert and oriented to person, place, and time.     Motor: No abnormal muscle tone.     Gait: Gait normal.  Psychiatric:        Speech: Speech normal.        Behavior: Behavior normal.       Fall Risk:    05/16/2022   10:09 AM 03/31/2022    9:12 AM 01/27/2022     4:39 PM 11/15/2021   10:56 AM 11/09/2021    9:13 AM  Fall Risk   Falls in the past year? $RemoveBe'1 1 1 'PvmStUghQ$ 0 0  Comment   12/09/21 tripped on uneven pavement    Number falls in past yr: 0 0 0 0 0  Injury with Fall? $RemoveBe'1 1 1 'gqCDpYuQl$ 0 0  Comment   abrasions, bruising    Risk for fall due to : Impaired balance/gait;Impaired mobility Impaired balance/gait;Impaired mobility  No Fall Risks No Fall Risks  Follow up Falls prevention discussed;Education provided Education provided;Falls prevention discussed  Falls prevention discussed Falls prevention discussed    Functional Status Survey: Is the patient deaf or have difficulty hearing?: No Does the patient have difficulty seeing, even when wearing glasses/contacts?: No Does the patient have difficulty concentrating, remembering, or making decisions?: No Does the patient have difficulty walking or climbing stairs?: Yes Does the patient have difficulty dressing or bathing?: No Does the patient have difficulty doing errands alone such as visiting a doctor's office or shopping?: No   Assessment & Plan:    CPE completed today  USPSTF grade A and B recommendations reviewed with patient; age-appropriate recommendations, preventive care, screening tests, etc discussed and encouraged; healthy living encouraged; see AVS for patient education given to patient  Discussed importance of 150 minutes of physical activity weekly, AHA exercise recommendations given to pt in AVS/handout  Discussed importance of healthy diet:  eating lean meats and proteins, avoiding trans fats and saturated fats, avoid simple sugars and excessive carbs in diet, eat 6 servings of fruit/vegetables daily and drink plenty of water and avoid sweet beverages.    Recommended pt to do annual eye exam and routine dental exams/cleanings  Depression, alcohol, fall screening completed as documented above and per flowsheets  Advance Care planning information and packet discussed and offered today, encouraged pt  to discuss with family members/spouse/partner/friends and complete Advanced directive packet and bring copy to office   Reviewed Health Maintenance: Health Maintenance  Topic Date Due   Diabetic kidney evaluation - Urine ACR  Never done   COVID-19 Vaccine (6 - Pfizer series) 06/01/2022 (Originally 08/13/2021)   MAMMOGRAM  06/14/2022   HEMOGLOBIN A1C  10/01/2022   FOOT EXAM  11/16/2022   OPHTHALMOLOGY EXAM  12/17/2022   Diabetic kidney evaluation - GFR measurement  04/01/2023   DEXA SCAN  06/15/2023   COLONOSCOPY (Pts 45-48yrs Insurance coverage will need to be confirmed)  06/02/2025   TETANUS/TDAP  03/31/2032   Pneumonia Vaccine 63+ Years old  Completed   INFLUENZA VACCINE  Completed   Hepatitis C Screening  Completed   Zoster Vaccines- Shingrix  Completed   HPV VACCINES  Aged Out    Immunizations: Immunization History  Administered Date(s) Administered   Fluad Quad(high Dose 65+) 05/03/2019, 05/06/2020, 05/11/2021, 05/16/2022   Influenza, High Dose Seasonal PF 04/26/2018   Influenza,inj,Quad PF,6+ Mos 04/18/2016, 04/20/2017  PFIZER(Purple Top)SARS-COV-2 Vaccination 10/11/2019, 11/01/2019, 06/05/2020, 12/14/2020, 06/18/2021   Pneumococcal Conjugate-13 04/26/2018   Pneumococcal Polysaccharide-23 08/19/2013, 05/03/2019   Tdap 02/07/2012, 03/31/2022   Zoster Recombinat (Shingrix) 06/15/2018, 08/27/2018   Vaccines:  HPV: up to at age 4 , ask insurance if age between 34-45  Shingrix: 75-64 yo and ask insurance if covered when patient above 52 yo Pneumonia: *** educated and discussed with patient. Flu: *** educated and discussed with patient. COVID:      ICD-10-CM   1. Annual physical exam  Z00.00     2. Need for influenza vaccination  Z23 Flu Vaccine QUAD High Dose(Fluad)    3. Hyperlipidemia LDL goal <70  E78.5           Delsa Grana, PA-C 05/16/22 10:32 AM  [facility]

## 2022-05-17 LAB — CBC WITH DIFFERENTIAL/PLATELET
Absolute Monocytes: 710 cells/uL (ref 200–950)
Basophils Absolute: 91 cells/uL (ref 0–200)
Basophils Relative: 1 %
Eosinophils Absolute: 109 cells/uL (ref 15–500)
Eosinophils Relative: 1.2 %
HCT: 45.1 % — ABNORMAL HIGH (ref 35.0–45.0)
Hemoglobin: 15.9 g/dL — ABNORMAL HIGH (ref 11.7–15.5)
Lymphs Abs: 1720 cells/uL (ref 850–3900)
MCH: 34 pg — ABNORMAL HIGH (ref 27.0–33.0)
MCHC: 35.3 g/dL (ref 32.0–36.0)
MCV: 96.6 fL (ref 80.0–100.0)
MPV: 11.3 fL (ref 7.5–12.5)
Monocytes Relative: 7.8 %
Neutro Abs: 6470 cells/uL (ref 1500–7800)
Neutrophils Relative %: 71.1 %
Platelets: 292 10*3/uL (ref 140–400)
RBC: 4.67 10*6/uL (ref 3.80–5.10)
RDW: 12.3 % (ref 11.0–15.0)
Total Lymphocyte: 18.9 %
WBC: 9.1 10*3/uL (ref 3.8–10.8)

## 2022-05-17 LAB — LIPID PANEL
Cholesterol: 125 mg/dL (ref <200)
HDL: 52 mg/dL (ref >=50)
LDL Cholesterol (Calc): 53 mg/dL (calc)
Non-HDL Cholesterol (Calc): 73 mg/dL (calc) (ref <130)
Total CHOL/HDL Ratio: 2.4 (calc) (ref <5.0)
Triglycerides: 112 mg/dL (ref <150)

## 2022-05-17 LAB — COMPLETE METABOLIC PANEL WITH GFR
AG Ratio: 1.6 (calc) (ref 1.0–2.5)
ALT: 29 U/L (ref 6–29)
AST: 16 U/L (ref 10–35)
Albumin: 4.2 g/dL (ref 3.6–5.1)
Alkaline phosphatase (APISO): 89 U/L (ref 37–153)
BUN: 18 mg/dL (ref 7–25)
CO2: 27 mmol/L (ref 20–32)
Calcium: 9.8 mg/dL (ref 8.6–10.4)
Chloride: 107 mmol/L (ref 98–110)
Creat: 0.84 mg/dL (ref 0.60–1.00)
Globulin: 2.6 g/dL (calc) (ref 1.9–3.7)
Glucose, Bld: 87 mg/dL (ref 65–99)
Potassium: 4.2 mmol/L (ref 3.5–5.3)
Sodium: 141 mmol/L (ref 135–146)
Total Bilirubin: 0.4 mg/dL (ref 0.2–1.2)
Total Protein: 6.8 g/dL (ref 6.1–8.1)
eGFR: 75 mL/min/{1.73_m2} (ref >=60)

## 2022-05-18 ENCOUNTER — Encounter: Payer: Self-pay | Admitting: Family Medicine

## 2022-05-19 DIAGNOSIS — G4733 Obstructive sleep apnea (adult) (pediatric): Secondary | ICD-10-CM | POA: Diagnosis not present

## 2022-06-16 ENCOUNTER — Ambulatory Visit
Admission: RE | Admit: 2022-06-16 | Discharge: 2022-06-16 | Disposition: A | Discharge status: Home / Self Care | Payer: BC Managed Care – PPO | Source: Ambulatory Visit | Attending: Family Medicine | Admitting: Family Medicine

## 2022-06-16 DIAGNOSIS — Z1231 Encounter for screening mammogram for malignant neoplasm of breast: Secondary | ICD-10-CM | POA: Insufficient documentation

## 2022-06-16 DIAGNOSIS — H40053 Ocular hypertension, bilateral: Secondary | ICD-10-CM | POA: Diagnosis not present

## 2022-06-17 ENCOUNTER — Other Ambulatory Visit: Payer: Self-pay | Admitting: Family Medicine

## 2022-06-18 ENCOUNTER — Other Ambulatory Visit: Payer: Self-pay | Admitting: Family Medicine

## 2022-06-18 DIAGNOSIS — G4733 Obstructive sleep apnea (adult) (pediatric): Secondary | ICD-10-CM | POA: Diagnosis not present

## 2022-06-23 ENCOUNTER — Other Ambulatory Visit: Payer: Self-pay | Admitting: Family Medicine

## 2022-06-23 DIAGNOSIS — G4700 Insomnia, unspecified: Secondary | ICD-10-CM

## 2022-07-19 DIAGNOSIS — G4733 Obstructive sleep apnea (adult) (pediatric): Secondary | ICD-10-CM | POA: Diagnosis not present

## 2022-08-04 ENCOUNTER — Other Ambulatory Visit: Payer: Self-pay | Admitting: Family Medicine

## 2022-08-04 DIAGNOSIS — G4700 Insomnia, unspecified: Secondary | ICD-10-CM

## 2022-08-18 DIAGNOSIS — G4733 Obstructive sleep apnea (adult) (pediatric): Secondary | ICD-10-CM | POA: Diagnosis not present

## 2022-09-13 ENCOUNTER — Encounter: Payer: Self-pay | Admitting: *Deleted

## 2022-09-13 ENCOUNTER — Other Ambulatory Visit: Payer: Self-pay

## 2022-09-13 ENCOUNTER — Emergency Department: Payer: BC Managed Care – PPO

## 2022-09-13 DIAGNOSIS — Z7982 Long term (current) use of aspirin: Secondary | ICD-10-CM | POA: Insufficient documentation

## 2022-09-13 DIAGNOSIS — E119 Type 2 diabetes mellitus without complications: Secondary | ICD-10-CM | POA: Insufficient documentation

## 2022-09-13 DIAGNOSIS — I2510 Atherosclerotic heart disease of native coronary artery without angina pectoris: Secondary | ICD-10-CM | POA: Diagnosis not present

## 2022-09-13 DIAGNOSIS — G454 Transient global amnesia: Principal | ICD-10-CM | POA: Insufficient documentation

## 2022-09-13 DIAGNOSIS — Z79899 Other long term (current) drug therapy: Secondary | ICD-10-CM | POA: Insufficient documentation

## 2022-09-13 DIAGNOSIS — F17210 Nicotine dependence, cigarettes, uncomplicated: Secondary | ICD-10-CM | POA: Insufficient documentation

## 2022-09-13 DIAGNOSIS — R4182 Altered mental status, unspecified: Secondary | ICD-10-CM | POA: Diagnosis not present

## 2022-09-13 DIAGNOSIS — R29818 Other symptoms and signs involving the nervous system: Secondary | ICD-10-CM | POA: Diagnosis not present

## 2022-09-13 DIAGNOSIS — R413 Other amnesia: Secondary | ICD-10-CM | POA: Diagnosis not present

## 2022-09-13 DIAGNOSIS — G930 Cerebral cysts: Secondary | ICD-10-CM | POA: Diagnosis not present

## 2022-09-13 DIAGNOSIS — R9431 Abnormal electrocardiogram [ECG] [EKG]: Secondary | ICD-10-CM | POA: Diagnosis not present

## 2022-09-13 LAB — URINALYSIS, ROUTINE W REFLEX MICROSCOPIC
Bilirubin Urine: NEGATIVE
Glucose, UA: NEGATIVE mg/dL
Ketones, ur: NEGATIVE mg/dL
Nitrite: NEGATIVE
Protein, ur: NEGATIVE mg/dL
Specific Gravity, Urine: 1.003 — ABNORMAL LOW (ref 1.005–1.030)
pH: 6 (ref 5.0–8.0)

## 2022-09-13 LAB — CBC
HCT: 47.9 % — ABNORMAL HIGH (ref 36.0–46.0)
Hemoglobin: 16 g/dL — ABNORMAL HIGH (ref 12.0–15.0)
MCH: 32.9 pg (ref 26.0–34.0)
MCHC: 33.4 g/dL (ref 30.0–36.0)
MCV: 98.6 fL (ref 80.0–100.0)
Platelets: 275 10*3/uL (ref 150–400)
RBC: 4.86 MIL/uL (ref 3.87–5.11)
RDW: 13.2 % (ref 11.5–15.5)
WBC: 9.9 10*3/uL (ref 4.0–10.5)
nRBC: 0 % (ref 0.0–0.2)

## 2022-09-13 LAB — BASIC METABOLIC PANEL
Anion gap: 9 (ref 5–15)
BUN: 26 mg/dL — ABNORMAL HIGH (ref 8–23)
CO2: 24 mmol/L (ref 22–32)
Calcium: 9.8 mg/dL (ref 8.9–10.3)
Chloride: 108 mmol/L (ref 98–111)
Creatinine, Ser: 0.77 mg/dL (ref 0.44–1.00)
GFR, Estimated: >60 mL/min (ref >60)
Glucose, Bld: 103 mg/dL — ABNORMAL HIGH (ref 70–99)
Potassium: 4.1 mmol/L (ref 3.5–5.1)
Sodium: 141 mmol/L (ref 135–145)

## 2022-09-13 LAB — TROPONIN I (HIGH SENSITIVITY)
Troponin I (High Sensitivity): 8 ng/L (ref <18)
Troponin I (High Sensitivity): 16 ng/L (ref <18)

## 2022-09-13 NOTE — ED Triage Notes (Signed)
Daughter states mother has a memory loss of the the present and past.  Sx began 39 today.  No headache.  No known injury.  Pt got off work today, bought a pizza and put it in the trunk.  Pt does not rememeber checking in the ER.  Pt asking repetitve questions  pt alert.

## 2022-09-14 ENCOUNTER — Emergency Department: Payer: BC Managed Care – PPO

## 2022-09-14 ENCOUNTER — Observation Stay (HOSPITAL_BASED_OUTPATIENT_CLINIC_OR_DEPARTMENT_OTHER)
Admit: 2022-09-14 | Discharge: 2022-09-14 | Disposition: A | Discharge status: Home / Self Care | Payer: BC Managed Care – PPO | Attending: Internal Medicine | Admitting: Internal Medicine

## 2022-09-14 ENCOUNTER — Other Ambulatory Visit: Payer: Self-pay

## 2022-09-14 ENCOUNTER — Observation Stay: Payer: BC Managed Care – PPO

## 2022-09-14 ENCOUNTER — Observation Stay
Admission: EM | Admit: 2022-09-14 | Discharge: 2022-09-14 | Disposition: A | Discharge status: Home / Self Care | Payer: BC Managed Care – PPO | Attending: Osteopathic Medicine | Admitting: Osteopathic Medicine

## 2022-09-14 DIAGNOSIS — E236 Other disorders of pituitary gland: Secondary | ICD-10-CM

## 2022-09-14 DIAGNOSIS — R404 Transient alteration of awareness: Secondary | ICD-10-CM

## 2022-09-14 DIAGNOSIS — I2510 Atherosclerotic heart disease of native coronary artery without angina pectoris: Secondary | ICD-10-CM | POA: Diagnosis present

## 2022-09-14 DIAGNOSIS — Z87891 Personal history of nicotine dependence: Secondary | ICD-10-CM

## 2022-09-14 DIAGNOSIS — F419 Anxiety disorder, unspecified: Secondary | ICD-10-CM | POA: Insufficient documentation

## 2022-09-14 DIAGNOSIS — G459 Transient cerebral ischemic attack, unspecified: Secondary | ICD-10-CM

## 2022-09-14 DIAGNOSIS — I2584 Coronary atherosclerosis due to calcified coronary lesion: Secondary | ICD-10-CM

## 2022-09-14 DIAGNOSIS — G930 Cerebral cysts: Secondary | ICD-10-CM | POA: Diagnosis not present

## 2022-09-14 DIAGNOSIS — G454 Transient global amnesia: Secondary | ICD-10-CM

## 2022-09-14 DIAGNOSIS — G4733 Obstructive sleep apnea (adult) (pediatric): Secondary | ICD-10-CM

## 2022-09-14 DIAGNOSIS — G43909 Migraine, unspecified, not intractable, without status migrainosus: Secondary | ICD-10-CM

## 2022-09-14 DIAGNOSIS — R4182 Altered mental status, unspecified: Secondary | ICD-10-CM | POA: Diagnosis not present

## 2022-09-14 DIAGNOSIS — E237 Disorder of pituitary gland, unspecified: Secondary | ICD-10-CM

## 2022-09-14 DIAGNOSIS — E785 Hyperlipidemia, unspecified: Secondary | ICD-10-CM | POA: Diagnosis present

## 2022-09-14 DIAGNOSIS — R29818 Other symptoms and signs involving the nervous system: Secondary | ICD-10-CM | POA: Diagnosis not present

## 2022-09-14 LAB — LIPID PANEL
Cholesterol: 141 mg/dL (ref 0–200)
HDL: 55 mg/dL (ref >40)
LDL Cholesterol: 74 mg/dL (ref 0–99)
Total CHOL/HDL Ratio: 2.6 RATIO
Triglycerides: 61 mg/dL (ref <150)
VLDL: 12 mg/dL (ref 0–40)

## 2022-09-14 LAB — ECHOCARDIOGRAM COMPLETE
AR max vel: 3.03 cm2
AV Area VTI: 3.08 cm2
AV Area mean vel: 2.87 cm2
AV Mean grad: 3 mmHg
AV Peak grad: 5.2 mmHg
Ao pk vel: 1.14 m/s
Area-P 1/2: 2.56 cm2
MV VTI: 3.45 cm2
S' Lateral: 2.6 cm

## 2022-09-14 LAB — CBC
HCT: 45.9 % (ref 36.0–46.0)
Hemoglobin: 14.9 g/dL (ref 12.0–15.0)
MCH: 32.5 pg (ref 26.0–34.0)
MCHC: 32.5 g/dL (ref 30.0–36.0)
MCV: 100 fL (ref 80.0–100.0)
Platelets: 256 10*3/uL (ref 150–400)
RBC: 4.59 MIL/uL (ref 3.87–5.11)
RDW: 13.2 % (ref 11.5–15.5)
WBC: 8.5 10*3/uL (ref 4.0–10.5)
nRBC: 0 % (ref 0.0–0.2)

## 2022-09-14 LAB — CREATININE, SERUM
Creatinine, Ser: 0.72 mg/dL (ref 0.44–1.00)
GFR, Estimated: >60 mL/min (ref >60)

## 2022-09-14 LAB — HIV ANTIBODY (ROUTINE TESTING W REFLEX): HIV Screen 4th Generation wRfx: NONREACTIVE

## 2022-09-14 LAB — TSH: TSH: 6.81 u[IU]/mL — ABNORMAL HIGH (ref 0.350–4.500)

## 2022-09-14 LAB — T4, FREE: Free T4: 0.84 ng/dL (ref 0.61–1.12)

## 2022-09-14 MED ORDER — ACETAMINOPHEN 325 MG PO TABS: 650.0000 mg | ORAL_TABLET | ORAL | Status: DC | PRN

## 2022-09-14 MED ORDER — SODIUM CHLORIDE 0.9 % IV BOLUS (SEPSIS)
500.0000 mL | Freq: Once | INTRAVENOUS | Status: AC
  Administered 2022-09-14: 500 mL via INTRAVENOUS

## 2022-09-14 MED ORDER — METOCLOPRAMIDE HCL 5 MG/ML IJ SOLN
5.0000 mg | Freq: Once | INTRAMUSCULAR | Status: AC
  Administered 2022-09-14: 5 mg via INTRAVENOUS
  Filled 2022-09-14: qty 2

## 2022-09-14 MED ORDER — ACETAMINOPHEN 325 MG RE SUPP: 650.0000 mg | RECTAL | Status: DC | PRN

## 2022-09-14 MED ORDER — ENOXAPARIN SODIUM 60 MG/0.6ML IJ SOSY
0.5000 mg/kg | PREFILLED_SYRINGE | INTRAMUSCULAR | Status: DC
  Filled 2022-09-14: qty 0.6

## 2022-09-14 MED ORDER — KETOROLAC TROMETHAMINE 30 MG/ML IJ SOLN
15.0000 mg | Freq: Once | INTRAMUSCULAR | Status: AC
  Administered 2022-09-14: 15 mg via INTRAVENOUS
  Filled 2022-09-14: qty 1

## 2022-09-14 MED ORDER — TRAZODONE HCL 50 MG PO TABS: 50.0000 mg | ORAL_TABLET | Freq: Every evening | ORAL | Status: DC | PRN

## 2022-09-14 MED ORDER — ATORVASTATIN CALCIUM 20 MG PO TABS
20.0000 mg | ORAL_TABLET | Freq: Every day | ORAL | Status: DC
  Administered 2022-09-14: 20 mg via ORAL
  Filled 2022-09-14: qty 1

## 2022-09-14 MED ORDER — ASPIRIN 81 MG PO TBEC
81.0000 mg | DELAYED_RELEASE_TABLET | Freq: Every day | ORAL | Status: DC
  Administered 2022-09-14: 81 mg via ORAL
  Filled 2022-09-14: qty 1

## 2022-09-14 MED ORDER — ACETAMINOPHEN 160 MG/5ML PO SOLN: 650.0000 mg | ORAL | Status: DC | PRN

## 2022-09-14 MED ORDER — CLONAZEPAM 0.5 MG PO TABS: 0.5000 mg | ORAL_TABLET | Freq: Every day | ORAL | Status: DC | PRN

## 2022-09-14 MED ORDER — STROKE: EARLY STAGES OF RECOVERY BOOK: Freq: Once | Status: DC

## 2022-09-14 NOTE — Evaluation (Signed)
Occupational Therapy Evaluation Patient Details Name: Kathy Gregory MRN: [id number] DOB: 01/30/1951 Today's Date: 09/14/2022   History of Present Illness 72 y.o. female with history of CAD, diabetes, hyperlipidemia, migraines who presents to the emergency department with memory loss.  Daughter states this started around 4:45 PM.  Patient went to work and picked up pizza on the way home but did not remember picking up pizza and did not remember that the pizza was in her trunk.  Daughter states she has also had some amnesia of events that have happened years ago.  She states the symptoms are improving.  Patient now complaining of headache.   Clinical Impression   Upon entering the room, pt is supine in bed and agreeable to OT evaluation. Pt reports living at home alone and working full time with insurance company. Pt drives and performs all IADLs without assistance and independently at baseline. Pt dons B socks and exits ED stretcher without assistance. Pt ambulating in ED without use of AD independently and endorses feeling at her baseline at this time. Friend present in room and confirms. Pt does not need skilled OT intervention at this time and agrees. OT to complete order. No follow up needed.      Recommendations for follow up therapy are one component of a multi-disciplinary discharge planning process, led by the attending physician.  Recommendations may be updated based on patient status, additional functional criteria and insurance authorization.   Follow Up Recommendations  No OT follow up     Assistance Recommended at Discharge None     Functional Status Assessment  Patient has not had a recent decline in their functional status  Equipment Recommendations  None recommended by OT       Precautions / Restrictions Precautions Precautions: Fall      Mobility Bed Mobility Overal bed mobility: Independent                  Transfers Overall transfer level:  Independent Equipment used: None                      Balance Overall balance assessment: Independent                                         ADL either performed or assessed with clinical judgement   ADL Overall ADL's : At baseline;Independent                                             Vision Patient Visual Report: No change from baseline              Pertinent Vitals/Pain Pain Assessment Pain Assessment: No/denies pain     Hand Dominance Right   Extremity/Trunk Assessment Upper Extremity Assessment Upper Extremity Assessment: Overall WFL for tasks assessed   Lower Extremity Assessment Lower Extremity Assessment: Overall WFL for tasks assessed       Communication Communication Communication: No difficulties   Cognition Arousal/Alertness: Awake/alert Behavior During Therapy: WFL for tasks assessed/performed Overall Cognitive Status: Within Functional Limits for tasks assessed  Home Living Family/patient expects to be discharged to:: Private residence Living Arrangements: Alone Available Help at Discharge: Friend(s);Family;Available PRN/intermittently Type of Home: House Home Access: Stairs to enter [employer] of Steps: 3-4 Entrance Stairs-Rails: Left;Right Home Layout: One level               Home Equipment: None          Prior Functioning/Environment Prior Level of Function : Independent/Modified Independent;Working/employed;Driving                                 OT Goals(Current goals can be found in the care plan section) Acute Rehab OT Goals Patient Stated Goal: to go home OT Goal Formulation: With patient Time For Goal Achievement: 09/14/22 Potential to Achieve Goals: Good  OT Frequency:         AM-PAC OT "6 Clicks" Daily Activity     Outcome Measure Help from another person eating meals?:  None Help from another person taking care of personal grooming?: None Help from another person toileting, which includes using toliet, bedpan, or urinal?: None Help from another person bathing (including washing, rinsing, drying)?: None Help from another person to put on and taking off regular upper body clothing?: None Help from another person to put on and taking off regular lower body clothing?: None 6 Click Score: 24   End of Session    Activity Tolerance: Patient tolerated treatment well Patient left: in bed;with call bell/phone within reach;with family/visitor present                   Time: 6789-3810 OT Time Calculation (min): 11 min Charges:  OT General Charges $OT Visit: 1 Visit OT Evaluation $OT Eval Low Complexity: 1 Low  Darleen Crocker, MS, OTR/L , CBIS ascom [phone]  09/14/22, 12:44 PM

## 2022-09-14 NOTE — ED Notes (Signed)
ED Provider at bedside. 

## 2022-09-14 NOTE — Progress Notes (Signed)
*  PRELIMINARY RESULTS* Echocardiogram 2D Echocardiogram has been performed.  Kathy Gregory 09/14/2022, 9:44 AM

## 2022-09-14 NOTE — ED Provider Notes (Signed)
[facility] Provider Note    Event Date/Time   First MD Initiated Contact with Patient 09/14/22 [phone]     (approximate)   History   Memory Loss   HPI  TORIANNA JUNIO is a 72 y.o. female with history of CAD, diabetes, hyperlipidemia, migraines who presents to the emergency department with memory loss.  Daughter states this started around 4:45 PM.  Patient went to work and picked up pizza on the way home but did not remember picking up pizza and did not remember that the pizza was in her trunk.  Daughter states she has also had some amnesia of events that have happened years ago.  She states the symptoms are improving.  Patient now complaining of headache.  No numbness, tingling, focal weakness, chest pain, shortness of breath, fever.  Has never had similar symptoms.   History provided by patient, daughter.    Past Medical History:  Diagnosis Date   Anxiety    Arthritis    hips, collarbone   Coronary artery calcification    Depression    Diabetes ([place])    Heart murmur 07/24/2016   Hyperlipidemia    Migraines    4-5 per year   Other fatigue 12/29/2017   Scoliosis    mild   Sleep apnea    Vertigo    2-3x/yr    Past Surgical History:  Procedure Laterality Date   BREAST CYST ASPIRATION Left    COLONOSCOPY  2006   COLONOSCOPY WITH PROPOFOL N/A 05/15/2015   Procedure: COLONOSCOPY WITH PROPOFOL;  Surgeon: Lucilla Lame, MD;  Location: [place];  Service: Endoscopy;  Laterality: N/A;   COLONOSCOPY WITH PROPOFOL N/A 06/02/2020   Procedure: COLONOSCOPY WITH PROPOFOL;  Surgeon: Lucilla Lame, MD;  Location: [facility] ENDOSCOPY;  Service: Endoscopy;  Laterality: N/A;   EYE SURGERY     POLYPECTOMY  05/15/2015   Procedure: POLYPECTOMY;  Surgeon: Lucilla Lame, MD;  Location: [employer] [employer];  Service: Endoscopy;;    MEDICATIONS:  Prior to Admission medications   Medication Sig Start Date End Date Taking? Authorizing Provider  aspirin EC 81 MG  tablet Take 1 tablet (81 mg total) by mouth daily. 07/26/16   End, Harrell Gave, MD  atorvastatin (LIPITOR) 20 MG tablet TAKE 1 TABLET BY MOUTH EVERYDAY AT BEDTIME 05/16/22   Delsa Grana, PA-C  blood glucose meter kit and supplies KIT Dispense based on patient and insurance preference. Use up to four times daily as directed. (FOR ICD-9 250.00, 250.01). 11/15/21   Delsa Grana, PA-C  clonazePAM (KLONOPIN) 0.5 MG tablet Take 0.5-1 tablets (0.25-0.5 mg total) by mouth daily as needed for anxiety. Never take within six hours of alcohol 05/16/22   Delsa Grana, PA-C  CONTOUR NEXT TEST test strip USE UP TO 4 TIMES DAILY AS DIRECTED 06/17/22   Delsa Grana, PA-C  Fish Oil-Cholecalciferol (FISH OIL + D3) 1000-1000 MG-UNIT CAPS Take by mouth daily.     [provider]  Microlet Lancets MISC USE UP TO 4 TIMES DAILY AS DIRECTED 06/20/22   Delsa Grana, PA-C  Multiple Minerals-Vitamins (CALCIUM & VIT D3 BONE HEALTH PO) Take 2 tablets by mouth daily. Calcium '600mg'$ - Vitamin D '400mg'$     [provider]  psyllium (REGULOID) 0.52 G capsule Take 0.52 g by mouth daily.    [provider]  traZODone (DESYREL) 50 MG tablet TAKE 1-2 TABLETS BY MOUTH AT BEDTIME AS NEEDED FOR SLEEP. 08/04/22   Delsa Grana, PA-C    Physical Exam  Triage Vital Signs: ED Triage Vitals  Enc Vitals Group     BP 09/13/22 1941 (!) 142/65     Pulse Rate 09/13/22 1941 99     Resp 09/13/22 1941 18     Temp 09/13/22 1941 98.2 F (36.8 C)     Temp Source 09/13/22 1941 Oral     SpO2 09/13/22 1941 93 %     Weight 09/13/22 1939 220 lb (99.8 kg)     Height 09/13/22 1939 '5\' 8"'$  (1.727 m)     Head Circumference --      Peak Flow --      Pain Score 09/13/22 1939 0     Pain Loc --      Pain Edu? --      Excl. in [employer]? --     Most recent vital signs: Vitals:   09/13/22 1941 09/13/22 2248  BP: (!) 142/65 127/84  Pulse: 99 90  Resp: 18 18  Temp: 98.2 F (36.8 C) 98.1 F (36.7 C)  SpO2: 93% 97%    CONSTITUTIONAL:  Alert, oriented x 3.  Responds appropriately to questions. Well-appearing; well-nourished HEAD: Normocephalic, atraumatic EYES: Conjunctivae clear, pupils appear equal, sclera nonicteric ENT: normal nose; moist mucous membranes NECK: Supple, normal ROM CARD: RRR; S1 and S2 appreciated RESP: Normal chest excursion without splinting or tachypnea; breath sounds clear and equal bilaterally; no wheezes, no rhonchi, no rales, no hypoxia or respiratory distress, speaking full sentences ABD/GI: Non-distended; soft, non-tender, no rebound, no guarding, no peritoneal signs BACK: The back appears normal EXT: Normal ROM in all joints; no deformity noted, no edema SKIN: Normal color for age and race; warm; no rash on exposed skin NEURO: Moves all extremities equally, normal speech, normal sensation, no facial asymmetry PSYCH: The patient's mood and manner are appropriate.   ED Results / Procedures / Treatments   LABS: (all labs ordered are listed, but only abnormal results are displayed) Labs Reviewed  BASIC METABOLIC PANEL - Abnormal; Notable for the following components:      Result Value   Glucose, Bld 103 (*)    BUN 26 (*)    All other components within normal limits  CBC - Abnormal; Notable for the following components:   Hemoglobin 16.0 (*)    HCT 47.9 (*)    All other components within normal limits  URINALYSIS, ROUTINE W REFLEX MICROSCOPIC - Abnormal; Notable for the following components:   Color, Urine COLORLESS (*)    APPearance CLEAR (*)    Specific Gravity, Urine 1.003 (*)    Hgb urine dipstick SMALL (*)    Leukocytes,Ua TRACE (*)    Bacteria, UA RARE (*)    All other components within normal limits  TROPONIN I (HIGH SENSITIVITY)  TROPONIN I (HIGH SENSITIVITY)     EKG:  EKG Interpretation  Date/Time:    Ventricular Rate:    PR Interval:    QRS Duration:   QT Interval:    QTC Calculation:   R Axis:     Text Interpretation:           RADIOLOGY: My personal  review and interpretation of imaging: CT head and MRI brain showed no acute abnormality.  I have personally reviewed all radiology reports.   MR BRAIN WO CONTRAST  Result Date: 09/14/2022 CLINICAL DATA:  Initial evaluation for neuro deficit, stroke suspected. EXAM: MRI HEAD WITHOUT CONTRAST TECHNIQUE: Multiplanar, multiecho pulse sequences of the brain and surrounding structures were obtained without intravenous contrast. COMPARISON:  Prior CT from  09/13/2022. FINDINGS: Brain: Cerebral volume within normal limits. No focal parenchymal signal abnormality or significant cerebral white matter disease for age. No evidence for acute or subacute ischemia. Gray-white matter differentiation maintained. No areas of chronic cortical infarction. No acute or chronic intracranial blood products. No mass lesion, midline shift or mass effect. No hydrocephalus or extra-axial fluid collection. 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 Mild suprasellar extension without mass effect on the optic chiasm. Vascular: Major intracranial vascular flow voids are maintained. Skull and upper cervical spine: Craniocervical junction within normal limits. Bone marrow signal intensity normal. No scalp soft tissue abnormality. Sinuses/Orbits: Prior bilateral ocular lens replacement. Scattered mucosal thickening noted about the sphenoethmoidal sinuses. Paranasal sinuses are otherwise largely clear. No mastoid effusion. Other: None. IMPRESSION: 1. No acute intracranial abnormality. 2. 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 Correlation with endocrine function tests recommended. Follow-up pituitary protocol MRI with and without contrast should be performed in 6-12 months. This follows ACR consensus guidelines: Management of Incidental Pituitary  Findings on CT, MRI and F18-FDG PET: A White Paper of the ACR Incidental Findings Committee. J Am Coll Radiol 2018; 15: 237-62. 3. Otherwise normal brain MRI for age. Electronically Signed   By: Jeannine Boga M.D.   On: 09/14/2022 02:55   CT Head Wo Contrast  Result Date: 09/13/2022 CLINICAL DATA:  [place] loss and altered mental status. EXAM: CT HEAD WITHOUT CONTRAST TECHNIQUE: Contiguous axial images were obtained from the base of the skull through the vertex without intravenous contrast. RADIATION DOSE REDUCTION: This exam was performed according to the departmental dose-optimization program which includes automated exposure control, adjustment of the mA and/or kV according to patient size and/or use of iterative reconstruction technique. COMPARISON:  None Available. FINDINGS: Brain: There is mild cerebral atrophy with widening of the extra-axial spaces and ventricular dilatation. There are areas of decreased attenuation within the white matter tracts of the supratentorial brain, consistent with microvascular disease changes. Vascular: No hyperdense vessel or unexpected calcification. Skull: Normal. Negative for fracture or focal lesion. Sinuses/Orbits: No acute finding. Other: None. IMPRESSION: 1. No acute intracranial abnormality. 2. Generalized cerebral atrophy and microvascular disease changes of the supratentorial brain. Electronically Signed   By: Virgina Norfolk M.D.   On: 09/13/2022 20:23     PROCEDURES:  Critical Care performed: No     .1-3 Lead EKG Interpretation  Performed by: Sharlena Kristensen, Delice Bison, DO Authorized by: Murvin Gift, Delice Bison, DO     Interpretation: normal     ECG rate:  90   ECG rate assessment: normal     Rhythm: sinus rhythm     Ectopy: none     Conduction: normal       IMPRESSION / MDM / ASSESSMENT AND PLAN / ED COURSE  I reviewed the triage vital signs and the nursing notes.    Patient here with episode of retrograde and antegrade amnesia that seems to be  improving.  No other focal neurodeficits.  Complaining of diffuse headache.  The patient is on the cardiac monitor to evaluate for evidence of arrhythmia and/or significant heart rate changes.   DIFFERENTIAL DIAGNOSIS (includes but not limited to):   Suspect transient global amnesia possibly caused by migraine, stress.  Low suspicion for CVA, mass, intracranial hemorrhage, TIA, seizure.  UTI also on the differential.  Patient's presentation is most consistent with acute presentation with potential threat to life or bodily function.   PLAN: Workup started from triage.  No leukocytosis, normal electrolytes and renal function.  Troponin x 2 negative.  Urine shows no sign of infection.  CT head reviewed and interpreted by myself and the radiologist and shows no acute abnormality.  Will give migraine cocktail and obtain MRI of the brain.  Daughter reports she is improving but does not seem to be back to baseline yet.  She is starting to remember previous events that happened years ago and is oriented x 3 currently.  Discussed with patient and family that I would like for her to go back to baseline prior to discharge home if MRI is unremarkable.   MEDICATIONS GIVEN IN ED: Medications  ketorolac (TORADOL) 30 MG/ML injection 15 mg (15 mg Intravenous Given 09/14/22 0127)  metoCLOPramide (REGLAN) injection 5 mg (5 mg Intravenous Given 09/14/22 0127)  sodium chloride 0.9 % bolus 500 mL (500 mLs Intravenous New Bag/Given 09/14/22 0127)     ED COURSE: MRI reviewed and interpreted by myself and the radiologist and shows no acute abnormality.  Does show small pituitary cyst that can be followed as an outpatient.  Discussed this with patient and daughter.  Patient is still having a hard time remembering events that have occurred in the ED and does not remember meeting or talking to me.  Given she is still having anterograde amnesia, have recommended admission for observation.  She may need to see neurology in  the morning if symptoms not improving.  Headache improved with migraine cocktail.   CONSULTS:  Consulted and discussed patient's case with hospitalist, Dr. Damita Dunnings.  I have recommended admission and consulting physician agrees and will place admission orders.  Patient (and family if present) agree with this plan.   I reviewed all nursing notes, vitals, pertinent previous records.  All labs, EKGs, imaging ordered have been independently reviewed and interpreted by myself.    OUTSIDE RECORDS REVIEWED: Reviewed patient's last office visit with internal medicine on 05/16/2022.       FINAL CLINICAL IMPRESSION(S) / ED DIAGNOSES   Final diagnoses:  Transient global amnesia     Rx / DC Orders   ED Discharge Orders     None        Note:  This document was prepared using Dragon voice recognition software and may include unintentional dictation errors.   Baraa Tubbs, Delice Bison, DO 09/14/22 [phone]

## 2022-09-14 NOTE — H&P (Signed)
History and Physical    Patient: Kathy Gregory IWL:[id number] DOB: 12-18-1950 DOA: 09/14/2022 DOS: the patient was seen and examined on 09/14/2022 PCP: Delsa Grana, PA-C  Patient coming from: Home  Chief Complaint:  Chief Complaint  Patient presents with   Memory Loss    HPI: Kathy Gregory is a 72 y.o. female with medical history significant for coronary artery calcification, hyperlipidemia, obstructive sleep apnea, tobacco use, arthritis, migraine headaches, and depression/anxiety who was brought to the ED due to concerns for an amnestic episode where she forgot the day's events and asking repetitive questions. No prior history of cognitive deficits.  She has an associated headache .denies visual disturbance, one-sided weakness, numbness or tingling.  Denies falls or recent trauma.  By the time of my assessment, daughter at bedside states that patient had improved but still had little recollection of the days events.  Workup in the ED was unremarkable.  Labs were unremarkable and MRI brain was nonacute and normal for age as detailed below: IMPRESSION: 1. No acute intracranial abnormality. 2. 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 Correlation with endocrine function tests recommended. Follow-up pituitary protocol MRI with and without contrast should be performed in 6-12 months. This follows ACR consensus guidelines: Management of Incidental Pituitary Findings on CT, MRI and F18-FDG PET: A White Paper of the ACR Incidental Findings Committee. J Am Coll Radiol 2018; 15: 174-08. 3. Otherwise normal brain MRI for age.  Patient was treated with a headache cocktail (Toradol and metoclopramide )with improvement in headache but due to  persistent problems with recollection, hospitalist consulted for observation     Past Medical History:  Diagnosis Date   Anxiety    Arthritis    hips, collarbone    Coronary artery calcification    Depression    Diabetes ([place])    Heart murmur 07/24/2016   Hyperlipidemia    Migraines    4-5 per year   Other fatigue 12/29/2017   Scoliosis    mild   Sleep apnea    Vertigo    2-3x/yr   Past Surgical History:  Procedure Laterality Date   BREAST CYST ASPIRATION Left    COLONOSCOPY  2006   COLONOSCOPY WITH PROPOFOL N/A 05/15/2015   Procedure: COLONOSCOPY WITH PROPOFOL;  Surgeon: Lucilla Lame, MD;  Location: [facility];  Service: Endoscopy;  Laterality: N/A;   COLONOSCOPY WITH PROPOFOL N/A 06/02/2020   Procedure: COLONOSCOPY WITH PROPOFOL;  Surgeon: Lucilla Lame, MD;  Location: [facility] END[facility]COPY;  Service: Endoscopy;  Laterality: N/A;   EYE SURGERY     POLYPECTOMY  05/15/2015   Procedure: POLYPECTOMY;  Surgeon: Lucilla Lame, MD;  Location: [place];  Service: Endoscopy;;   Social History:  reports that she has been smoking cigarettes. She has a 23.50 pack-year smoking history. She has never used smokeless tobacco. She reports current alcohol use of about 4.0 standard drinks of alcohol per week. She reports that she does not use drugs.  No Known Allergies  Family History  Problem Relation Age of Onset   Hypertension Mother    Diabetes Mother    Heart disease Father 76       CABG   Stroke Father 45       Occurred during CABG   Diabetes Brother    Hypertension Brother    Atrial fibrillation Brother    Cirrhosis Brother    Kidney disease Maternal Grandmother  Breast cancer Paternal Grandmother     Prior to Admission medications   Medication Sig Start Date End Date Taking? Authorizing Provider  aspirin EC 81 MG tablet Take 1 tablet (81 mg total) by mouth daily. 07/26/16   End, Harrell Gave, MD  atorvastatin (LIPITOR) 20 MG tablet TAKE 1 TABLET BY MOUTH EVERYDAY AT BEDTIME 05/16/22   Delsa Grana, PA-C  blood glucose meter kit and supplies KIT Dispense based on patient and insurance preference. Use up to four times daily as  directed. (FOR ICD-9 250.00, 250.01). 11/15/21   Delsa Grana, PA-C  clonazePAM (KLONOPIN) 0.5 MG tablet Take 0.5-1 tablets (0.25-0.5 mg total) by mouth daily as needed for anxiety. Never take within six hours of alcohol 05/16/22   Delsa Grana, PA-C  CONTOUR NEXT TEST test strip USE UP TO 4 TIMES DAILY AS DIRECTED 06/17/22   Delsa Grana, PA-C  Fish Oil-Cholecalciferol (FISH OIL + D3) 1000-1000 MG-UNIT CAPS Take by mouth daily.     [provider]  Microlet Lancets MISC USE UP TO 4 TIMES DAILY AS DIRECTED 06/20/22   Delsa Grana, PA-C  Multiple Minerals-Vitamins (CALCIUM & VIT D3 BONE HEALTH PO) Take 2 tablets by mouth daily. Calcium '600mg'$ - Vitamin D '400mg'$     [provider]  psyllium (REGULOID) 0.52 G capsule Take 0.52 g by mouth daily.    [provider]  traZODone (DESYREL) 50 MG tablet TAKE 1-2 TABLETS BY MOUTH AT BEDTIME AS NEEDED FOR SLEEP. 08/04/22   Delsa Grana, PA-C    Physical Exam: Vitals:   09/13/22 1939 09/13/22 1941 09/13/22 2248  BP:  (!) 142/65 127/84  Pulse:  99 90  Resp:  18 18  Temp:  98.2 F (36.8 C) 98.1 F (36.7 C)  TempSrc:  Oral Oral  SpO2:  93% 97%  Weight: 99.8 kg    Height: '5\' 8"'$  (1.727 m)     Physical Exam Vitals and nursing note reviewed.  Constitutional:      General: She is not in acute distress. HENT:     Head: Normocephalic and atraumatic.  Cardiovascular:     Rate and Rhythm: Normal rate and regular rhythm.     Heart sounds: Normal heart sounds.  Pulmonary:     Effort: Pulmonary effort is normal.     Breath sounds: Normal breath sounds.  Abdominal:     Palpations: Abdomen is soft.     Tenderness: There is no abdominal tenderness.  Neurological:     Mental Status: Mental status is at baseline.     Labs on Admission: I have personally reviewed following labs and imaging studies  CBC: Recent Labs  Lab 09/13/22 1944  WBC 9.9  HGB 16.0*  HCT 47.9*  MCV 98.6  PLT 604   Basic Metabolic Panel: Recent Labs   Lab 09/13/22 1944  NA 141  K 4.1  CL 108  CO2 24  GLUCOSE 103*  BUN 26*  CREATININE 0.77  CALCIUM 9.8   GFR: Estimated Creatinine Clearance: 80.9 mL/min (by C-G formula based on SCr of 0.77 mg/dL). Liver Function Tests: No results for input(s): "AST", "ALT", "ALKPHOS", "BILITOT", "PROT", "ALBUMIN" in the last 168 hours. No results for input(s): "LIPASE", "AMYLASE" in the last 168 hours. No results for input(s): "AMMONIA" in the last 168 hours. Coagulation Profile: No results for input(s): "INR", "PROTIME" in the last 168 hours. Cardiac Enzymes: No results for input(s): "CKTOTAL", "CKMB", "CKMBINDEX", "TROPONINI" in the last 168 hours. BNP (last 3 results) No results for input(s): "PROBNP" in the  last 8760 hours. HbA1C: No results for input(s): "HGBA1C" in the last 72 hours. CBG: No results for input(s): "GLUCAP" in the last 168 hours. Lipid Profile: No results for input(s): "CHOL", "HDL", "LDLCALC", "TRIG", "CHOLHDL", "LDLDIRECT" in the last 72 hours. Thyroid Function Tests: No results for input(s): "TSH", "T4TOTAL", "FREET4", "T3FREE", "THYROIDAB" in the last 72 hours. Anemia Panel: No results for input(s): "VITAMINB12", "FOLATE", "FERRITIN", "TIBC", "IRON", "RETICCTPCT" in the last 72 hours. Urine analysis:    Component Value Date/Time   COLORURINE COLORLESS (A) 09/13/2022 1944   APPEARANCEUR CLEAR (A) 09/13/2022 1944   LABSPEC 1.003 (L) 09/13/2022 1944   PHURINE 6.0 09/13/2022 1944   GLUCOSEU NEGATIVE 09/13/2022 1944   HGBUR SMALL (A) 09/13/2022 1944   BILIRUBINUR NEGATIVE 09/13/2022 1944   KETONESUR NEGATIVE 09/13/2022 1944   PROTEINUR NEGATIVE 09/13/2022 1944   NITRITE NEGATIVE 09/13/2022 1944   LEUKOCYTESUR TRACE (A) 09/13/2022 1944    Radiological Exams on Admission: MR BRAIN WO CONTRAST  Result Date: 09/14/2022 CLINICAL DATA:  Initial evaluation for neuro deficit, stroke suspected. EXAM: MRI HEAD WITHOUT CONTRAST TECHNIQUE: Multiplanar, multiecho pulse  sequences of the brain and surrounding structures were obtained without intravenous contrast. COMPARISON:  Prior CT from 09/13/2022. FINDINGS: Brain: Cerebral volume within normal limits. No focal parenchymal signal abnormality or significant cerebral white matter disease for age. No evidence for acute or subacute ischemia. Gray-white matter differentiation maintained. No areas of chronic cortical infarction. No acute or chronic intracranial blood products. No mass lesion, midline shift or mass effect. No hydrocephalus or extra-axial fluid collection. 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 Mild suprasellar extension without mass effect on the optic chiasm. Vascular: Major intracranial vascular flow voids are maintained. Skull and upper cervical spine: Craniocervical junction within normal limits. Bone marrow signal intensity normal. No scalp soft tissue abnormality. Sinuses/Orbits: Prior bilateral ocular lens replacement. Scattered mucosal thickening noted about the sphenoethmoidal sinuses. Paranasal sinuses are otherwise largely clear. No mastoid effusion. Other: None. IMPRESSION: 1. No acute intracranial abnormality. 2. 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 Correlation with endocrine function tests recommended. Follow-up pituitary protocol MRI with and without contrast should be performed in 6-12 months. This follows ACR consensus guidelines: Management of Incidental Pituitary Findings on CT, MRI and F18-FDG PET: A White Paper of the ACR Incidental Findings Committee. J Am Coll Radiol 2018; 15: 683-41. 3. Otherwise normal brain MRI for age. Electronically Signed   By: Jeannine Boga M.D.   On: 09/14/2022 02:55   CT Head Wo Contrast  Result Date: 09/13/2022 CLINICAL DATA:  [place] loss and altered  mental status. EXAM: CT HEAD WITHOUT CONTRAST TECHNIQUE: Contiguous axial images were obtained from the base of the skull through the vertex without intravenous contrast. RADIATION DOSE REDUCTION: This exam was performed according to the departmental dose-optimization program which includes automated exposure control, adjustment of the mA and/or kV according to patient size and/or use of iterative reconstruction technique. COMPARISON:  None Available. FINDINGS: Brain: There is mild cerebral atrophy with widening of the extra-axial spaces and ventricular dilatation. There are areas of decreased attenuation within the white matter tracts of the supratentorial brain, consistent with microvascular disease changes. Vascular: No hyperdense vessel or unexpected calcification. Skull: Normal. Negative for fracture or focal lesion. Sinuses/Orbits: No acute finding. Other: None. IMPRESSION: 1. No acute intracranial abnormality.  2. Generalized cerebral atrophy and microvascular disease changes of the supratentorial brain. Electronically Signed   By: Virgina Norfolk M.D.   On: 09/13/2022 20:23     Data Reviewed: Relevant notes from primary care and specialist visits, past discharge summaries as available in EHR, including Care Everywhere. Prior diagnostic testing as pertinent to current admission diagnoses Updated medications and problem lists for reconciliation ED course, including vitals, labs, imaging, treatment and response to treatment Triage notes, nursing and pharmacy notes and ED provider's notes Notable results as noted in HPI   Assessment and Plan: Amnesia, global, transient Patient with both retrograde and antegrade amnesia, improving since arrival MRI brain nonacute but does show pituitary cyst with recommendation for endocrine workup TIA workup ordered as well as EEG Neurology consult Neurochecks  Migraine Had a headache in the ED, now resolved  Pituitary cyst ([place]) MRI brain showing  pituitary cyst Endocrine workup recommended We will get TSH and prolactin Outpatient endocrinology referral  Anxiety Continue trazodone and Klonopin  OSA on CPAP CPAP nightly  Coronary artery calcification Continue aspirin and atorvastatin Patient follows with Dr. Saunders Revel  Personal history of tobacco use, presenting hazards to health Nicotine patch  Hyperlipidemia LDL goal <70 Continue atorvastatin        DVT prophylaxis: Lovenox  Consults: Neurologist, Dr. Leonel Ramsay  Advance Care Planning: full code  Family Communication: daughter at bedside  Disposition Plan: Back to previous home environment  Severity of Illness: The appropriate patient status for this patient is OBSERVATION. Observation status is judged to be reasonable and necessary in order to provide the required intensity of service to ensure the patient's safety. The patient's presenting symptoms, physical exam findings, and initial radiographic and laboratory data in the context of their medical condition is felt to place them at decreased risk for further clinical deterioration. Furthermore, it is anticipated that the patient will be medically stable for discharge from the hospital within 2 midnights of admission.   Author: Athena Masse, MD 09/14/2022 3:46 AM  For on call review [email or web address].

## 2022-09-14 NOTE — Assessment & Plan Note (Signed)
CPAP nightly

## 2022-09-14 NOTE — Progress Notes (Signed)
PT Cancellation Note  Patient Details Name: Kathy Gregory MRN: [id number] DOB: [birth year]   Cancelled Treatment:    Reason Eval/Treat Not Completed: PT screened, no needs identified, will sign off (Consult received & patient discussed with care team.  Per OT, patient is at baseline level of ability, no acute PT needs.  Additionally, declines formal assessment/intervention from PT services. Will complete order; please re-consult should needs change)   Irine Heminger H. Owens Shark, PT, DPT, NCS 09/14/22, 9:58 AM [phone]

## 2022-09-14 NOTE — Assessment & Plan Note (Signed)
Had a headache in the ED, now resolved

## 2022-09-14 NOTE — Assessment & Plan Note (Addendum)
Patient with both retrograde and antegrade amnesia, improving since arrival MRI brain nonacute but does show pituitary cyst with recommendation for endocrine workup TIA workup ordered as well as EEG Neurology consult Neurochecks

## 2022-09-14 NOTE — ED Notes (Signed)
Pt ambulated to the bathroom with a steady gait prior to discharge.

## 2022-09-14 NOTE — Assessment & Plan Note (Addendum)
MRI brain showing pituitary cyst Endocrine workup recommended We will get TSH and prolactin Outpatient endocrinology referral Correlation with endocrine function tests recommended.  Follow-up pituitary protocol MRI with and without contrast should be performed in 6-12 months 02/2023 or 08/2023

## 2022-09-14 NOTE — Discharge Instructions (Signed)
MRI IMPRESSION:  1. No acute intracranial abnormality.  2. 1.3 cm cystic lesion within the left aspect of the pituitary  gland, indeterminate, but favored to reflect a Rathke's cleft cyst.  Correlation with endocrine function tests recommended. Follow-up  pituitary protocol MRI with and without contrast should be performed  in 6-12 months. This follows ACR consensus guidelines: Management of  Incidental Pituitary Findings on CT, MRI and F18-FDG PET: A White  Paper of the ACR Incidental Findings Committee. J Am Coll Radiol  2018; 15: 614-70.  3. Otherwise normal brain MRI for age.

## 2022-09-14 NOTE — Assessment & Plan Note (Signed)
Continue atorvastatin

## 2022-09-14 NOTE — Procedures (Signed)
History: 72 yo F with transient amnestic episode  Sedation: none  Technique: This EEG was acquired with electrodes placed according to the International 10-20 electrode system (including Fp1, Fp2, F3, F4, C3, C4, P3, P4, O1, O2, T3, T4, T5, T6, A1, A2, Fz, Cz, Pz). The following electrodes were missing or displaced: none.   Background: The background consists of intermixed alpha and beta activities. There is a well defined posterior dominant rhythm of 8-9 Hz that attenuates with eye opening. With drowsiness, there is an increase in slow activity. Sleep is briefly recorded with normal appearing structures.   Photic stimulation: Physiologic driving is not performed  EEG Abnormalities: None  Clinical Interpretation: This normal EEG is recorded in the waking and sleep state. There was no seizure or seizure predisposition recorded on this study. Please note that lack of epileptiform activity on EEG does not preclude the possibility of epilepsy.   Roland Rack, MD [facility] [phone]  If 7pm- 7am, please page neurology on call as listed in [place].

## 2022-09-14 NOTE — ED Notes (Signed)
Pt to MRI

## 2022-09-14 NOTE — Progress Notes (Signed)
PHARMACIST - PHYSICIAN COMMUNICATION  CONCERNING:  Enoxaparin (Lovenox) for DVT Prophylaxis    RECOMMENDATION: Patient was prescribed enoxaprin '40mg'$  q24 hours for VTE prophylaxis.   Filed Weights   09/13/22 1939  Weight: 99.8 kg (220 lb)    Body mass index is 33.45 kg/m.  Estimated Creatinine Clearance: 80.9 mL/min (by C-G formula based on SCr of 0.77 mg/dL).   Based on [place] patient is candidate for enoxaparin 0.'5mg'$ /kg TBW SQ every 24 hours based on BMI being >30.  DESCRIPTION: Pharmacy has adjusted enoxaparin dose per [facility] policy.  Patient is now receiving enoxaparin 0.5 mg/kg every 24 hours   Renda Rolls, PharmD, [facility] 09/14/2022 4:14 AM

## 2022-09-14 NOTE — Hospital Course (Addendum)
Kathy Gregory is a 72 y.o. female with medical history significant for coronary artery calcification, hyperlipidemia, obstructive sleep apnea, tobacco use, arthritis, migraine headaches, and depression/anxiety who was brought to the ED evening 09/13/22 due to concerns for an amnestic episode where she forgot the day's events and asking repetitive questions. No prior history of cognitive deficits.  She had an associated headache Workup in the ED was unremarkable.  Labs were unremarkable and MRI brain was nonacute and normal for age. Patient was treated with a headache cocktail (Toradol and metoclopramide) with improvement in headache but due to persistent problems with recollection, hospitalist consulted for observation.  Admitted to hospitalist service under observation status. Per admitting hospitalist, daughter stated that patient had improved but still had little recollection of the days events.  Neurology consulted and cleared patient for discharge   Consultants:  Neurology   Procedures: EEG 09/14/22 no concerns       ASSESSMENT & PLAN:   Active Problems:   Amnesia, global, transient   Migraine   Pituitary cyst ([place])   Hyperlipidemia LDL goal <70   Personal history of tobacco use, presenting hazards to health   Coronary artery calcification   OSA on CPAP   Anxiety  Amnesia, global, transient Patient with both retrograde and antegrade amnesia, improving since arrival in hospital MRI brain nonacute but does show pituitary cyst with recommendation for endocrine workup  Possible TIA: ABCD2 score <4 --> single agent antiplatelet  TIA w/u:  Echo *** Carotid US Less than 50% stenosis of both internal carotid arteries, no hemodynamically significant plaques. EEG WNL Neurology consulted and cleared patient for discharge   Pituitary Cyst on MRI - incidental finding TSH --> 6.810, added T4/T3 labs --> Free T4 WNL PRL in process  Pituitary/endocrine w/u can be done outpatient    Migraine Had a headache in the ED, now resolved  Personal history of tobacco use, presenting hazards to health Nicotine patch  Anxiety Continue trazodone and Klonopin  OSA on CPAP CPAP nightly  Hyperlipidemia LDL goal <70 Continue atorvastatin  Coronary artery calcification Continue aspirin and atorvastatin Patient follows with Dr. Saunders Revel    DVT prophylaxis: *** Pertinent IV fluids/nutrition: *** Central lines / invasive devices: ***  Code Status: ***  Current Admission Status: ***  TOC needs / Dispo plan: *** Barriers to discharge / significant pending items: ***

## 2022-09-14 NOTE — Consult Note (Addendum)
Neurology Consultation Reason for Consult: Memory difficulty Referring Physician: Prudy Feeler  CC: Memory difficulty  History is obtained from: Patient, family member  HPI: Kathy Gregory is a 72 y.o. female with a history of migraine who presents with abrupt onset of memory difficulties that started yesterday afternoon.  She remembers getting up and going to work, but at some point during the day her memories cease.  Yesterday, she called her daughter on the phone stating that she was not sure where she was and then described her location over the phone to her daughter who talked her back to her job site and met her and then drove her to the emergency department.  While in the emergency department, she asked the same questions over and over again, repeatedly looking at her phone and seeing that she had a text about pizza, and asking if someone had gotten it out of her trunk.  This was repeated to the point that they had to delete the text so that she would stop asking about it.  During the episode, she had some retrograde amnesia as well, forgetting that her daughter had recently had surgery.  She is now back to baseline, her memories antecedent to the event have returned, but she does not remember much of yesterday.  She is forming new memories now and is back to baseline.  Her sister states that she was clear during the episode, just was not remembering things.  Past Medical History:  Diagnosis Date   Anxiety    Arthritis    hips, collarbone   Coronary artery calcification    Depression    Diabetes (HCC)    Heart murmur 07/24/2016   Hyperlipidemia    Migraines    4-5 per year   Other fatigue 12/29/2017   Scoliosis    mild   Sleep apnea    Vertigo    2-3x/yr     Family History  Problem Relation Age of Onset   Hypertension Mother    Diabetes Mother    Heart disease Father 26       CABG   Stroke Father 61       Occurred during CABG   Diabetes Brother    Hypertension  Brother    Atrial fibrillation Brother    Cirrhosis Brother    Kidney disease Maternal Grandmother    Breast cancer Paternal Grandmother      Social History:  reports that she has been smoking cigarettes. She has a 23.50 pack-year smoking history. She has never used smokeless tobacco. She reports current alcohol use of about 4.0 standard drinks of alcohol per week. She reports that she does not use drugs.   Exam: Current vital signs: BP 124/63   Pulse 66   Temp 98.4 F (36.9 C) (Oral)   Resp 18   Ht '5\' 8"'$  (1.727 m)   Wt 99.8 kg   SpO2 95%   BMI 33.45 kg/m  Vital signs in last 24 hours: Temp:  [98.1 F (36.7 C)-98.4 F (36.9 C)] 98.4 F (36.9 C) (01/24 0735) Pulse Rate:  [66-99] 66 (01/24 0800) Resp:  [16-18] 18 (01/24 0800) BP: (118-142)/(63-89) 124/63 (01/24 0800) SpO2:  [93 %-97 %] 95 % (01/24 0800) Weight:  [99.8 kg] 99.8 kg (01/23 1939)   Physical Exam  Appears well-developed and well-nourished.   Neuro: Mental Status: Patient is awake, alert, oriented to person, place, month, year, and situation. Patient is able to give a clear and coherent history. No signs of  aphasia or neglect Cranial Nerves: II: Visual Fields are full. Pupils are equal, round, and reactive to light.   III,IV, VI: EOMI without ptosis or diploplia.  V: Facial sensation is symmetric to temperature VII: Facial movement is mildly asymmetric, lower on right than left.  VIII: hearing is intact to voice X: Uvula elevates symmetrically XI: Shoulder shrug is symmetric. XII: tongue is midline without atrophy or fasciculations.  Motor: Tone is normal. Bulk is normal. 5/5 strength was present in all four extremities.  Sensory: Sensation is symmetric to light touch and temperature in the arms and legs. Cerebellar: FNF and HKS are intact bilaterally   I have reviewed labs in epic and the results pertinent to this consultation are: Results for orders placed or performed during the hospital  encounter of 09/14/22 (from the past 24 hour(s))  Basic metabolic panel     Status: Abnormal   Collection Time: 09/13/22  7:44 PM  Result Value Ref Range   Sodium 141 135 - 145 mmol/L   Potassium 4.1 3.5 - 5.1 mmol/L   Chloride 108 98 - 111 mmol/L   CO2 24 22 - 32 mmol/L   Glucose, Bld 103 (H) 70 - 99 mg/dL   BUN 26 (H) 8 - 23 mg/dL   Creatinine, Ser 0.77 0.44 - 1.00 mg/dL   Calcium 9.8 8.9 - 10.3 mg/dL   GFR, Estimated >60 >60 mL/min   Anion gap 9 5 - 15  CBC     Status: Abnormal   Collection Time: 09/13/22  7:44 PM  Result Value Ref Range   WBC 9.9 4.0 - 10.5 K/uL   RBC 4.86 3.87 - 5.11 MIL/uL   Hemoglobin 16.0 (H) 12.0 - 15.0 g/dL   HCT 47.9 (H) 36.0 - 46.0 %   MCV 98.6 80.0 - 100.0 fL   MCH 32.9 26.0 - 34.0 pg   MCHC 33.4 30.0 - 36.0 g/dL   RDW 13.2 11.5 - 15.5 %   Platelets 275 150 - 400 K/uL   nRBC 0.0 0.0 - 0.2 %  Urinalysis, Routine w reflex microscopic Urine, Clean Catch     Status: Abnormal   Collection Time: 09/13/22  7:44 PM  Result Value Ref Range   Color, Urine COLORLESS (A) YELLOW   APPearance CLEAR (A) CLEAR   Specific Gravity, Urine 1.003 (L) 1.005 - 1.030   pH 6.0 5.0 - 8.0   Glucose, UA NEGATIVE NEGATIVE mg/dL   Hgb urine dipstick SMALL (A) NEGATIVE   Bilirubin Urine NEGATIVE NEGATIVE   Ketones, ur NEGATIVE NEGATIVE mg/dL   Protein, ur NEGATIVE NEGATIVE mg/dL   Nitrite NEGATIVE NEGATIVE   Leukocytes,Ua TRACE (A) NEGATIVE   RBC / HPF 0-5 0 - 5 RBC/hpf   WBC, UA 0-5 0 - 5 WBC/hpf   Bacteria, UA RARE (A) NONE SEEN   Squamous Epithelial / HPF 0-5 0 - 5 /HPF   Mucus PRESENT   Troponin I (High Sensitivity)     Status: None   Collection Time: 09/13/22  7:44 PM  Result Value Ref Range   Troponin I (High Sensitivity) 8 <18 ng/L  Troponin I (High Sensitivity)     Status: None   Collection Time: 09/13/22 10:50 PM  Result Value Ref Range   Troponin I (High Sensitivity) 16 <18 ng/L  TSH     Status: Abnormal   Collection Time: 09/14/22  5:19 AM  Result  Value Ref Range   TSH 6.810 (H) 0.350 - 4.500 uIU/mL  Lipid panel  Status: None   Collection Time: 09/14/22  5:19 AM  Result Value Ref Range   Cholesterol 141 0 - 200 mg/dL   Triglycerides 61 <150 mg/dL   HDL 55 >40 mg/dL   Total CHOL/HDL Ratio 2.6 RATIO   VLDL 12 0 - 40 mg/dL   LDL Cholesterol 74 0 - 99 mg/dL  CBC     Status: None   Collection Time: 09/14/22  5:19 AM  Result Value Ref Range   WBC 8.5 4.0 - 10.5 K/uL   RBC 4.59 3.87 - 5.11 MIL/uL   Hemoglobin 14.9 12.0 - 15.0 g/dL   HCT 45.9 36.0 - 46.0 %   MCV 100.0 80.0 - 100.0 fL   MCH 32.5 26.0 - 34.0 pg   MCHC 32.5 30.0 - 36.0 g/dL   RDW 13.2 11.5 - 15.5 %   Platelets 256 150 - 400 K/uL   nRBC 0.0 0.0 - 0.2 %  Creatinine, serum     Status: None   Collection Time: 09/14/22  5:19 AM  Result Value Ref Range   Creatinine, Ser 0.72 0.44 - 1.00 mg/dL   GFR, Estimated >60 >60 mL/min  T4, free     Status: None   Collection Time: 09/14/22  5:19 AM  Result Value Ref Range   Free T4 0.84 0.61 - 1.12 ng/dL     I have reviewed the images obtained:MRI brain - negative  Impression: 72 year old female with transient episode very much consistent with transient global amnesia.  Given the duration of the symptoms, the cessation without treatment, and the character of the symptoms, I think that stroke or seizure are much less likely.  With a negative MRI, I would not treat this as TIA(e.g. no need for antiplatelet therapy or statin based on this event and can continue home doses).  No reason to suspect substance related symptoms.  An EEG is not unreasonable, though likelihood of this being seizure is exceedingly low.  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  Recommendations: 1) EEG is pending 2) if EEG is negative, patient can be discharged with no restrictions or need follow-up unless further episodes occur. 3) defer endocrine testing to internal  medicine 4) could consider repeating MRI as per radiology recommendations versus following up with outpatient neurosurgery 5) neurology will follow-up EEG, but if negative then no further recommendations on an inpatient basis.  Roland Rack, MD [facility] [phone]  If 7pm- 7am, please page neurology on call as listed in [place].

## 2022-09-14 NOTE — Assessment & Plan Note (Signed)
Nicotine patch

## 2022-09-14 NOTE — Assessment & Plan Note (Signed)
Continue trazodone and Klonopin

## 2022-09-14 NOTE — Discharge Summary (Signed)
Physician Discharge Summary   Patient: Kathy Gregory MRN: [id number]  DOB: 05/18/1951   Admit:     Date of Admission: 09/14/2022 Admitted from: home   Discharge: Date of discharge: 09/14/22 Disposition: Home Condition at discharge: good  CODE STATUS: FULL CODE     Discharge Physician: Emeterio Reeve, DO [facility]     PCP: Delsa Grana, PA-C  Recommendations for Outpatient Follow-up:  Follow up with PCP Delsa Grana, PA-C in 1-2 weeks Follow up with outpatient neurology  Please obtain labs/tests: repeat TSH and Free T4 in 4-6 weeks, repeat MRI as below in 6-12 mos Please follow up on the following pending results: prolactin levels    Discharge Instructions     Diet - low sodium heart healthy   Complete by: As directed    Increase activity slowly   Complete by: As directed          Discharge Diagnoses: Active Problems:   Amnesia, global, transient   Migraine   Pituitary cyst ([place])   Hyperlipidemia LDL goal <70   Personal history of tobacco use, presenting hazards to health   Coronary artery calcification   OSA on CPAP   Anxiety   Altered mental status       Hospital Course: Kathy Gregory is a 72 y.o. female with medical history significant for coronary artery calcification, hyperlipidemia, obstructive sleep apnea, tobacco use, arthritis, migraine headaches, and depression/anxiety who was brought to the ED evening 09/13/22 due to concerns for an amnestic episode where she forgot the day's events and asking repetitive questions. No prior history of cognitive deficits.  She had an associated headache Workup in the ED was unremarkable.  Labs were unremarkable and MRI brain was nonacute and normal for age. Patient was treated with a headache cocktail (Toradol and metoclopramide) with improvement in headache but due to persistent problems with recollection, hospitalist consulted for observation.  Admitted to hospitalist service under  observation status. Per admitting hospitalist, daughter stated that patient had improved but still had little recollection of the days events.  Neurology consulted and cleared patient for discharge   Consultants:  Neurology   Procedures: EEG 09/14/22 no concerns       ASSESSMENT & PLAN:   Active Problems:   Amnesia, global, transient   Migraine   Pituitary cyst ([place])   Hyperlipidemia LDL goal <70   Personal history of tobacco use, presenting hazards to health   Coronary artery calcification   OSA on CPAP   Anxiety  Amnesia, global, transient Patient with both retrograde and antegrade amnesia, improving since arrival in hospital MRI brain nonacute but does show pituitary cyst with recommendation for endocrine workup  Possible TIA: ABCD2 score <4 --> single agent antiplatelet  TIA w/u:  Echo no concerns  Carotid US Less than 50% stenosis of both internal carotid arteries, no hemodynamically significant plaques. EEG WNL Neurology consulted and cleared patient for discharge with no restrictions   Pituitary Cyst on MRI - incidental finding TSH --> 6.810, added T4/T3 labs --> Free T4 WNL PRL in process  Pituitary/endocrine w/u can be done outpatient   Migraine Had a headache in the ED, now resolved  Personal history of tobacco use, presenting hazards to health Nicotine patch  Anxiety Continue trazodone and Klonopin  OSA on CPAP CPAP nightly  Hyperlipidemia LDL goal <70 Continue atorvastatin  Coronary artery calcification Continue aspirin and atorvastatin Patient follows with Dr. Saunders Revel  Discharge Instructions  Allergies as of 09/14/2022   No Known Allergies      Medication List     TAKE these medications    aspirin EC 81 MG tablet Take 1 tablet (81 mg total) by mouth daily.   atorvastatin 20 MG tablet Commonly known as: LIPITOR TAKE 1 TABLET BY MOUTH EVERYDAY AT BEDTIME What changed:  how much to take how to take this when to  take this additional instructions   blood glucose meter kit and supplies Kit Dispense based on patient and insurance preference. Use up to four times daily as directed. (FOR ICD-9 250.00, 250.01).   CALCIUM & VIT D3 BONE HEALTH PO Take 2 tablets by mouth daily. Calcium '600mg'$ - Vitamin D '400mg'$    clonazePAM 0.5 MG tablet Commonly known as: KLONOPIN Take 0.5-1 tablets (0.25-0.5 mg total) by mouth daily as needed for anxiety. Never take within six hours of alcohol   Contour Next Test test strip Generic drug: glucose blood USE UP TO 4 TIMES DAILY AS DIRECTED   Fish Oil + D3 1000-1000 MG-UNIT Caps Take by mouth daily.   Microlet Lancets Misc USE UP TO 4 TIMES DAILY AS DIRECTED   psyllium 0.52 g capsule Commonly known as: REGULOID Take 0.52 g by mouth daily.   traZODone 50 MG tablet Commonly known as: DESYREL TAKE 1-2 TABLETS BY MOUTH AT BEDTIME AS NEEDED FOR SLEEP. What changed: reasons to take this         Follow-up Information     Schedule an appointment as soon as possible for a visit  with Delsa Grana, PA-C.   Specialty: Family Medicine Contact information: [address] [place] [address] [phone]                 No Known Allergies   Subjective: pt reports back to normal today. Sister is at bedside and agrees no memory problems or behavior changes at this time.    Discharge Exam: BP 135/65   Pulse 71   Temp 98.7 F (37.1 C)   Resp 18   Ht '5\' 8"'$  (1.727 m)   Wt 99.8 kg   SpO2 95%   BMI 33.45 kg/m  General: Pt is alert, awake, not in acute distress Cardiovascular: RRR, S1/S2 +, no rubs, no gallops Respiratory: CTA bilaterally, no wheezing, no rhonchi Abdominal: Soft, NT, ND, bowel sounds + Extremities: no edema, no cyanosis     The results of significant diagnostics from this hospitalization (including imaging, microbiology, ancillary and laboratory) are listed below for reference.     Microbiology: No results found  for this or any previous visit (from the past 240 hour(s)).   Labs: BNP (last 3 results) No results for input(s): "BNP" in the last 8760 hours. Basic Metabolic Panel: Recent Labs  Lab 09/13/22 1944 09/14/22 0519  NA 141  --   K 4.1  --   CL 108  --   CO2 24  --   GLUCOSE 103*  --   BUN 26*  --   CREATININE 0.77 0.72  CALCIUM 9.8  --    Liver Function Tests: No results for input(s): "AST", "ALT", "ALKPHOS", "BILITOT", "PROT", "ALBUMIN" in the last 168 hours. No results for input(s): "LIPASE", "AMYLASE" in the last 168 hours. No results for input(s): "AMMONIA" in the last 168 hours. CBC: Recent Labs  Lab 09/13/22 1944 09/14/22 0519  WBC 9.9 8.5  HGB 16.0* 14.9  HCT 47.9* 45.9  MCV 98.6 100.0  PLT 275 256  Cardiac Enzymes: No results for input(s): "CKTOTAL", "CKMB", "CKMBINDEX", "TROPONINI" in the last 168 hours. BNP: Invalid input(s): "POCBNP" CBG: No results for input(s): "GLUCAP" in the last 168 hours. D-Dimer No results for input(s): "DDIMER" in the last 72 hours. Hgb A1c No results for input(s): "HGBA1C" in the last 72 hours. Lipid Profile Recent Labs    09/14/22 0519  CHOL 141  HDL 55  LDLCALC 74  TRIG 61  CHOLHDL 2.6   Thyroid function studies Recent Labs    09/14/22 0519  TSH 6.810*   Anemia work up No results for input(s): "VITAMINB12", "FOLATE", "FERRITIN", "TIBC", "IRON", "RETICCTPCT" in the last 72 hours. Urinalysis    Component Value Date/Time   COLORURINE COLORLESS (A) 09/13/2022 1944   APPEARANCEUR CLEAR (A) 09/13/2022 1944   LABSPEC 1.003 (L) 09/13/2022 1944   PHURINE 6.0 09/13/2022 1944   GLUCOSEU NEGATIVE 09/13/2022 1944   HGBUR SMALL (A) 09/13/2022 1944   BILIRUBINUR NEGATIVE 09/13/2022 1944   KETONESUR NEGATIVE 09/13/2022 1944   PROTEINUR NEGATIVE 09/13/2022 1944   NITRITE NEGATIVE 09/13/2022 1944   LEUKOCYTESUR TRACE (A) 09/13/2022 1944   Sepsis Labs Recent Labs  Lab 09/13/22 1944 09/14/22 0519  WBC 9.9 8.5    Microbiology No results found for this or any previous visit (from the past 240 hour(s)). Imaging ECHOCARDIOGRAM COMPLETE  Result Date: 09/14/2022    ECHOCARDIOGRAM REPORT   Patient Name:   Kathy Gregory Date of Exam: 09/14/2022 Medical Rec #:  [id number]       Height:       68.0 in Accession #:    [id number]      Weight:       220.0 lb Date of Birth:  08-16-1951        BSA:          2.128 m Patient Age:    48 years        BP:           118/89 mmHg Patient Gender: F               HR:           67 bpm. Exam Location:  [facility] Procedure: 2D Echo, Cardiac Doppler and Color Doppler Indications:     TIA  History:         Patient has prior history of Echocardiogram examinations, most                  recent 01/20/2022. CAD, TIA, Signs/Symptoms:Dyspnea and Murmur;                  Risk Factors:Diabetes, Current Smoker and Dyslipidemia.  Sonographer:     Wenda Low Referring Phys:  [id number] Athena Masse Diagnosing Phys: Kate Sable MD  Sonographer Comments: Patient is obese. IMPRESSIONS  1. Left ventricular ejection fraction, by estimation, is 60 to 65%. The left ventricle has normal function. The left ventricle has no regional wall motion abnormalities. There is mild left ventricular hypertrophy. Left ventricular diastolic parameters were normal.  2. Right ventricular systolic function is normal. The right ventricular size is normal. Mildly increased right ventricular wall thickness.  3. The mitral valve is normal in structure. Trivial mitral valve regurgitation.  4. The aortic valve is normal in structure. Aortic valve regurgitation is not visualized.  5. The inferior vena cava is normal in size with greater than 50% respiratory variability, suggesting right atrial pressure of 3 mmHg. FINDINGS  Left Ventricle: Left ventricular ejection fraction, by estimation,  is 60 to 65%. The left ventricle has normal function. The left ventricle has no regional wall motion abnormalities. The left ventricular internal  cavity size was normal in size. There is  mild left ventricular hypertrophy. Left ventricular diastolic parameters were normal. Right Ventricle: The right ventricular size is normal. Mildly increased right ventricular wall thickness. Right ventricular systolic function is normal. Left Atrium: Left atrial size was normal in size. Right Atrium: Right atrial size was normal in size. Pericardium: There is no evidence of pericardial effusion. Mitral Valve: The mitral valve is normal in structure. Trivial mitral valve regurgitation. MV peak gradient, 2.8 mmHg. The mean mitral valve gradient is 1.0 mmHg. Tricuspid Valve: The tricuspid valve is normal in structure. Tricuspid valve regurgitation is mild. Aortic Valve: The aortic valve is normal in structure. Aortic valve regurgitation is not visualized. Aortic valve mean gradient measures 3.0 mmHg. Aortic valve peak gradient measures 5.2 mmHg. Aortic valve area, by VTI measures 3.08 cm. Pulmonic Valve: The pulmonic valve was normal in structure. Pulmonic valve regurgitation is not visualized. Aorta: The aortic root is normal in size and structure. Venous: The inferior vena cava is normal in size with greater than 50% respiratory variability, suggesting right atrial pressure of 3 mmHg. IAS/Shunts: No atrial level shunt detected by color flow Doppler.  LEFT VENTRICLE PLAX 2D LVIDd:         4.60 cm   Diastology LVIDs:         2.60 cm   LV e' medial:    8.38 cm/s LV PW:         1.40 cm   LV E/e' medial:  8.8 LV IVS:        1.30 cm   LV e' lateral:   9.79 cm/s LVOT diam:     2.00 cm   LV E/e' lateral: 7.5 LV SV:         88 LV SV Index:   41 LVOT Area:     3.14 cm  RIGHT VENTRICLE RV Basal diam:  3.35 cm RV Mid diam:    3.20 cm RV S prime:     12.20 cm/s TAPSE (M-mode): 2.1 cm LEFT ATRIUM             Index        RIGHT ATRIUM           Index LA diam:        3.30 cm 1.55 cm/m   RA Area:     16.90 cm LA Vol (A2C):   77.4 ml 36.37 ml/m  RA Volume:   42.40 ml  19.92 ml/m LA Vol  (A4C):   44.8 ml 21.05 ml/m LA Biplane Vol: 60.6 ml 28.47 ml/m  AORTIC VALVE                    PULMONIC VALVE AV Area (Vmax):    3.03 cm     PV Vmax:       0.82 m/s AV Area (Vmean):   2.87 cm     PV Peak grad:  2.7 mmHg AV Area (VTI):     3.08 cm AV Vmax:           114.00 cm/s AV Vmean:          80.200 cm/s AV VTI:            0.287 m AV Peak Grad:      5.2 mmHg AV Mean Grad:      3.0 mmHg LVOT  Vmax:         110.00 cm/s LVOT Vmean:        73.300 cm/s LVOT VTI:          0.281 m LVOT/AV VTI ratio: 0.98  AORTA Ao Root diam: 3.60 cm MITRAL VALVE MV Area (PHT): 2.56 cm    SHUNTS MV Area VTI:   3.45 cm    Systemic VTI:  0.28 m MV Peak grad:  2.8 mmHg    Systemic Diam: 2.00 cm MV Mean grad:  1.0 mmHg MV Vmax:       0.83 m/s MV Vmean:      39.0 cm/s MV Decel Time: 296 msec MV E velocity: 73.90 cm/s MV A velocity: 67.10 cm/s MV E/A ratio:  1.10 Kate Sable MD Electronically signed by Kate Sable MD Signature Date/Time: 09/14/2022/1:42:42 PM    Final    EEG adult  Result Date: 09/14/2022 Greta Doom, MD     09/14/2022  1:10 PM History: 72 yo F with transient amnestic episode Sedation: none Technique: This EEG was acquired with electrodes placed according to the International 10-20 electrode system (including Fp1, Fp2, F3, F4, C3, C4, P3, P4, O1, O2, T3, T4, T5, T6, A1, A2, Fz, Cz, Pz). The following electrodes were missing or displaced: none. Background: The background consists of intermixed alpha and beta activities. There is a well defined posterior dominant rhythm of 8-9 Hz that attenuates with eye opening. With drowsiness, there is an increase in slow activity. Sleep is briefly recorded with normal appearing structures. Photic stimulation: Physiologic driving is not performed EEG Abnormalities: None Clinical Interpretation: This normal EEG is recorded in the waking and sleep state. There was no seizure or seizure predisposition recorded on this study. Please note that lack of epileptiform  activity on EEG does not preclude the possibility of epilepsy. Roland Rack, MD [facility] [phone] If 7pm- 7am, please page neurology on call as listed in [place].   US Carotid Bilateral (at [facility] and [facility] only)  Result Date: 09/14/2022 CLINICAL DATA:  [address] with transient ischemic attack. EXAM: BILATERAL CAROTID DUPLEX ULTRASOUND TECHNIQUE: Pearline Cables scale imaging, color Doppler and duplex ultrasound were performed of bilateral carotid and vertebral arteries in the neck. COMPARISON:  None Available. FINDINGS: Criteria: Quantification of carotid stenosis is based on velocity parameters that correlate the residual internal carotid diameter with NASCET-based stenosis levels, using the diameter of the distal internal carotid lumen as the denominator for stenosis measurement. The following velocity measurements were obtained: RIGHT ICA: 65 cm/sec CCA: 867 cm/sec SYSTOLIC ICA/CCA RATIO:  0.7 ECA: 120 cm/sec LEFT ICA: 69 cm/sec CCA: 91 cm/sec SYSTOLIC ICA/CCA RATIO:  0.8 ECA: 103 cm/sec RIGHT CAROTID ARTERY: There is trace echogenic calcific plaque along the anterior wall of the proximal cervical ICA. No other significant plaque is seen. RIGHT VERTEBRAL ARTERY: Flow is antegrade. Peak systolic velocity 61.9 centimeter/second. LEFT CAROTID ARTERY: There is a small amount of scattered nonstenosing echogenic calcific plaque in the common carotid artery, carotid bulb, and proximal cervical ICA. LEFT VERTEBRAL ARTERY: Flow is antegrade, peak systolic velocity 50.9 centimeter/second. Upper extremity blood pressures: Not obtained. IMPRESSION: Less than 50% stenosis of both internal carotid arteries. No hemodynamically significant plaques. There is only trace echogenic plaque in the proximal right cervical ICA, with greater number of scattered calcific plaques in the left common carotid artery, carotid bulb and proximal cervical ICA, all nonstenosing. Antegrade flow in both vertebral arteries. Electronically  Signed   By: Telford Nab M.D.   On:  09/14/2022 06:23   MR BRAIN WO CONTRAST  Result Date: 09/14/2022 CLINICAL DATA:  Initial evaluation for neuro deficit, stroke suspected. EXAM: MRI HEAD WITHOUT CONTRAST TECHNIQUE: Multiplanar, multiecho pulse sequences of the brain and surrounding structures were obtained without intravenous contrast. COMPARISON:  Prior CT from 09/13/2022. FINDINGS: Brain: Cerebral volume within normal limits. No focal parenchymal signal abnormality or significant cerebral white matter disease for age. No evidence for acute or subacute ischemia. Gray-white matter differentiation maintained. No areas of chronic cortical infarction. No acute or chronic intracranial blood products. No mass lesion, midline shift or mass effect. No hydrocephalus or extra-axial fluid collection. 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 Mild suprasellar extension without mass effect on the optic chiasm. Vascular: Major intracranial vascular flow voids are maintained. Skull and upper cervical spine: Craniocervical junction within normal limits. Bone marrow signal intensity normal. No scalp soft tissue abnormality. Sinuses/Orbits: Prior bilateral ocular lens replacement. Scattered mucosal thickening noted about the sphenoethmoidal sinuses. Paranasal sinuses are otherwise largely clear. No mastoid effusion. Other: None. IMPRESSION: 1. No acute intracranial abnormality. 2. 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 Correlation with endocrine function tests recommended. Follow-up pituitary protocol MRI with and without contrast should be performed in 6-12 months. This follows ACR consensus guidelines: Management of Incidental Pituitary Findings on CT, MRI and F18-FDG PET: A White Paper of the ACR Incidental Findings  Committee. J Am Coll Radiol 2018; 15: 527-78. 3. Otherwise normal brain MRI for age. Electronically Signed   By: Jeannine Boga M.D.   On: 09/14/2022 02:55      Time coordinating discharge: over 30 minutes  SIGNED:  Emeterio Reeve DO [facility]

## 2022-09-14 NOTE — ED Notes (Signed)
Pt undergoing EEG at this time.

## 2022-09-14 NOTE — Assessment & Plan Note (Signed)
Continue aspirin and atorvastatin Patient follows with Dr. Saunders Revel

## 2022-09-14 NOTE — Progress Notes (Signed)
Eeg done 

## 2022-09-14 NOTE — ED Notes (Signed)
[place] in w pt

## 2022-09-16 LAB — PROLACTIN: Prolactin: 29.2 ng/mL — ABNORMAL HIGH (ref 3.6–25.2)

## 2022-09-18 DIAGNOSIS — G4733 Obstructive sleep apnea (adult) (pediatric): Secondary | ICD-10-CM | POA: Diagnosis not present

## 2022-10-03 ENCOUNTER — Ambulatory Visit: Payer: BC Managed Care – PPO | Admitting: Family Medicine

## 2022-10-06 ENCOUNTER — Encounter: Payer: Self-pay | Admitting: Family Medicine

## 2022-10-06 ENCOUNTER — Ambulatory Visit (INDEPENDENT_AMBULATORY_CARE_PROVIDER_SITE_OTHER): Payer: BC Managed Care – PPO | Admitting: Family Medicine

## 2022-10-06 VITALS — BP 120/74 | HR 83 | Temp 97.8°F | Resp 16 | Ht 66.5 in | Wt 205.4 lb

## 2022-10-06 DIAGNOSIS — G4700 Insomnia, unspecified: Secondary | ICD-10-CM

## 2022-10-06 DIAGNOSIS — J432 Centrilobular emphysema: Secondary | ICD-10-CM

## 2022-10-06 DIAGNOSIS — Z09 Encounter for follow-up examination after completed treatment for conditions other than malignant neoplasm: Secondary | ICD-10-CM

## 2022-10-06 DIAGNOSIS — I2510 Atherosclerotic heart disease of native coronary artery without angina pectoris: Secondary | ICD-10-CM

## 2022-10-06 DIAGNOSIS — I700 Atherosclerosis of aorta: Secondary | ICD-10-CM | POA: Diagnosis not present

## 2022-10-06 DIAGNOSIS — E669 Obesity, unspecified: Secondary | ICD-10-CM

## 2022-10-06 DIAGNOSIS — E119 Type 2 diabetes mellitus without complications: Secondary | ICD-10-CM

## 2022-10-06 DIAGNOSIS — R413 Other amnesia: Secondary | ICD-10-CM

## 2022-10-06 DIAGNOSIS — R7989 Other specified abnormal findings of blood chemistry: Secondary | ICD-10-CM

## 2022-10-06 DIAGNOSIS — E236 Other disorders of pituitary gland: Secondary | ICD-10-CM

## 2022-10-06 DIAGNOSIS — G454 Transient global amnesia: Secondary | ICD-10-CM

## 2022-10-06 DIAGNOSIS — I2584 Coronary atherosclerosis due to calcified coronary lesion: Secondary | ICD-10-CM | POA: Diagnosis not present

## 2022-10-06 DIAGNOSIS — E785 Hyperlipidemia, unspecified: Secondary | ICD-10-CM

## 2022-10-06 DIAGNOSIS — G4733 Obstructive sleep apnea (adult) (pediatric): Secondary | ICD-10-CM

## 2022-10-06 DIAGNOSIS — E237 Disorder of pituitary gland, unspecified: Secondary | ICD-10-CM

## 2022-10-06 NOTE — Progress Notes (Signed)
Name: Kathy Gregory   MRN: [id number]    DOB: 06-27-1951   Date:10/06/2022       Progress Note  Chief Complaint  Patient presents with   Follow-up   Diabetes   Hypertension     Subjective:   Kathy Gregory is a 72 y.o. 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 Correlation with endocrine function tests recommended. Follow-up pituitary protocol MRI with and without contrast should be performed in 6-12 months.   Neuro consult inpt with Dr. Leonel Ramsay: Impression: 72 year old female with transient episode very much consistent with transient global amnesia.  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   Discharge summary reviewed today: Recommendations for Outpatient Follow-up:  Follow up with PCP Delsa Grana, PA-C in 1-2 weeks Follow up with outpatient neurology  Please obtain labs/tests: repeat TSH and Free T4 in 4-6 weeks, repeat MRI as below in 6-12 mos Please follow up on the following pending results: prolactin levels   Neurology consult note recommendation: Recommendations: 1) EEG is pending 2) if EEG is negative, patient can be discharged with no restrictions or need follow-up unless further episodes occur. 3) defer endocrine testing to internal medicine 4) could consider repeating MRI as per radiology recommendations versus following up with outpatient neurosurgery 5) neurology will follow-up EEG, but if negative then no further recommendations on an inpatient basis.   Roland Rack, MD [facility]   TSH mildly elevated, prolactin  elevated (hospital labs)  DM:   Well controlled after sig diet/lifestyle efforts and weight loss Denies: Polyuria, polydipsia, vision changes, neuropathy, hypoglycemia Recent pertinent labs: Lab Results  Component Value Date   HGBA1C 5.9 (H) 03/31/2022   HGBA1C 6.7 (H) 11/09/2021   HGBA1C 5.8 (H) 05/06/2020   Lab Results  Component Value Date   MICROALBUR <0.2 11/15/2021   LDLCALC 74 09/14/2022   CREATININE 0.72 09/14/2022   Standard of care and health maintenance: Urine Microalbumin:  UTD Foot exam:  UTD (due march) DM eye exam:  due april ACEI/ARB:  not taking Statin:  yes     Current Outpatient Medications:    aspirin EC 81 MG tablet, Take 1 tablet (81 mg total) by mouth daily., Disp: 90 tablet, Rfl: 3   atorvastatin (LIPITOR) 20 MG tablet, TAKE 1 TABLET BY MOUTH EVERYDAY AT BEDTIME (Patient taking differently: Take 20 mg by mouth at bedtime.), Disp: 90 tablet, Rfl: 3   blood glucose meter kit and supplies KIT, Dispense based on patient and insurance preference. Use up to four times daily as directed. (FOR ICD-9 250.00, 250.01)., Disp: 1 each, Rfl: 0   clonazePAM (KLONOPIN) 0.5 MG tablet, Take 0.5-1 tablets (0.25-0.5 mg total) by mouth daily as needed for anxiety. Never take within six hours of alcohol, Disp: 20 tablet, Rfl: 0   CONTOUR NEXT TEST test strip, USE UP TO 4 TIMES DAILY AS DIRECTED, Disp: 100 strip, Rfl: 5   Fish Oil-Cholecalciferol (FISH OIL + D3) 1000-1000 MG-UNIT CAPS, Take by mouth daily. , Disp: , Rfl:    Microlet Lancets MISC, USE UP TO 4 TIMES DAILY AS DIRECTED, Disp: 100 each, Rfl: 6  Multiple Minerals-Vitamins (CALCIUM & VIT D3 BONE HEALTH PO), Take 2 tablets by mouth daily. Calcium 641m- Vitamin D 4050m Disp: , Rfl:    psyllium (REGULOID) 0.52 G capsule, Take 0.52 g by mouth daily., Disp: , Rfl:    traZODone (DESYREL) 50 MG tablet, TAKE 1-2 TABLETS BY MOUTH AT BEDTIME AS NEEDED FOR SLEEP. (Patient taking differently: Take 50-100 mg by mouth at bedtime  as needed for sleep. for sleep), Disp: 180 tablet, Rfl: 2  Patient Active Problem List   Diagnosis Date Noted   Migraine 09/14/2022   Amnesia, global, transient 09/14/2022   Anxiety 09/14/2022   Pituitary cyst ([facility]01/24/2024   Altered mental status 09/14/2022   Dyspnea on exertion 12/25/2021   Heart murmur 12/25/2021   New onset type 2 diabetes mellitus ([facility] [place]03/27/2023   Prediabetes 11/09/2021   Lesion of face 11/09/2021   Class 2 severe obesity with serious comorbidity and body mass index (BMI) of 35.0 to 35.9 in adult (HCDri[facility] [place]09/20/2022   Insomnia 05/11/2021   OSA on CPAP 10/26/2018   Obesity (BMI 30.0-34.9) 01/18/2017   Panic attacks 01/18/2017   Current smoker 01/18/2017   Elevated hematocrit 07/20/2016   Coronary artery calcification 05/23/2016   Aortic atherosclerosis (HCG[facility]10/09/2015   Centrilobular emphysema (HCHays10/09/2015   Osteopenia of neck of femur 04/18/2016   History of colonic polyps    Benign neoplasm of sigmoid colon    Personal history of tobacco use, presenting hazards to health 04/13/2015   Hyperlipidemia LDL goal <70 08/07/2014    Past Surgical History:  Procedure Laterality Date   BREAST CYST ASPIRATION Left    COLONOSCOPY  2006   COLONOSCOPY WITH PROPOFOL N/A 05/15/2015   Procedure: COLONOSCOPY WITH PROPOFOL;  Surgeon: DaLucilla LameMD;  [facility];  Laterality: N/A;   COLONOSCOPY WITH PROPOFOL N/A 06/02/2020   Procedure: COLONOSCOPY WITH PROPOFOL;  Surgeon: WoLucilla LameMD;  [facility];  Service: Endoscopy;  Laterality: N/A;   EYE SURGERY     POLYPECTOMY  05/15/2015   Procedure: POLYPECTOMY;  Surgeon: DaLucilla LameMD;  [place] [facility] [facility];;    Family History  Problem Relation Age of Onset   Hypertension Mother    Diabetes Mother    Heart disease Father 7053     CABG   Stroke Father 7026     Occurred during CABG   Diabetes Brother    Hypertension Brother     Atrial fibrillation Brother    Cirrhosis Brother    Kidney disease Maternal Grandmother    Breast cancer Paternal Grandmother     Social History   Tobacco Use   Smoking status: Every Day    Packs/day: 0.50    Years: 47.00    Total pack years: 23.50    Types: Cigarettes   Smokeless tobacco: Never  Vaping Use   Vaping Use: Never used  Substance Use Topics   Alcohol use: Yes    Alcohol/week: 4.0 standard drinks of alcohol    Types: 4 Glasses of wine per week   Drug use: No     No Known Allergies  Health Maintenance  Topic Date Due   Diabetic kidney evaluation - Urine ACR  Never done   HEMOGLOBIN A1C  10/01/2022   Lung Cancer Screening  11/02/2022   COVID-19 Vaccine (7 - 2023-24 season) 10/22/2022 (Originally 07/15/2022)   FOOT EXAM  11/16/2022   OPHTHALMOLOGY EXAM  12/17/2022   DEXA  SCAN  06/15/2023   MAMMOGRAM  06/17/2023   Diabetic kidney evaluation - eGFR measurement  09/15/2023   COLONOSCOPY (Pts 45-93yr Insurance coverage will need to be confirmed)  06/02/2025   DTaP/Tdap/Td (3 - Td or Tdap) 03/31/2032   Pneumonia Vaccine 72 Years old  Completed   INFLUENZA VACCINE  Completed   Hepatitis C Screening  Completed   Zoster Vaccines- Shingrix  Completed   HPV VACCINES  Aged Out    Chart Review Today: I personally reviewed active problem list, medication list, allergies, family history, social history, health maintenance, notes from last encounter, lab results, imaging with the patient/caregiver today.   Review of Systems  Constitutional: Negative.   HENT: Negative.    Eyes: Negative.   Respiratory: Negative.    Cardiovascular: Negative.   Gastrointestinal: Negative.   Endocrine: Negative.   Genitourinary: Negative.   Musculoskeletal: Negative.   Skin: Negative.   Allergic/Immunologic: Negative.   Neurological: Negative.   Hematological: Negative.   Psychiatric/Behavioral: Negative.    All other systems reviewed and are negative.    Objective:    Vitals:   10/06/22 1028  BP: 120/74  Pulse: 83  Resp: 16  Temp: 97.8 F (36.6 C)  TempSrc: Oral  SpO2: 98%  Weight: 205 lb 6.4 oz (93.2 kg)  Height: 5' 6.5" (1.689 m)    Body mass index is 32.66 kg/m.  Physical Exam Vitals and nursing note reviewed.  Constitutional:      General: She is not in acute distress.    Appearance: Normal appearance. She is well-developed. She is obese. She is not ill-appearing, toxic-appearing or diaphoretic.     Comments: Central/abdominal obesity, thin extremities, well appearing NAD  HENT:     Head: Normocephalic and atraumatic.     Right Ear: External ear normal.     Left Ear: External ear normal.     Nose: Nose normal. No congestion.     Mouth/Throat:     Mouth: Mucous membranes are moist.     Pharynx: Oropharynx is clear. Uvula midline. No oropharyngeal exudate or posterior oropharyngeal erythema.  Eyes:     General: Lids are normal. No scleral icterus.       Right eye: No discharge.        Left eye: No discharge.     Conjunctiva/sclera: Conjunctivae normal.  Neck:     Trachea: Phonation normal. No tracheal deviation.  Cardiovascular:     Rate and Rhythm: Normal rate and regular rhythm.     Pulses: Normal pulses.          Radial pulses are 2+ on the right side and 2+ on the left side.       Posterior tibial pulses are 2+ on the right side and 2+ on the left side.     Heart sounds: Normal heart sounds. No murmur heard.    No friction rub. No gallop.  Pulmonary:     Effort: Pulmonary effort is normal. No respiratory distress.     Breath sounds: Normal breath sounds. No stridor. No wheezing, rhonchi or rales.  Chest:     Chest wall: No tenderness.  Abdominal:     General: Bowel sounds are normal. There is no distension.     Palpations: Abdomen is soft.     Tenderness: There is no abdominal tenderness. There is no guarding or rebound.  Musculoskeletal:     Cervical back: Normal range of motion and neck supple.     Right lower  leg: No edema.  Left lower leg: No edema.  Lymphadenopathy:     Cervical: No cervical adenopathy.  Skin:    General: Skin is warm and dry.     Capillary Refill: Capillary refill takes less than 2 seconds.     Coloration: Skin is not pale.     Findings: No rash.  Neurological:     Mental Status: She is alert. Mental status is at baseline.     Motor: No abnormal muscle tone.     Gait: Gait normal.  Psychiatric:        Mood and Affect: Mood normal.        Speech: Speech normal.        Behavior: Behavior normal.         Assessment & Plan:   Problem List Items Addressed This Visit       Cardiovascular and Mediastinum   Aortic atherosclerosis (HCC) (Chronic)    On prior imaging, pt is on statin      Coronary artery calcification    On statin and ASA, she follows with cardiology        Respiratory   Centrilobular emphysema (HCC) (Chronic)    She denies any DOE, recurrent or chronic cough No inhalers      OSA on CPAP    Good CPAP compliance        Endocrine   New onset type 2 diabetes mellitus ([place]) - Primary    She sig improved diet/lifestyle and lost about 30 lbs Due for urine microalbumin      Relevant Orders   Microalbumin / creatinine urine ratio   Hemoglobin A1c   COMPLETE METABOLIC PANEL WITH GFR   Lesion of pituitary gland (HCC)    MRI brain showing pituitary cyst with recent ER/admission  Endocrine workup recommended  Outpatient endocrinology referral Correlation with endocrine function tests recommended.  Follow-up pituitary protocol MRI with and without contrast should be performed in 6-12 months 02/2023 to 08/2023      Relevant Orders   MR Brain W Wo Contrast   Ambulatory referral to Endocrinology     Other   Obesity (BMI 30.0-34.9) (Chronic)    She's lost weight with sig diet/lifestyle efforts Wt Readings from Last 5 Encounters:  10/06/22 205 lb 6.4 oz (93.2 kg)  09/13/22 220 lb (99.8 kg)  05/16/22 210 lb 6.4 oz (95.4 kg)  03/31/22  213 lb 1.6 oz (96.7 kg)  01/27/22 219 lb 8 oz (99.6 kg)   BMI Readings from Last 5 Encounters:  10/06/22 32.66 kg/m  09/13/22 33.45 kg/m  05/16/22 33.45 kg/m  03/31/22 32.88 kg/m  01/27/22 34.90 kg/m        Hyperlipidemia LDL goal <70    Good statin compliance, lipids recently done in the hospital Lab Results  Component Value Date   CHOL 141 09/14/2022   HDL 55 09/14/2022   LDLCALC 74 09/14/2022   TRIG 61 09/14/2022   CHOLHDL 2.6 09/14/2022        Relevant Orders   COMPLETE METABOLIC PANEL WITH GFR   Insomnia    Sx managed with trazodone and sleep hygiene      Amnesia, global, transient   Other Visit Diagnoses     Encounter for examination following treatment at hospital       amnesia event, seen by neuro, thorough chart and lab review today with pt, due for f/up labs   Relevant Orders   Hemoglobin A1c   COMPLETE METABOLIC PANEL WITH GFR   TSH   T4,  free   Prolactin   Elevated prolactin level       recheck labs and f/up with endocrinology for remainder of endo work up related to pituitary cystic mass/lesion   Relevant Orders   Prolactin   Ambulatory referral to Endocrinology   Elevated TSH       from labs in ED, to be rechecked in 4-6 weeks   Relevant Orders   TSH   T4, free   Ambulatory referral to Endocrinology        Return for end July f/up MRI/pituitary and routine f/up .   Delsa Grana, PA-C 10/06/22 10:41 AM

## 2022-10-06 NOTE — Patient Instructions (Signed)
Come for labs Last week of February to first week of March  Walk in hours 8-11:30 or 1:30-3:30 pm  No need to fast  Just tell the front office you are here for labs and they will help you get everything   We will get an MRI scheduled (6 month from hospital - July) and then do follow up right after that  If you have any episodes come in sooner so we can get you checked out and set up with outpatient neurology

## 2022-10-10 ENCOUNTER — Encounter: Payer: Self-pay | Admitting: Family Medicine

## 2022-10-10 NOTE — Assessment & Plan Note (Addendum)
Sx managed with trazodone and sleep hygiene

## 2022-10-10 NOTE — Assessment & Plan Note (Signed)
Good CPAP compliance

## 2022-10-10 NOTE — Assessment & Plan Note (Signed)
MRI brain showing pituitary cyst with recent ER/admission  Endocrine workup recommended  Outpatient endocrinology referral Correlation with endocrine function tests recommended.  Follow-up pituitary protocol MRI with and without contrast should be performed in 6-12 months 02/2023 to 08/2023

## 2022-10-10 NOTE — Assessment & Plan Note (Signed)
She denies any DOE, recurrent or chronic cough No inhalers

## 2022-10-10 NOTE — Assessment & Plan Note (Signed)
She's lost weight with sig diet/lifestyle efforts Wt Readings from Last 5 Encounters:  10/06/22 205 lb 6.4 oz (93.2 kg)  09/13/22 220 lb (99.8 kg)  05/16/22 210 lb 6.4 oz (95.4 kg)  03/31/22 213 lb 1.6 oz (96.7 kg)  01/27/22 219 lb 8 oz (99.6 kg)   BMI Readings from Last 5 Encounters:  10/06/22 32.66 kg/m  09/13/22 33.45 kg/m  05/16/22 33.45 kg/m  03/31/22 32.88 kg/m  01/27/22 34.90 kg/m

## 2022-10-10 NOTE — Assessment & Plan Note (Signed)
On statin and ASA, she follows with cardiology

## 2022-10-10 NOTE — Assessment & Plan Note (Signed)
She sig improved diet/lifestyle and lost about 30 lbs Due for urine microalbumin

## 2022-10-10 NOTE — Assessment & Plan Note (Signed)
Good statin compliance, lipids recently done in the hospital Lab Results  Component Value Date   CHOL 141 09/14/2022   HDL 55 09/14/2022   LDLCALC 74 09/14/2022   TRIG 61 09/14/2022   CHOLHDL 2.6 09/14/2022

## 2022-10-10 NOTE — Assessment & Plan Note (Signed)
On prior imaging, pt is on statin

## 2022-10-19 DIAGNOSIS — G4733 Obstructive sleep apnea (adult) (pediatric): Secondary | ICD-10-CM | POA: Diagnosis not present

## 2022-10-27 DIAGNOSIS — Z09 Encounter for follow-up examination after completed treatment for conditions other than malignant neoplasm: Secondary | ICD-10-CM | POA: Diagnosis not present

## 2022-10-27 DIAGNOSIS — R7989 Other specified abnormal findings of blood chemistry: Secondary | ICD-10-CM | POA: Diagnosis not present

## 2022-10-27 DIAGNOSIS — E785 Hyperlipidemia, unspecified: Secondary | ICD-10-CM | POA: Diagnosis not present

## 2022-10-27 DIAGNOSIS — E119 Type 2 diabetes mellitus without complications: Secondary | ICD-10-CM | POA: Diagnosis not present

## 2022-10-28 LAB — COMPLETE METABOLIC PANEL WITH GFR
AG Ratio: 1.5 (calc) (ref 1.0–2.5)
ALT: 29 U/L (ref 6–29)
AST: 16 U/L (ref 10–35)
Albumin: 4.1 g/dL (ref 3.6–5.1)
Alkaline phosphatase (APISO): 94 U/L (ref 37–153)
BUN: 24 mg/dL (ref 7–25)
CO2: 28 mmol/L (ref 20–32)
Calcium: 10 mg/dL (ref 8.6–10.4)
Chloride: 106 mmol/L (ref 98–110)
Creat: 0.83 mg/dL (ref 0.60–1.00)
Globulin: 2.8 g/dL (calc) (ref 1.9–3.7)
Glucose, Bld: 91 mg/dL (ref 65–99)
Potassium: 4.3 mmol/L (ref 3.5–5.3)
Sodium: 144 mmol/L (ref 135–146)
Total Bilirubin: 0.4 mg/dL (ref 0.2–1.2)
Total Protein: 6.9 g/dL (ref 6.1–8.1)
eGFR: 76 mL/min/{1.73_m2} (ref >=60)

## 2022-10-28 LAB — HEMOGLOBIN A1C
Hgb A1c MFr Bld: 6.3 % of total Hgb — ABNORMAL HIGH (ref <5.7)
Mean Plasma Glucose: 134 mg/dL
eAG (mmol/L): 7.4 mmol/L

## 2022-10-28 LAB — T4, FREE: Free T4: 0.9 ng/dL (ref 0.8–1.8)

## 2022-10-28 LAB — MICROALBUMIN / CREATININE URINE RATIO
Creatinine, Urine: 102 mg/dL (ref 20–275)
Microalb Creat Ratio: 10 mcg/mg creat (ref <30)
Microalb, Ur: 1 mg/dL

## 2022-10-28 LAB — PROLACTIN: Prolactin: 13.4 ng/mL

## 2022-10-28 LAB — TSH: TSH: 1.88 mIU/L (ref 0.40–4.50)

## 2022-11-02 ENCOUNTER — Ambulatory Visit
Admission: RE | Admit: 2022-11-02 | Discharge: 2022-11-02 | Disposition: A | Discharge status: Home / Self Care | Payer: BC Managed Care – PPO | Source: Ambulatory Visit | Attending: Acute Care | Admitting: Acute Care

## 2022-11-02 DIAGNOSIS — I700 Atherosclerosis of aorta: Secondary | ICD-10-CM | POA: Insufficient documentation

## 2022-11-02 DIAGNOSIS — J439 Emphysema, unspecified: Secondary | ICD-10-CM | POA: Diagnosis not present

## 2022-11-02 DIAGNOSIS — Z87891 Personal history of nicotine dependence: Secondary | ICD-10-CM | POA: Diagnosis not present

## 2022-11-02 DIAGNOSIS — Z122 Encounter for screening for malignant neoplasm of respiratory organs: Secondary | ICD-10-CM | POA: Diagnosis not present

## 2022-11-02 DIAGNOSIS — F17210 Nicotine dependence, cigarettes, uncomplicated: Secondary | ICD-10-CM | POA: Insufficient documentation

## 2022-11-02 DIAGNOSIS — I2510 Atherosclerotic heart disease of native coronary artery without angina pectoris: Secondary | ICD-10-CM | POA: Insufficient documentation

## 2022-11-06 ENCOUNTER — Other Ambulatory Visit: Payer: Self-pay | Admitting: Acute Care

## 2022-11-06 DIAGNOSIS — Z122 Encounter for screening for malignant neoplasm of respiratory organs: Secondary | ICD-10-CM

## 2022-11-06 DIAGNOSIS — F17210 Nicotine dependence, cigarettes, uncomplicated: Secondary | ICD-10-CM

## 2022-11-06 DIAGNOSIS — Z87891 Personal history of nicotine dependence: Secondary | ICD-10-CM

## 2022-11-16 DIAGNOSIS — G4733 Obstructive sleep apnea (adult) (pediatric): Secondary | ICD-10-CM | POA: Diagnosis not present

## 2022-11-17 DIAGNOSIS — G4733 Obstructive sleep apnea (adult) (pediatric): Secondary | ICD-10-CM | POA: Diagnosis not present

## 2022-12-17 DIAGNOSIS — G4733 Obstructive sleep apnea (adult) (pediatric): Secondary | ICD-10-CM | POA: Diagnosis not present

## 2022-12-19 LAB — HM DIABETES EYE EXAM

## 2023-01-16 DIAGNOSIS — G4733 Obstructive sleep apnea (adult) (pediatric): Secondary | ICD-10-CM | POA: Diagnosis not present

## 2023-02-15 DIAGNOSIS — G4733 Obstructive sleep apnea (adult) (pediatric): Secondary | ICD-10-CM | POA: Diagnosis not present

## 2023-02-17 DIAGNOSIS — G4733 Obstructive sleep apnea (adult) (pediatric): Secondary | ICD-10-CM | POA: Diagnosis not present

## 2023-03-17 DIAGNOSIS — G4733 Obstructive sleep apnea (adult) (pediatric): Secondary | ICD-10-CM | POA: Diagnosis not present

## 2023-03-20 ENCOUNTER — Encounter: Payer: Self-pay | Admitting: Family Medicine

## 2023-03-20 ENCOUNTER — Ambulatory Visit: Payer: BC Managed Care – PPO | Admitting: Family Medicine

## 2023-03-20 VITALS — BP 106/68 | HR 78 | Temp 97.9°F | Resp 16 | Ht 66.5 in | Wt 214.8 lb

## 2023-03-20 DIAGNOSIS — E669 Obesity, unspecified: Secondary | ICD-10-CM

## 2023-03-20 DIAGNOSIS — E237 Disorder of pituitary gland, unspecified: Secondary | ICD-10-CM | POA: Diagnosis not present

## 2023-03-20 DIAGNOSIS — E785 Hyperlipidemia, unspecified: Secondary | ICD-10-CM

## 2023-03-20 DIAGNOSIS — E119 Type 2 diabetes mellitus without complications: Secondary | ICD-10-CM | POA: Diagnosis not present

## 2023-03-20 NOTE — Progress Notes (Signed)
Name: Kathy Gregory   MRN: [id number]    DOB: [birth year]   Date:03/20/2023       Progress Note  Chief Complaint  Patient presents with   Follow-up   Diabetes   Hyperlipidemia   imaging    MRI results     Subjective:   Kathy Gregory is a 72 y.o. female, presents to clinic for routine follow-up  Diabetes controlled and improved with diet and lifestyle efforts Lab Results  Component Value Date   HGBA1C 6.3 (H) 10/27/2022  Every morning cbg 90 to less than 110   Hyperlipidemia On lipitor 20 well controlled Lab Results  Component Value Date   CHOL 141 09/14/2022   HDL 55 09/14/2022   LDLCALC 74 09/14/2022   TRIG 61 09/14/2022   CHOLHDL 2.6 09/14/2022   She has kept weight off Wt Readings from Last 5 Encounters:  03/20/23 214 lb 12.8 oz (97.4 kg)  10/06/22 205 lb 6.4 oz (93.2 kg)  09/13/22 220 lb (99.8 kg)  05/16/22 210 lb 6.4 oz (95.4 kg)  03/31/22 213 lb 1.6 oz (96.7 kg)   BMI Readings from Last 5 Encounters:  03/20/23 34.15 kg/m  10/06/22 32.66 kg/m  09/13/22 33.45 kg/m  05/16/22 33.45 kg/m  03/31/22 32.88 kg/m       Current Outpatient Medications:    aspirin EC 81 MG tablet, Take 1 tablet (81 mg total) by mouth daily., Disp: 90 tablet, Rfl: 3   atorvastatin (LIPITOR) 20 MG tablet, TAKE 1 TABLET BY MOUTH EVERYDAY AT BEDTIME (Patient taking differently: Take 20 mg by mouth at bedtime.), Disp: 90 tablet, Rfl: 3   blood glucose meter kit and supplies KIT, Dispense based on patient and insurance preference. Use up to four times daily as directed. (FOR ICD-9 250.00, 250.01)., Disp: 1 each, Rfl: 0   clonazePAM (KLONOPIN) 0.5 MG tablet, Take 0.5-1 tablets (0.25-0.5 mg total) by mouth daily as needed for anxiety. Never take within six hours of alcohol, Disp: 20 tablet, Rfl: 0   CONTOUR NEXT TEST test strip, USE UP TO 4 TIMES DAILY AS DIRECTED, Disp: 100 strip, Rfl: 5   Fish Oil-Cholecalciferol (FISH OIL + D3) 1000-1000 MG-UNIT CAPS, Take by mouth daily. ,  Disp: , Rfl:    Microlet Lancets MISC, USE UP TO 4 TIMES DAILY AS DIRECTED, Disp: 100 each, Rfl: 6   Multiple Minerals-Vitamins (CALCIUM & VIT D3 BONE HEALTH PO), Take 2 tablets by mouth daily. Calcium 600mg - Vitamin D 400mg , Disp: , Rfl:    psyllium (REGULOID) 0.52 G capsule, Take 0.52 g by mouth daily., Disp: , Rfl:    traZODone (DESYREL) 50 MG tablet, TAKE 1-2 TABLETS BY MOUTH AT BEDTIME AS NEEDED FOR SLEEP. (Patient taking differently: Take 50-100 mg by mouth at bedtime as needed for sleep. for sleep), Disp: 180 tablet, Rfl: 2  Patient Active Problem List   Diagnosis Date Noted   Migraine 09/14/2022   Amnesia, global, transient 09/14/2022   Anxiety 09/14/2022   Lesion of pituitary gland (HCC) 09/14/2022   Altered mental status 09/14/2022   Dyspnea on exertion 12/25/2021   Heart murmur 12/25/2021   New onset type 2 diabetes mellitus (HCC) 11/15/2021   Prediabetes 11/09/2021   Lesion of face 11/09/2021   Insomnia 05/11/2021   OSA on CPAP 10/26/2018   Obesity (BMI 30.0-34.9) 01/18/2017   Panic attacks 01/18/2017   Current smoker 01/18/2017   Elevated hematocrit 07/20/2016   Coronary artery calcification 05/23/2016   Aortic atherosclerosis (HCC) 05/23/2016  Centrilobular emphysema (HCC) 05/23/2016   Osteopenia of neck of femur 04/18/2016   History of colonic polyps    Benign neoplasm of sigmoid colon    Personal history of tobacco use, presenting hazards to health 04/13/2015   Hyperlipidemia LDL goal <70 08/07/2014    Past Surgical History:  Procedure Laterality Date   BREAST CYST ASPIRATION Left    COLONOSCOPY  2006   COLONOSCOPY WITH PROPOFOL N/A 05/15/2015   Procedure: COLONOSCOPY WITH PROPOFOL;  Surgeon: Midge Minium, MD;  [facility];  Service: Endoscopy;  Laterality: N/A;   COLONOSCOPY WITH PROPOFOL N/A 06/02/2020   Procedure: COLONOSCOPY WITH PROPOFOL;  Surgeon: Midge Minium, MD;  Location[facility];  Service: Endoscopy;  Laterality: N/A;   EYE  SURGERY     POLYPECTOMY  05/15/2015   Procedure: POLYPECTOMY;  Surgeon: Midge Minium, MD;  Location: [facility];  Service: Endoscopy;;    Family History  Problem Relation Age of Onset   Hypertension Mother    Diabetes Mother    Heart disease Father 75       CABG   Stroke Father 28       Occurred during CABG   Diabetes Brother    Hypertension Brother    Atrial fibrillation Brother    Cirrhosis Brother    Kidney disease Maternal Grandmother    Breast cancer Paternal Grandmother     Social History   Tobacco Use   Smoking status: Every Day    Current packs/day: 0.50    Average packs/day: 0.5 packs/day for 47.0 years (23.5 ttl pk-yrs)    Types: Cigarettes   Smokeless tobacco: Never  Vaping Use   Vaping status: Never Used  Substance Use Topics   Alcohol use: Yes    Alcohol/week: 4.0 standard drinks of alcohol    Types: 4 Glasses of wine per week   Drug use: No     No Known Allergies  Health Maintenance  Topic Date Due   FOOT EXAM  11/16/2022   OPHTHALMOLOGY EXAM  03/20/2023 (Originally 12/17/2022)   COVID-19 Vaccine (7 - 2023-24 season) 04/05/2023 (Originally 07/15/2022)   INFLUENZA VACCINE  03/23/2023   HEMOGLOBIN A1C  04/29/2023   DEXA SCAN  06/15/2023   MAMMOGRAM  06/17/2023   Diabetic kidney evaluation - eGFR measurement  10/27/2023   Diabetic kidney evaluation - Urine ACR  10/27/2023   Lung Cancer Screening  11/02/2023   Colonoscopy  06/02/2025   DTaP/Tdap/Td (3 - Td or Tdap) 03/31/2032   Pneumonia Vaccine 43+ Years old  Completed   Hepatitis C Screening  Completed   Zoster Vaccines- Shingrix  Completed   HPV VACCINES  Aged Out    Chart Review Today: I personally reviewed active problem list, medication list, allergies, family history, social history, health maintenance, notes from last encounter, lab results, imaging with the patient/caregiver today.   Review of Systems  Constitutional: Negative.   HENT: Negative.    Eyes: Negative.    Respiratory: Negative.    Cardiovascular: Negative.   Gastrointestinal: Negative.   Endocrine: Negative.   Genitourinary: Negative.   Musculoskeletal: Negative.   Skin: Negative.   Allergic/Immunologic: Negative.   Neurological: Negative.   Hematological: Negative.   Psychiatric/Behavioral: Negative.    All other systems reviewed and are negative.      Objective:   Vitals:   03/20/23 0857  BP: 106/68  Pulse: 78  Resp: 16  Temp: 97.9 F (36.6 C)  TempSrc: Oral  SpO2: 97%  Weight: 214 lb 12.8  oz (97.4 kg)  Height: 5' 6.5" (1.689 m)    Body mass index is 34.15 kg/m.  Physical Exam Vitals and nursing note reviewed.  Constitutional:      General: She is not in acute distress.    Appearance: Normal appearance. She is well-developed. She is not ill-appearing, toxic-appearing or diaphoretic.  HENT:     Head: Normocephalic and atraumatic.     Nose: Nose normal.  Eyes:     General:        Right eye: No discharge.        Left eye: No discharge.     Conjunctiva/sclera: Conjunctivae normal.  Neck:     Trachea: No tracheal deviation.  Cardiovascular:     Rate and Rhythm: Normal rate and regular rhythm.     Pulses: Normal pulses.     Heart sounds: No murmur heard.    No friction rub. No gallop.  Pulmonary:     Effort: Pulmonary effort is normal. No respiratory distress.     Breath sounds: Normal breath sounds. No stridor. No wheezing, rhonchi or rales.  Abdominal:     General: Bowel sounds are normal.     Palpations: Abdomen is soft.     Comments: Central/abd obesity  Musculoskeletal:     Right lower leg: No edema.     Left lower leg: No edema.  Skin:    General: Skin is warm and dry.     Findings: No rash.  Neurological:     Mental Status: She is alert. Mental status is at baseline.     Motor: No abnormal muscle tone.     Coordination: Coordination normal.     Gait: Gait normal.  Psychiatric:        Mood and Affect: Mood normal.        Behavior: Behavior  normal.         Assessment & Plan:     1. New onset type 2 diabetes mellitus (HCC) A1c did improve with diet lifestyle efforts she will wait until September to recheck her A1c Lab Results  Component Value Date   HGBA1C 6.3 (H) 10/27/2022     2. Hyperlipidemia LDL goal <70 Well-controlled, lipids recently checked in March and reviewed today, good med compliant with Lipitor 20 mg  3. Obesity (BMI 30.0-34.9) She has worked on diet lifestyle efforts and has lost some weight and kept it off  4. Lesion of pituitary gland (HCC) For low up on MRI order -order with authorized but not scheduled.  Patient was given the number to central scheduling to call and follow-up she also has consult with endocrinology at the end of the year  F/up with previously scheduled sept appt for routine f/up A1C and other labs checked at that time  Danelle Berry, PA-C 03/20/23 9:19 AM

## 2023-03-27 ENCOUNTER — Ambulatory Visit
Admission: RE | Admit: 2023-03-27 | Discharge: 2023-03-27 | Disposition: A | Discharge status: Home / Self Care | Payer: BC Managed Care – PPO | Source: Ambulatory Visit | Attending: Family Medicine | Admitting: Family Medicine

## 2023-03-27 DIAGNOSIS — E237 Disorder of pituitary gland, unspecified: Secondary | ICD-10-CM | POA: Insufficient documentation

## 2023-03-27 DIAGNOSIS — E236 Other disorders of pituitary gland: Secondary | ICD-10-CM | POA: Diagnosis not present

## 2023-03-27 MED ORDER — GADOBUTROL 1 MMOL/ML IV SOLN
9.0000 mL | Freq: Once | INTRAVENOUS | Status: AC | PRN
  Administered 2023-03-27: 9 mL via INTRAVENOUS

## 2023-03-31 ENCOUNTER — Encounter: Payer: Self-pay | Admitting: Family Medicine

## 2023-04-10 ENCOUNTER — Encounter: Payer: Self-pay | Admitting: Family Medicine

## 2023-04-17 DIAGNOSIS — G4733 Obstructive sleep apnea (adult) (pediatric): Secondary | ICD-10-CM | POA: Diagnosis not present

## 2023-04-29 ENCOUNTER — Other Ambulatory Visit: Payer: Self-pay | Admitting: Family Medicine

## 2023-04-29 DIAGNOSIS — G4700 Insomnia, unspecified: Secondary | ICD-10-CM

## 2023-05-03 ENCOUNTER — Other Ambulatory Visit: Payer: Self-pay | Admitting: Family Medicine

## 2023-05-03 DIAGNOSIS — Z1231 Encounter for screening mammogram for malignant neoplasm of breast: Secondary | ICD-10-CM

## 2023-05-16 DIAGNOSIS — G4733 Obstructive sleep apnea (adult) (pediatric): Secondary | ICD-10-CM | POA: Diagnosis not present

## 2023-05-18 ENCOUNTER — Encounter: Payer: BC Managed Care – PPO | Admitting: Family Medicine

## 2023-05-20 DIAGNOSIS — G4733 Obstructive sleep apnea (adult) (pediatric): Secondary | ICD-10-CM | POA: Diagnosis not present

## 2023-05-22 ENCOUNTER — Encounter: Payer: Self-pay | Admitting: Physician Assistant

## 2023-05-22 ENCOUNTER — Ambulatory Visit (INDEPENDENT_AMBULATORY_CARE_PROVIDER_SITE_OTHER): Payer: BC Managed Care – PPO | Admitting: Physician Assistant

## 2023-05-22 VITALS — BP 118/68 | HR 94 | Temp 98.1°F | Resp 16 | Ht 66.5 in | Wt 218.4 lb

## 2023-05-22 DIAGNOSIS — Z23 Encounter for immunization: Secondary | ICD-10-CM | POA: Diagnosis not present

## 2023-05-22 DIAGNOSIS — I700 Atherosclerosis of aorta: Secondary | ICD-10-CM

## 2023-05-22 DIAGNOSIS — E119 Type 2 diabetes mellitus without complications: Secondary | ICD-10-CM | POA: Diagnosis not present

## 2023-05-22 DIAGNOSIS — E785 Hyperlipidemia, unspecified: Secondary | ICD-10-CM | POA: Diagnosis not present

## 2023-05-22 DIAGNOSIS — Z0001 Encounter for general adult medical examination with abnormal findings: Secondary | ICD-10-CM | POA: Diagnosis not present

## 2023-05-22 DIAGNOSIS — Z7189 Other specified counseling: Secondary | ICD-10-CM

## 2023-05-22 DIAGNOSIS — Z0000 Encounter for general adult medical examination without abnormal findings: Secondary | ICD-10-CM

## 2023-05-22 DIAGNOSIS — I2510 Atherosclerotic heart disease of native coronary artery without angina pectoris: Secondary | ICD-10-CM

## 2023-05-22 DIAGNOSIS — F410 Panic disorder [episodic paroxysmal anxiety] without agoraphobia: Secondary | ICD-10-CM

## 2023-05-22 DIAGNOSIS — M85859 Other specified disorders of bone density and structure, unspecified thigh: Secondary | ICD-10-CM

## 2023-05-22 HISTORY — DX: Other specified counseling: Z71.89

## 2023-05-22 MED ORDER — CLONAZEPAM 0.5 MG PO TABS: 0.2500 mg | ORAL_TABLET | Freq: Every day | ORAL | 0 refills | Status: DC | PRN

## 2023-05-22 MED ORDER — ATORVASTATIN CALCIUM 20 MG PO TABS: 20.0000 mg | ORAL_TABLET | Freq: Every day | ORAL | 1 refills | Status: DC

## 2023-05-22 NOTE — Assessment & Plan Note (Signed)
Chronic, appears well controlled with lifestyle  Recheck A1c today She is testing glucose at home - mostly getting 100-120s in AM  Recommend she continues with diet and exercise efforts for management  Results to dictate further management  Follow up in 6 months or sooner if concerns arise

## 2023-05-22 NOTE — Progress Notes (Signed)
Annual Physical Exam   Name: Kathy Gregory   MRN: [id number]    DOB: [birth year]   Date:05/22/2023  Today's Provider: Jacquelin Hawking, MHS, PA-C Introduced myself to the patient as a PA-C and provided education on APPs in clinical practice.         Subjective  Chief Complaint  Chief Complaint  Patient presents with   Annual Exam    HPI  Patient presents for annual CPE.  Diet: She is trying to reduce sugar and carbs due to Diabetes. She is trying to eat more vegetables and lean protein Exercise:  She is doing chair exercises 3 days per week - mix of cardio and strength Sleep: "good" getting about 6-7 hours per night, feels well rested in the AM  Mood: "it's good" she reports hx of panic attacks    Anxiety/ panic attacks She reports she gets about 20 Klonopin per year for panic She usually takes about half table then may take the other half as needed for panic She is not aware of triggers She still has 4 tablets left at apt today    DM She checks glucose in the AM  Reports she is getting readings in 100-120 - she usually eats a snack prior to bed so she does not think these are true fasting results  She is not taking medications today      Flowsheet Row Office Visit from 05/22/2023 in [facility]  AUDIT-C Score 2       Depression: Phq 9 is  negative    05/22/2023   10:27 AM 03/20/2023    8:56 AM 10/06/2022   10:28 AM 05/16/2022   10:10 AM 03/31/2022    9:13 AM  Depression screen PHQ 2/9  Decreased Interest 0 0 0 0 0  Down, Depressed, Hopeless 0 0 0 0 0  PHQ - 2 Score 0 0 0 0 0  Altered sleeping 0 0 0 0 0  Tired, decreased energy 0 0 0 0 0  Change in appetite 0 0 0 0 0  Feeling bad or failure about yourself  0 0 0 0 0  Trouble concentrating 0 0 0 0 0  Moving slowly or fidgety/restless 0 0 0 0 0  Suicidal thoughts 0 0 0 0 0  PHQ-9 Score 0 0 0 0 0  Difficult doing work/chores Not difficult at all Not difficult at all Not difficult  at all Not difficult at all Not difficult at all       05/22/2023   10:31 AM 03/20/2023    8:56 AM 11/09/2021   10:06 AM 10/31/2019    8:05 AM  GAD 7 : Generalized Anxiety Score  Nervous, Anxious, on Edge 1 0 1 1  Control/stop worrying 1 0 1 0  Worry too much - different things 1 0 1 0  Trouble relaxing 1 0 0 0  Restless 1 0 0 0  Easily annoyed or irritable 0 0 0 0  Afraid - awful might happen 0 0 1 0  Total GAD 7 Score 5 0 4 1  Anxiety Difficulty Somewhat difficult Not difficult at all  Not difficult at all       Hypertension: BP Readings from Last 3 Encounters:  05/22/23 118/68  03/20/23 106/68  10/06/22 120/74   Obesity: Wt Readings from Last 3 Encounters:  05/22/23 218 lb 6.4 oz (99.1 kg)  03/20/23 214 lb 12.8 oz (97.4 kg)  10/06/22 205 lb  6.4 oz (93.2 kg)   BMI Readings from Last 3 Encounters:  05/22/23 34.72 kg/m  03/20/23 34.15 kg/m  10/06/22 32.66 kg/m     Vaccines:   HPV: aged out  Tdap: UTD  Shingrix: completed  Pneumonia: completed  Flu: offered and administered today  COVID-19: Discussed vaccine and booster recommendations per available CDC guidelines     Hep C Screening: completed  HIV screening: Completed  STD testing and prevention (HIV/chl/gon/syphilis):  Intimate partner violence: negative Sexual History: she is not sexually active  Menstrual History/LMP/Abnormal Bleeding: She has not had period since her 14s  Discussed importance of follow up if any post-menopausal bleeding: yes- she denies bleeding concerns  Incontinence Symptoms: No.  Breast cancer hx:  - Last Mammogram: scheduled for Oct 2024 for screening  - BRCA gene screening:   Osteoporosis Prevention : Discussed high calcium and vitamin D supplementation, weight bearing exercises Bone density :yes- order placed today   Cervical cancer screening: no longer indicated   Skin cancer: Discussed monitoring for atypical lesions  Colorectal cancer screening: UTD   Lung cancer:   Low Dose CT Chest recommended if Age 53-80 years, 20 pack-year currently smoking OR have quit w/in 15years. Patient does qualify.  She had this completed in March 2024  ECG: NA  Advanced Care Planning: A voluntary discussion about advance care planning including the explanation and discussion of advance directives.  Discussed health care proxy and Living will, and the patient was able to identify a health care proxy as no one.  Patient does not have a living will in effect.  Lipids: Lab Results  Component Value Date   CHOL 141 09/14/2022   CHOL 125 05/16/2022   CHOL 141 05/11/2021   Lab Results  Component Value Date   HDL 55 09/14/2022   HDL 52 05/16/2022   HDL 54 05/11/2021   Lab Results  Component Value Date   LDLCALC 74 09/14/2022   LDLCALC 53 05/16/2022   LDLCALC 69 05/11/2021   Lab Results  Component Value Date   TRIG 61 09/14/2022   TRIG 112 05/16/2022   TRIG 98 05/11/2021   Lab Results  Component Value Date   CHOLHDL 2.6 09/14/2022   CHOLHDL 2.4 05/16/2022   CHOLHDL 2.6 05/11/2021   No results found for: "LDLDIRECT"  Glucose: Glucose, Bld  Date Value Ref Range Status  10/27/2022 91 65 - 99 mg/dL Final    Comment:    .            Fasting reference interval .   09/13/2022 103 (H) 70 - 99 mg/dL Final    Comment:    Glucose reference range applies only to samples taken after fasting for at least 8 hours.  05/16/2022 87 65 - 99 mg/dL Final    Comment:    .            Fasting reference interval .     Patient Active Problem List   Diagnosis Date Noted   Advanced care planning/counseling discussion 05/22/2023   Migraine 09/14/2022   Amnesia, global, transient 09/14/2022   Anxiety 09/14/2022   Lesion of pituitary gland (HCC) 09/14/2022   Altered mental status 09/14/2022   Dyspnea on exertion 12/25/2021   Heart murmur 12/25/2021   New onset type 2 diabetes mellitus (HCC) 11/15/2021   Prediabetes 11/09/2021   Lesion of face 11/09/2021   Insomnia  05/11/2021   OSA on CPAP 10/26/2018   Obesity (BMI 30.0-34.9) 01/18/2017   Panic attacks 01/18/2017  Current smoker 01/18/2017   Elevated hematocrit 07/20/2016   Coronary artery calcification 05/23/2016   Aortic atherosclerosis (HCC) 05/23/2016   Centrilobular emphysema (HCC) 05/23/2016   Osteopenia of neck of femur 04/18/2016   History of colonic polyps    Benign neoplasm of sigmoid colon    Personal history of tobacco use, presenting hazards to health 04/13/2015   Hyperlipidemia LDL goal <70 08/07/2014    Past Surgical History:  Procedure Laterality Date   BREAST CYST ASPIRATION Left    COLONOSCOPY  2006   COLONOSCOPY WITH PROPOFOL N/A 05/15/2015   Procedure: COLONOSCOPY WITH PROPOFOL;  Surgeon: Midge Minium, MD;  Location: [facility];  Service: Endoscopy;  Laterality: N/A;   COLONOSCOPY WITH PROPOFOL N/A 06/02/2020   Procedure: COLONOSCOPY WITH PROPOFOL;  Surgeon: Midge Minium, MD;  Location[facility];  Service: Endoscopy;  Laterality: N/A;   EYE SURGERY     POLYPECTOMY  05/15/2015   Procedure: POLYPECTOMY;  Surgeon: Midge Minium, MD;  Location: [facility];  Service: Endoscopy;;    Family History  Problem Relation Age of Onset   Hypertension Mother    Diabetes Mother    Heart disease Father 65       CABG   Stroke Father 48       Occurred during CABG   Diabetes Brother    Hypertension Brother    Atrial fibrillation Brother    Cirrhosis Brother    Kidney disease Maternal Grandmother    Breast cancer Paternal Grandmother     Social History   Socioeconomic History   Marital status: Widowed    Spouse name: Not on file   Number of children: Not on file   Years of education: Not on file   Highest education level: Some college, no degree  Occupational History   Not on file  Tobacco Use   Smoking status: Every Day    Current packs/day: 0.50    Average packs/day: 0.5 packs/day for 47.0 years (23.5 ttl pk-yrs)    Types: Cigarettes   Smokeless  tobacco: Never  Vaping Use   Vaping status: Never Used  Substance and Sexual Activity   Alcohol use: Yes    Alcohol/week: 2.0 standard drinks of alcohol    Types: 2 Glasses of wine per week   Drug use: No   Sexual activity: Not Currently  Other Topics Concern   Not on file  Social History Narrative   Not on file   Social Determinants of Health   Financial Resource Strain: Low Risk  (05/17/2023)   Overall Financial Resource Strain (CARDIA)    Difficulty of Paying Living Expenses: Not hard at all  Food Insecurity: No Food Insecurity (05/17/2023)   Hunger Vital Sign    Worried About Running Out of Food in the Last Year: Never true    Ran Out of Food in the Last Year: Never true  Transportation Needs: No Transportation Needs (05/17/2023)   PRAPARE - Administrator, Civil Service (Medical): No    Lack of Transportation (Non-Medical): No  Physical Activity: Insufficiently Active (05/17/2023)   Exercise Vital Sign    Days of Exercise per Week: 3 days    Minutes of Exercise per Session: 30 min  Stress: Stress Concern Present (05/17/2023)   [employer] of Occupational Health - Occupational Stress Questionnaire    Feeling of Stress : To some extent  Social Connections: Moderately Isolated (05/17/2023)   Social Connection and Isolation Panel [NHANES]    Frequency of Communication with  Friends and Family: More than three times a week    Frequency of Social Gatherings with Friends and Family: Once a week    Attends Religious Services: 1 to 4 times per year    Active Member of [employer] or Organizations: No    Attends Banker Meetings: Not on file    Marital Status: Widowed  Intimate Partner Violence: Not At Risk (05/22/2023)   Humiliation, Afraid, Rape, and Kick questionnaire    Fear of Current or Ex-Partner: No    Emotionally Abused: No    Physically Abused: No    Sexually Abused: No     Current Outpatient Medications:    blood glucose meter kit and supplies  KIT, Dispense based on patient and insurance preference. Use up to four times daily as directed. (FOR ICD-9 250.00, 250.01)., Disp: 1 each, Rfl: 0   CONTOUR NEXT TEST test strip, USE UP TO 4 TIMES DAILY AS DIRECTED, Disp: 100 strip, Rfl: 5   Fish Oil-Cholecalciferol (FISH OIL + D3) 1000-1000 MG-UNIT CAPS, Take by mouth daily. , Disp: , Rfl:    Microlet Lancets MISC, USE UP TO 4 TIMES DAILY AS DIRECTED, Disp: 100 each, Rfl: 6   Multiple Minerals-Vitamins (CALCIUM & VIT D3 BONE HEALTH PO), Take 2 tablets by mouth daily. Calcium 600mg - Vitamin D 400mg , Disp: , Rfl:    psyllium (REGULOID) 0.52 G capsule, Take 0.52 g by mouth daily., Disp: , Rfl:    traZODone (DESYREL) 50 MG tablet, TAKE 1 TO 2 TABLETS BY MOUTH AT BEDTIME AS NEEDED FOR SLEEP, Disp: 180 tablet, Rfl: 1   aspirin EC 81 MG tablet, Take 1 tablet (81 mg total) by mouth daily., Disp: 90 tablet, Rfl: 3   atorvastatin (LIPITOR) 20 MG tablet, Take 1 tablet (20 mg total) by mouth at bedtime., Disp: 90 tablet, Rfl: 1   clonazePAM (KLONOPIN) 0.5 MG tablet, Take 0.5-1 tablets (0.25-0.5 mg total) by mouth daily as needed for anxiety. Never take within six hours of alcohol, Disp: 20 tablet, Rfl: 0  No Known Allergies   Review of Systems  Constitutional:  Negative for chills, fever, malaise/fatigue and weight loss.  HENT:  Positive for tinnitus (Chronic). Negative for hearing loss, nosebleeds and sore throat.   Eyes:  Negative for blurred vision, double vision and photophobia.  Respiratory:  Negative for cough, shortness of breath and wheezing.   Cardiovascular:  Negative for chest pain, palpitations and leg swelling.  Gastrointestinal:  Negative for blood in stool, constipation, diarrhea, heartburn, nausea and vomiting.  Genitourinary:  Negative for dysuria, frequency and hematuria.  Musculoskeletal:  Negative for falls, joint pain and myalgias.  Skin:  Negative for itching and rash.  Neurological:  Negative for dizziness, tingling, tremors, loss  of consciousness, weakness and headaches.  Psychiatric/Behavioral:  Negative for depression, memory loss, substance abuse and suicidal ideas. The patient is not nervous/anxious and does not have insomnia.       Objective  Vitals:   05/22/23 1034  BP: 118/68  Pulse: 94  Resp: 16  Temp: 98.1 F (36.7 C)  TempSrc: Oral  SpO2: 94%  Weight: 218 lb 6.4 oz (99.1 kg)  Height: 5' 6.5" (1.689 m)    Body mass index is 34.72 kg/m.  Physical Exam Vitals reviewed.  Constitutional:      General: She is awake.     Appearance: Normal appearance. She is well-developed and well-groomed.  HENT:     Head: Normocephalic and atraumatic.     Right Ear: Hearing, tympanic  membrane and ear canal normal.     Left Ear: Hearing, tympanic membrane and ear canal normal.     Mouth/Throat:     Lips: Pink.     Mouth: Mucous membranes are moist.     Pharynx: Uvula midline. No pharyngeal swelling, oropharyngeal exudate, posterior oropharyngeal erythema or uvula swelling.     Tonsils: No tonsillar exudate or tonsillar abscesses.  Eyes:     General: Lids are normal. Gaze aligned appropriately.     Extraocular Movements: Extraocular movements intact.     Conjunctiva/sclera: Conjunctivae normal.     Pupils: Pupils are equal, round, and reactive to light.  Neck:     Thyroid: No thyroid mass, thyromegaly or thyroid tenderness.  Cardiovascular:     Rate and Rhythm: Normal rate and regular rhythm.     Pulses: Normal pulses.          Radial pulses are 2+ on the right side and 2+ on the left side.       Dorsalis pedis pulses are 2+ on the right side and 2+ on the left side.     Heart sounds: Normal heart sounds. No murmur heard.    No friction rub. No gallop.  Pulmonary:     Effort: Pulmonary effort is normal.     Breath sounds: Normal breath sounds. No decreased air movement. No decreased breath sounds, wheezing, rhonchi or rales.  Abdominal:     General: Abdomen is flat. Bowel sounds are normal.      Palpations: Abdomen is soft.     Tenderness: There is no abdominal tenderness.  Musculoskeletal:     Cervical back: Normal range of motion and neck supple.     Right lower leg: No edema.     Left lower leg: No edema.  Lymphadenopathy:     Head:     Right side of head: No submental, submandibular or preauricular adenopathy.     Left side of head: No submental, submandibular or preauricular adenopathy.     Cervical:     Right cervical: No superficial cervical adenopathy.    Left cervical: No superficial cervical adenopathy.     Upper Body:     Right upper body: No supraclavicular adenopathy.     Left upper body: No supraclavicular adenopathy.  Neurological:     Mental Status: She is alert and oriented to person, place, and time.     GCS: GCS eye subscore is 4. GCS verbal subscore is 5. GCS motor subscore is 6.     Cranial Nerves: No cranial nerve deficit, dysarthria or facial asymmetry.     Gait: Gait is intact.     Deep Tendon Reflexes:     Reflex Scores:      Patellar reflexes are 2+ on the right side and 2+ on the left side. Psychiatric:        Attention and Perception: Attention and perception normal.        Mood and Affect: Mood and affect normal.        Speech: Speech normal.        Behavior: Behavior normal. Behavior is cooperative.        Thought Content: Thought content normal.        Cognition and Memory: Cognition normal.        Judgment: Judgment normal.      No results found for this or any previous visit (from the past 2160 hour(s)).   Fall Risk:    05/22/2023   10:27 AM  03/20/2023    8:56 AM 10/06/2022   10:28 AM 05/16/2022   10:09 AM 03/31/2022    9:12 AM  Fall Risk   Falls in the past year? 0 0 0 1 1  Number falls in past yr: 0 0 0 0 0  Injury with Fall? 0 0 0 1 1  Risk for fall due to : No Fall Risks No Fall Risks No Fall Risks Impaired balance/gait;Impaired mobility Impaired balance/gait;Impaired mobility  Follow up Falls prevention discussed;Education  provided;Falls evaluation completed Falls prevention discussed;Education provided;Falls evaluation completed Falls prevention discussed;Education provided;Falls evaluation completed Falls prevention discussed;Education provided Education provided;Falls prevention discussed     Functional Status Survey: Is the patient deaf or have difficulty hearing?: No Does the patient have difficulty seeing, even when wearing glasses/contacts?: No Does the patient have difficulty concentrating, remembering, or making decisions?: No Does the patient have difficulty walking or climbing stairs?: Yes Does the patient have difficulty dressing or bathing?: No Does the patient have difficulty doing errands alone such as visiting a doctor's office or shopping?: No   Assessment & Plan  Problem List Items Addressed This Visit       Cardiovascular and Mediastinum   Aortic atherosclerosis (HCC) (Chronic)   Relevant Medications   atorvastatin (LIPITOR) 20 MG tablet   Other Relevant Orders   Lipid Profile     Endocrine   New onset type 2 diabetes mellitus (HCC)    Chronic, appears well controlled with lifestyle  Recheck A1c today She is testing glucose at home - mostly getting 100-120s in AM  Recommend she continues with diet and exercise efforts for management  Results to dictate further management  Follow up in 6 months or sooner if concerns arise        Relevant Medications   atorvastatin (LIPITOR) 20 MG tablet   Other Relevant Orders   HgB A1c     Musculoskeletal and Integument   Osteopenia of neck of femur   Relevant Orders   DG Bone Density     Other   Hyperlipidemia LDL goal <70   Relevant Medications   atorvastatin (LIPITOR) 20 MG tablet   Other Relevant Orders   Lipid Profile   Panic attacks    Chronic, historic condition Ongoing but appears well controlled She usually has script fro Klonopin 0.25-0.5 mg PO PRN with supply of 20 tablets lasting a year PDMP reviewed, no evidence of  worrisome controlled substance use patterns at this time  Refill provided today Reviewed PHQ9 and GAD7 with her today- both mild Continue with current regimen and follow up in 6 months or sooner if concerns arise       Relevant Medications   clonazePAM (KLONOPIN) 0.5 MG tablet   Advanced care planning/counseling discussion    A voluntary discussion about advance care planning including the explanation and discussion of advance directives was extensively discussed  with the patient for 5 minutes with patient and myself present.  Explanation about the health care proxy and Living will was reviewed and packet with forms with explanation of how to fill them out was given.  During this discussion, the patient was able to identify a health care proxy as no one and plans to fill out the paperwork required.  Patient was offered a separate Advance Care Planning visit for further assistance with forms.         Other Visit Diagnoses     Annual physical exam    -  Primary  -USPSTF grade A and  B recommendations reviewed with patient; age-appropriate recommendations, preventive care, screening tests, etc discussed and encouraged; healthy living encouraged; see AVS for patient education given to patient -Discussed importance of 150 minutes of physical activity weekly, eat two servings of fish weekly, eat one serving of tree nuts ( cashews, pistachios, pecans, almonds.Marland Kitchen) every other day, eat 6 servings of fruit/vegetables daily and drink plenty of water and avoid sweet beverages.   -Reviewed Health Maintenance: Yes.     Relevant Orders   Lipid Profile   COMPLETE METABOLIC PANEL WITH GFR   CBC w/Diff/Platelet   HgB A1c   TSH   T4   Coronary artery disease involving native coronary artery of native heart without angina pectoris       noted on CT scans, no current concerning cardiac sx, working on diet/lifestyle and reducing smoking, on statin   Relevant Medications   atorvastatin (LIPITOR) 20 MG  tablet   Need for influenza vaccination       Relevant Orders   Flu Vaccine Trivalent High Dose (Fluad) (Completed)        Return in about 6 months (around 11/19/2023) for DM, HLD.   I, Azel Gumina E Llewellyn Choplin, PA-C, have reviewed all documentation for this visit. The documentation on 05/22/23 for the exam, diagnosis, procedures, and orders are all accurate and complete.   Jacquelin Hawking, MHS, PA-C [facility]

## 2023-05-22 NOTE — Assessment & Plan Note (Signed)
Chronic, historic condition Ongoing but appears well controlled She usually has script fro Klonopin 0.25-0.5 mg PO PRN with supply of 20 tablets lasting a year PDMP reviewed, no evidence of worrisome controlled substance use patterns at this time  Refill provided today Reviewed PHQ9 and GAD7 with her today- both mild Continue with current regimen and follow up in 6 months or sooner if concerns arise

## 2023-05-22 NOTE — Assessment & Plan Note (Signed)
A voluntary discussion about advance care planning including the explanation and discussion of advance directives was extensively discussed  with the patient for 5 minutes with patient and myself present.  Explanation about the health care proxy and Living will was reviewed and packet with forms with explanation of how to fill them out was given.  During this discussion, the patient was able to identify a health care proxy as no one and plans to fill out the paperwork required.  Patient was offered a separate Advance Care Planning visit for further assistance with forms.

## 2023-05-23 LAB — CBC WITH DIFFERENTIAL/PLATELET
Absolute Monocytes: 722 {cells}/uL (ref 200–950)
Basophils Absolute: 77 {cells}/uL (ref 0–200)
Basophils Relative: 0.9 %
Eosinophils Absolute: 138 {cells}/uL (ref 15–500)
Eosinophils Relative: 1.6 %
HCT: 48.2 % — ABNORMAL HIGH (ref 35.0–45.0)
Hemoglobin: 15.9 g/dL — ABNORMAL HIGH (ref 11.7–15.5)
Lymphs Abs: 1815 {cells}/uL (ref 850–3900)
MCH: 32.7 pg (ref 27.0–33.0)
MCHC: 33 g/dL (ref 32.0–36.0)
MCV: 99.2 fL (ref 80.0–100.0)
MPV: 11.5 fL (ref 7.5–12.5)
Monocytes Relative: 8.4 %
Neutro Abs: 5848 {cells}/uL (ref 1500–7800)
Neutrophils Relative %: 68 %
Platelets: 270 10*3/uL (ref 140–400)
RBC: 4.86 10*6/uL (ref 3.80–5.10)
RDW: 12 % (ref 11.0–15.0)
Total Lymphocyte: 21.1 %
WBC: 8.6 10*3/uL (ref 3.8–10.8)

## 2023-05-23 LAB — COMPLETE METABOLIC PANEL WITH GFR
AG Ratio: 1.6 (calc) (ref 1.0–2.5)
ALT: 26 U/L (ref 6–29)
AST: 15 U/L (ref 10–35)
Albumin: 4.4 g/dL (ref 3.6–5.1)
Alkaline phosphatase (APISO): 92 U/L (ref 37–153)
BUN: 23 mg/dL (ref 7–25)
CO2: 29 mmol/L (ref 20–32)
Calcium: 10.2 mg/dL (ref 8.6–10.4)
Chloride: 105 mmol/L (ref 98–110)
Creat: 0.8 mg/dL (ref 0.60–1.00)
Globulin: 2.8 g/dL (ref 1.9–3.7)
Glucose, Bld: 101 mg/dL — ABNORMAL HIGH (ref 65–99)
Potassium: 5 mmol/L (ref 3.5–5.3)
Sodium: 142 mmol/L (ref 135–146)
Total Bilirubin: 0.4 mg/dL (ref 0.2–1.2)
Total Protein: 7.2 g/dL (ref 6.1–8.1)
eGFR: 79 mL/min/{1.73_m2} (ref >=60)

## 2023-05-23 LAB — LIPID PANEL
Cholesterol: 143 mg/dL (ref <200)
HDL: 58 mg/dL (ref >=50)
LDL Cholesterol (Calc): 65 mg/dL
Non-HDL Cholesterol (Calc): 85 mg/dL (ref <130)
Total CHOL/HDL Ratio: 2.5 (calc) (ref <5.0)
Triglycerides: 110 mg/dL (ref <150)

## 2023-05-23 LAB — T4: T4, Total: 7.7 ug/dL (ref 5.1–11.9)

## 2023-05-23 LAB — HEMOGLOBIN A1C
Hgb A1c MFr Bld: 6.6 %{Hb} — ABNORMAL HIGH (ref <5.7)
Mean Plasma Glucose: 143 mg/dL
eAG (mmol/L): 7.9 mmol/L

## 2023-05-23 LAB — TSH: TSH: 1.57 m[IU]/L (ref 0.40–4.50)

## 2023-05-24 NOTE — Progress Notes (Signed)
Your electrolytes, liver and kidney function were overall normal at this time Your CBC did not show signs of anemia but your hemoglobin was a bit elevated which appears to be normal for you. Please make sure you are staying well hydrated  Your A1c was 6.6 which is still within goal - no changes recommended today Your cholesterol looks good Your thyroid testing was normal Please continue with your current regimen and keep upcoming apts.

## 2023-06-15 DIAGNOSIS — G4733 Obstructive sleep apnea (adult) (pediatric): Secondary | ICD-10-CM | POA: Diagnosis not present

## 2023-06-19 ENCOUNTER — Ambulatory Visit
Admission: RE | Admit: 2023-06-19 | Discharge: 2023-06-19 | Disposition: A | Discharge status: Home / Self Care | Payer: BC Managed Care – PPO | Source: Ambulatory Visit | Attending: Family Medicine | Admitting: Family Medicine

## 2023-06-19 DIAGNOSIS — Z1231 Encounter for screening mammogram for malignant neoplasm of breast: Secondary | ICD-10-CM | POA: Insufficient documentation

## 2023-06-19 DIAGNOSIS — H40012 Open angle with borderline findings, low risk, left eye: Secondary | ICD-10-CM | POA: Diagnosis not present

## 2023-06-21 ENCOUNTER — Other Ambulatory Visit: Payer: Self-pay | Admitting: Family Medicine

## 2023-06-21 DIAGNOSIS — R928 Other abnormal and inconclusive findings on diagnostic imaging of breast: Secondary | ICD-10-CM

## 2023-06-21 DIAGNOSIS — R921 Mammographic calcification found on diagnostic imaging of breast: Secondary | ICD-10-CM

## 2023-06-28 ENCOUNTER — Ambulatory Visit
Admission: RE | Admit: 2023-06-28 | Discharge: 2023-06-28 | Disposition: A | Discharge status: Home / Self Care | Payer: BC Managed Care – PPO | Source: Ambulatory Visit | Attending: Family Medicine | Admitting: Family Medicine

## 2023-06-28 DIAGNOSIS — R928 Other abnormal and inconclusive findings on diagnostic imaging of breast: Secondary | ICD-10-CM | POA: Diagnosis not present

## 2023-06-28 DIAGNOSIS — R921 Mammographic calcification found on diagnostic imaging of breast: Secondary | ICD-10-CM | POA: Diagnosis not present

## 2023-06-28 DIAGNOSIS — R92322 Mammographic fibroglandular density, left breast: Secondary | ICD-10-CM | POA: Diagnosis not present

## 2023-06-29 ENCOUNTER — Other Ambulatory Visit: Payer: Self-pay | Admitting: Family Medicine

## 2023-06-29 DIAGNOSIS — R921 Mammographic calcification found on diagnostic imaging of breast: Secondary | ICD-10-CM

## 2023-06-29 DIAGNOSIS — R928 Other abnormal and inconclusive findings on diagnostic imaging of breast: Secondary | ICD-10-CM

## 2023-07-03 ENCOUNTER — Other Ambulatory Visit: Payer: Self-pay | Admitting: Physician Assistant

## 2023-07-03 ENCOUNTER — Encounter: Payer: Self-pay | Admitting: Physician Assistant

## 2023-07-03 DIAGNOSIS — R928 Other abnormal and inconclusive findings on diagnostic imaging of breast: Secondary | ICD-10-CM

## 2023-07-03 DIAGNOSIS — R921 Mammographic calcification found on diagnostic imaging of breast: Secondary | ICD-10-CM

## 2023-07-07 ENCOUNTER — Ambulatory Visit
Admission: RE | Admit: 2023-07-07 | Discharge: 2023-07-07 | Disposition: A | Discharge status: Home / Self Care | Payer: BC Managed Care – PPO | Source: Ambulatory Visit | Attending: Physician Assistant | Admitting: Physician Assistant

## 2023-07-07 ENCOUNTER — Ambulatory Visit
Admission: RE | Admit: 2023-07-07 | Discharge: 2023-07-07 | Disposition: A | Discharge status: Home / Self Care | Payer: BC Managed Care – PPO | Source: Ambulatory Visit | Attending: Physician Assistant

## 2023-07-07 DIAGNOSIS — R921 Mammographic calcification found on diagnostic imaging of breast: Secondary | ICD-10-CM

## 2023-07-07 DIAGNOSIS — N6012 Diffuse cystic mastopathy of left breast: Secondary | ICD-10-CM | POA: Insufficient documentation

## 2023-07-07 DIAGNOSIS — R928 Other abnormal and inconclusive findings on diagnostic imaging of breast: Secondary | ICD-10-CM | POA: Insufficient documentation

## 2023-07-07 HISTORY — PX: BREAST BIOPSY: SHX20

## 2023-07-07 MED ORDER — LIDOCAINE 1 % OPTIME INJ - NO CHARGE
5.0000 mL | Freq: Once | INTRAMUSCULAR | Status: AC
  Administered 2023-07-07: 5 mL
  Filled 2023-07-07: qty 6

## 2023-07-07 MED ORDER — LIDOCAINE-EPINEPHRINE 1 %-1:100000 IJ SOLN
20.0000 mL | Freq: Once | INTRAMUSCULAR | Status: AC
  Administered 2023-07-07: 20 mL
  Filled 2023-07-07: qty 20

## 2023-07-10 LAB — SURGICAL PATHOLOGY

## 2023-07-16 DIAGNOSIS — G4733 Obstructive sleep apnea (adult) (pediatric): Secondary | ICD-10-CM | POA: Diagnosis not present

## 2023-08-03 ENCOUNTER — Ambulatory Visit
Admission: RE | Admit: 2023-08-03 | Discharge: 2023-08-03 | Disposition: A | Discharge status: Home / Self Care | Payer: BC Managed Care – PPO | Source: Ambulatory Visit | Attending: Physician Assistant | Admitting: Physician Assistant

## 2023-08-03 ENCOUNTER — Ambulatory Visit (INDEPENDENT_AMBULATORY_CARE_PROVIDER_SITE_OTHER): Payer: BC Managed Care – PPO | Admitting: Internal Medicine

## 2023-08-03 VITALS — BP 122/74 | HR 76 | Ht 66.5 in | Wt 225.0 lb

## 2023-08-03 DIAGNOSIS — M85859 Other specified disorders of bone density and structure, unspecified thigh: Secondary | ICD-10-CM | POA: Insufficient documentation

## 2023-08-03 DIAGNOSIS — D352 Benign neoplasm of pituitary gland: Secondary | ICD-10-CM | POA: Diagnosis not present

## 2023-08-03 DIAGNOSIS — M85851 Other specified disorders of bone density and structure, right thigh: Secondary | ICD-10-CM | POA: Diagnosis not present

## 2023-08-03 DIAGNOSIS — Z780 Asymptomatic menopausal state: Secondary | ICD-10-CM | POA: Diagnosis not present

## 2023-08-03 NOTE — Progress Notes (Signed)
Name: Kathy Gregory  MRN/ DOB: [id number], [birth year]    Age/ Sex: 72 y.o., female    PCP: Danelle Berry, PA-C   Reason for Endocrinology Evaluation: Pituitary macroadenoma     Date of Initial Endocrinology Evaluation: 08/03/2023     HPI: Kathy Gregory is a 72 y.o. female with a past medical history of DM, dyslipidemia, obesity and pituitary adenoma. The patient presented for initial endocrinology clinic visit on 08/03/2023 for consultative assistance with her pituitary macroadenoma.   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  Repeat MRI 03/27/2023 showed unchanged cystic lesion in the left aspect of the sella, measuring up to 12 x 10 x 12 mm with internal septation, most consistent with a cystic pituitary adenoma.  A normal posterior pituitary bright spot is seen.  The infundibulum is deviated to the right.  In reviewing her records she was noted with a 29.2 NG/mL prolactin level on 08/2022 but this was normalized by 10/2022 at 13.4 NG/mL.  TFTs normal as of 04/2023  Substantial weight gain- fluctuates  Severe hypertension- no  DM- diet controlled  Proximal muscle weakness-no Sudden/ severe headaches- Has mild migraine headaches  Sweating-no Cardiac arrhythmias-no but has a murmur Palpitations- occasional  Fluid retention- no Hypokalemia-no Bedtime - 11-11:30 pm  NO change in shoe or ring size  No vision changes, last eye exam 04/2023 . Has a visual field testing  Denies nausea and vomiting Denies constipation or diarrhea  Mother with pituitary adenoma, required sx and requiring hormone replacement therapy    HISTORY:  Past Medical History:  Past Medical History:  Diagnosis Date   Advanced care planning/counseling discussion 05/22/2023   Anxiety     Arthritis    hips, collarbone   Cataract    surgery in may 2021   Coronary artery calcification    Depression    Diabetes (HCC)    Emphysema of lung (HCC)    Heart murmur 07/24/2016   Hyperlipidemia    Migraines    4-5 per year   Osteoporosis    Other fatigue 12/29/2017   Scoliosis    mild   Sleep apnea    Vertigo    2-3x/yr   Past Surgical History:  Past Surgical History:  Procedure Laterality Date   BREAST BIOPSY Left 07/07/2023   Stereo bx, ribbon clip, path pending   BREAST BIOPSY Left 07/07/2023   MM LT BREAST BX W LOC DEV 1ST LESION IMAGE BX SPEC STEREO GUIDE 07/07/2023 [facility]   BREAST CYST ASPIRATION Left    COLONOSCOPY  2006   COLONOSCOPY WITH PROPOFOL N/A 05/15/2015   Procedure: COLONOSCOPY WITH PROPOFOL;  Surgeon: Midge Minium, MD;  [facility];  Service: Endoscopy;  Laterality: N/A;   COLONOSCOPY WITH PROPOFOL N/A 06/02/2020   Procedure: COLONOSCOPY WITH PROPOFOL;  Surgeon: Midge Minium, MD;  Location: [facility] END[facility];  Service: Endoscopy;  Laterality: N/A;   EYE SURGERY     POLYPECTOMY  05/15/2015   Procedure: POLYPECTOMY;  Surgeon: Midge Minium, MD;  Location: [facility];  Service: Endoscopy;;    Social History:  reports that she has been smoking cigarettes. She has a 23.5 pack-year smoking history. She has never used smokeless tobacco. She reports current alcohol use of about 2.0 standard drinks of alcohol  per week. She reports that she does not use drugs. Family History: family history includes Atrial fibrillation in her brother; Breast cancer in her paternal grandmother; Cirrhosis in her brother; Diabetes in her brother and mother; Heart disease (age of onset: 18) in her father; Hypertension in her brother and mother; Kidney disease in her maternal grandmother; Stroke (age of onset: 30) in her father.   HOME MEDICATIONS: Allergies as of 08/03/2023   No Known Allergies      Medication List        Accurate as of  August 03, 2023  8:38 AM. If you have any questions, ask your nurse or doctor.          STOP taking these medications    aspirin EC 81 MG tablet Stopped by: Johnney Ou Regginald Pask       TAKE these medications    atorvastatin 20 MG tablet Commonly known as: LIPITOR Take 1 tablet (20 mg total) by mouth at bedtime.   blood glucose meter kit and supplies Kit Dispense based on patient and insurance preference. Use up to four times daily as directed. (FOR ICD-9 250.00, 250.01).   CALCIUM & VIT D3 BONE HEALTH PO Take 2 tablets by mouth daily. Calcium 600mg - Vitamin D 400mg    clonazePAM 0.5 MG tablet Commonly known as: KLONOPIN Take 0.5-1 tablets (0.25-0.5 mg total) by mouth daily as needed for anxiety. Never take within six hours of alcohol   Contour Next Test test strip Generic drug: glucose blood USE UP TO 4 TIMES DAILY AS DIRECTED   Fish Oil + D3 1000-1000 MG-UNIT Caps Take by mouth daily.   Microlet Lancets Misc USE UP TO 4 TIMES DAILY AS DIRECTED   psyllium 0.52 g capsule Commonly known as: REGULOID Take 0.52 g by mouth daily.   traZODone 50 MG tablet Commonly known as: DESYREL TAKE 1 TO 2 TABLETS BY MOUTH AT BEDTIME AS NEEDED FOR SLEEP          REVIEW OF SYSTEMS: A comprehensive ROS was conducted with the patient and is negative except as per HPI   OBJECTIVE:  VS: BP 122/74 (BP Location: Left Arm, Patient Position: Sitting, Cuff Size: Large)   Pulse 76   Ht 5' 6.5" (1.689 m)   Wt 225 lb (102.1 kg)   SpO2 96%   BMI 35.77 kg/m    Wt Readings from Last 3 Encounters:  08/03/23 225 lb (102.1 kg)  05/22/23 218 lb 6.4 oz (99.1 kg)  03/20/23 214 lb 12.8 oz (97.4 kg)     EXAM: General: Pt appears well and is in NAD  Eyes: External eye exam normal without stare, lid lag or exophthalmos.  EOM intact.  PERRL.  Neck: General: Supple without adenopathy. Thyroid: Thyroid size normal.  No goiter or nodules appreciated.  Lungs: Clear with good BS bilat    Heart: Auscultation: RRR.  Abdomen: Soft, nontender  Extremities:  BL LE: No pretibial edema   Mental Status: Judgment, insight: Intact Orientation: Oriented to time, place, and person Mood and affect: No depression, anxiety, or agitation     DATA REVIEWED:  Latest Reference Range & Units 08/03/23 09:09  Sodium 135 - 146 mmol/L 144  Potassium 3.5 - 5.3 mmol/L 4.5  Chloride 98 - 110 mmol/L 107  CO2 20 - 32 mmol/L 28  Glucose 65 - 99 mg/dL 546 (H)  BUN 7 - 25 mg/dL 18  Creatinine 2.70 - 3.50 mg/dL 0.93  Calcium 8.6 - 81.8 mg/dL 29.9  BUN/Creatinine Ratio 6 -  22 (calc) SEE NOTE:  FSH mIU/mL 49.3  Prolactin ng/mL 9.9  Estradiol pg/mL 16  TSH 0.40 - 4.50 mIU/L 1.66  T4,Free(Direct) 0.8 - 1.8 ng/dL 1.0  (H): Data is abnormally high    Latest Reference Range & Units 05/22/23 11:14  Sodium 135 - 146 mmol/L 142  Potassium 3.5 - 5.3 mmol/L 5.0  Chloride 98 - 110 mmol/L 105  CO2 20 - 32 mmol/L 29  Glucose 65 - 99 mg/dL 253 (H)  Mean Plasma Glucose mg/dL 664  BUN 7 - 25 mg/dL 23  Creatinine 4.03 - 4.74 mg/dL 2.59  Calcium 8.6 - 56.3 mg/dL 87.5  BUN/Creatinine Ratio 6 - 22 (calc) SEE NOTE:  eGFR > OR = 60 mL/min/1.63m2 79  AG Ratio 1.0 - 2.5 (calc) 1.6  AST 10 - 35 U/L 15  ALT 6 - 29 U/L 26  Total Protein 6.1 - 8.1 g/dL 7.2  Total Bilirubin 0.2 - 1.2 mg/dL 0.4  (H): Data is abnormally high       Brain MRI 03/27/2023 FINDINGS: Brain: No acute infarct or hemorrhage. No hydrocephalus or extra-axial collection. No foci of abnormal susceptibility.   Pituitary/Sella: Unchanged cystic lesion in the left aspect of the sella, measuring up to 12 x 10 x 12 mm (sagittal image 7 series 25, coronal image 8 series 24) with internal septation (coronal image 7 series 24), most consistent with a cystic pituitary adenoma. A normal posterior pituitary bright spot is seen. The infundibulum is deviated to the right. The hypothalamus and mamillary bodies are normal. There is no mass effect  on the optic chiasm or optic nerves. The infundibular and chiasmatic recesses are clear. Normal cavernous sinus and cavernous internal carotid artery flow voids.   Vascular: Normal flow voids and vessel enhancement.   Skull and upper cervical spine: Normal marrow signal and enhancement.   Sinuses/Orbits: No acute findings.   Other: None.   IMPRESSION: Unchanged cystic lesion in the left aspect of the sella, measuring up to 12 mm with internal septation, most consistent with a cystic pituitary adenoma.      ASSESSMENT/PLAN/RECOMMENDATIONS:   Pituitary macroadenoma  -This was diagnosed on MRI 08/2022, repeat MRI 03/2023 showed unchanged cystic lesion of the left aspect of the sella, measuring up to 12 mm with internal septation. -We discussed the pathophysiology of pituitary macroadenoma -I have emphasized the importance of having visual field testing on a regular basis -She will be due for repeat MRI on the next visit -No clinical suspicion for hyper or hypopituitarism -BMP, TFTs, prolactin, FSH have all come back appropriate -IGF, ACTH pending   Follow-up in 8 months  Signed electronically by: Lyndle Herrlich, MD  [facility] Endocrinology  [facility] [address] [place] [place], [place] [address] Phone: [phone] FAX: [phone]   CC: Sander Radon [address] [place] [address] Phone: [phone] Fax: [phone]   Return to Endocrinology clinic as below: Future Appointments  Date Time Provider Department Center  08/03/2023  2:20 PM [facility]  10/02/2023  9:00 AM End, Cristal Deer, MD [facility] None  11/23/2023  8:40 AM Danelle Berry, PA-C [facility]

## 2023-08-03 NOTE — Patient Instructions (Addendum)
Please let your ophthalmologist know that you have a pituitary tumor that is 1.2 cm in diameter and will require visual field testing     SALIVARY CORTISOL COLLECTION INSTRUCTIONS    Precautions:  Please collect sample at Midnight . You will need to do this on 2 nights  No food or fluids 30 minutes prior to collection.  Do not use any creams, lotions on hands, or use steroid inhalers 24- hours prior to collection.  Wash hands carefully.  Avoid any activity that could cause your gums to bleed: including flushing of brushing your teeth.  Kit must not be used in children less than 51 years of age, or a person that is at risk for choking on collection kit.  Instructions for saliva collection:   Rinse mouth thoroughly with water and discard. Do not swallow.  Hold the Salivette at the rim of the suspended insert and remove the stopper.  Remove the swab.  Place swab under tongue until well saturated, approximately 1 minute.   Return the saturated swab to the suspended insert and close the Salivette firmly with the stopper.  Do not remove the tube holding the insert. The Salivette should be sent to the lab with the swab.   Come to the lab and leave the Salivette kit for labeling with your identifying information.   Make sure you refrigerate sample if not bringing to the lab immediately. Try to use cold packs for transportation if available.

## 2023-08-04 NOTE — Progress Notes (Signed)
Your DEXA scan indicated that you have osteopenia which means your bones are not as strong as they once were The goal of management is to prevent progression to osteoporosis and preserve bone strength There are several ways to accomplish this   We recommend consuming 1200 mg of Calcium per day (this can be divided into multiple supplements taken with a meal and in combination with your diet) We recommend consuming 800 IU of Vitamin D per day in addition to the Calcium. This can be accomplished through supplements as well.  Exercise is important in maintaining health and bone strength Please try to perform strength improving exercises or weight training for 3-5 times per week as well as balance/ posture exercises to improve balance and overall physical strength.

## 2023-08-07 ENCOUNTER — Other Ambulatory Visit: Payer: BC Managed Care – PPO

## 2023-08-07 DIAGNOSIS — D352 Benign neoplasm of pituitary gland: Secondary | ICD-10-CM | POA: Diagnosis not present

## 2023-08-10 LAB — FOLLICLE STIMULATING HORMONE: FSH: 49.3 m[IU]/mL

## 2023-08-10 LAB — BASIC METABOLIC PANEL WITH GFR
BUN: 18 mg/dL (ref 7–25)
CO2: 28 mmol/L (ref 20–32)
Calcium: 10 mg/dL (ref 8.6–10.4)
Chloride: 107 mmol/L (ref 98–110)
Creat: 0.74 mg/dL (ref 0.60–1.00)
Glucose, Bld: 104 mg/dL — ABNORMAL HIGH (ref 65–99)
Potassium: 4.5 mmol/L (ref 3.5–5.3)
Sodium: 144 mmol/L (ref 135–146)

## 2023-08-10 LAB — PROLACTIN: Prolactin: 9.9 ng/mL

## 2023-08-10 LAB — TSH: TSH: 1.66 m[IU]/L (ref 0.40–4.50)

## 2023-08-10 LAB — ACTH: C206 ACTH: 41 pg/mL (ref 6–50)

## 2023-08-10 LAB — INSULIN-LIKE GROWTH FACTOR
IGF-I, LC/MS: 216 ng/mL (ref 34–245)
Z-Score (Female): 1.5 {STDV} (ref ?–2.0)

## 2023-08-10 LAB — ESTRADIOL: Estradiol: 16 pg/mL

## 2023-08-10 LAB — CORTISOL: Cortisol, Plasma: 14.5 ug/dL

## 2023-08-10 LAB — T4, FREE: Free T4: 1 ng/dL (ref 0.8–1.8)

## 2023-08-14 DIAGNOSIS — G4733 Obstructive sleep apnea (adult) (pediatric): Secondary | ICD-10-CM | POA: Diagnosis not present

## 2023-08-18 LAB — CORTISOL, SALIVARY
Cortisol Savliva Sample: 0.12 ug/dL
Cortisol, Saliva Sample: 0.12 ug/dL
Draw Date: 12142024
Draw Date: 12152024

## 2023-08-19 DIAGNOSIS — G4733 Obstructive sleep apnea (adult) (pediatric): Secondary | ICD-10-CM | POA: Diagnosis not present

## 2023-09-06 ENCOUNTER — Telehealth: Payer: Self-pay | Admitting: Internal Medicine

## 2023-09-06 DIAGNOSIS — D352 Benign neoplasm of pituitary gland: Secondary | ICD-10-CM

## 2023-09-06 MED ORDER — DEXAMETHASONE 1 MG PO TABS: 1.0000 mg | ORAL_TABLET | Freq: Once | ORAL | 0 refills | Status: AC

## 2023-09-06 NOTE — Telephone Encounter (Signed)
 Please let the patient know saliva cortisol testing came back abnormal, unfortunately this cannot test has been coming off as abnormal for a lot of the patient's.   Will need to proceed with another test   Please schedule the patient for fasting 8 AM labs, she will need to take dexamethasone  1 mg exactly at 11:30 PM the night prior to the test    She should come to the office 8 AM fasting, no coffee or tea, no food but she may have water    Please emphasize the importance of taking the dexamethasone  exactly at 11:30 PM the prior night. TIMING IS IMPORTANT

## 2023-09-06 NOTE — Telephone Encounter (Signed)
 Patient advised and schedule for lab appointment. Patient is aware of the directions prior to lab and verbalized understanding

## 2023-09-12 ENCOUNTER — Other Ambulatory Visit: Payer: BC Managed Care – PPO

## 2023-09-12 DIAGNOSIS — D352 Benign neoplasm of pituitary gland: Secondary | ICD-10-CM | POA: Diagnosis not present

## 2023-09-14 DIAGNOSIS — G4733 Obstructive sleep apnea (adult) (pediatric): Secondary | ICD-10-CM | POA: Diagnosis not present

## 2023-09-14 LAB — CORTISOL: Cortisol, Plasma: 7.2 ug/dL

## 2023-09-15 ENCOUNTER — Telehealth: Payer: Self-pay | Admitting: Internal Medicine

## 2023-09-15 DIAGNOSIS — D352 Benign neoplasm of pituitary gland: Secondary | ICD-10-CM

## 2023-09-15 NOTE — Telephone Encounter (Signed)
Patient states that she took the medication at 1130 and didn't eat or drink anything

## 2023-09-15 NOTE — Telephone Encounter (Signed)
Can you please verify if the patient took dexamethasone tablet at 1130 the night before she came here for cortisol checkup.   Her cortisol level shows that she may not have taken the medication.  I just want to make sure before misdiagnosis

## 2023-09-18 NOTE — Telephone Encounter (Signed)
Lab orders faxed to number provided

## 2023-09-18 NOTE — Telephone Encounter (Signed)
Discussed abnormal saliva cortisol and dexamethasone suppression test with the patient on 09/18/2023 at 1330   Will proceed with 24-hour urine cortisol   A lab order will be faxed to a local [facility] located at [employer] [address]. in [place], [place]   [facility] [phone]   Thanks

## 2023-09-22 NOTE — Progress Notes (Unsigned)
Cardiology Clinic Note   Date: 09/25/2023 ID: Wreatha, Sturgeon 1951-07-12, MRN [id number]  Primary Cardiologist:  Yvonne Kendall, MD  Chief Complaint   Kathy Gregory is a 73 y.o. female who presents to the clinic today for overdue routine follow up.   Patient Profile   Kathy Gregory is followed by Dr. Okey Dupre for the history outlined below.      Past medical history significant for: Coronary artery calcification/aortic atherosclerosis seen on CT. Nuclear stress test 03/07/2018: Normal, low risk study.  No ST segment deviation/T wave inversion noted during stress. Chest CT 11/04/2022: One-vessel coronary atherosclerosis.  Aortic atherosclerosis. OSA. On CPAP. Hyperlipidemia. Lipid panel 05/22/2023: LDL 65, HDL 58, TG 110, total 143. Cardiac murmur. TIA. Echo 09/14/2022: EF 60 to 65%.  No RWMA.  Mild LVH.  Normal diastolic parameters.  Normal RV size/function.  Trivial MR. Carotid ultrasound 09/14/2022: <50% stenosis of both ICA.  There is only trace echogenic plaque in the proximal right cervical ICA with greater number of scattered calcified plaques in the left common carotid artery, carotid bulb and proximal cervical ICA, all nonstenotic.  Antegrade flow in both vertebral arteries. T2DM. Tobacco abuse.  In summary, patient has a history of coronary artery calcification seen on CT dating back to 2017.  She established care with Dr. December 2017 and reported chest pain and stable DOE. She reported occasional palpitations described as "hesitations" while lying in bed. No testing was indicated at that time.  In May 2019 during routine follow-up visit she complained of some increased DOE and fatigue.  She underwent nuclear stress testing which was normal, low risk study.  Patient was last seen in the office by Dr. Okey Dupre on 12/23/2021 for routine follow-up.  She continued with mild exertional dyspnea when very active.  Otherwise no cardiac complaints.  She underwent echo for further  evaluation of cardiac murmur and dyspnea.  Echo showed EF 60 to 65%, no RWMA, Grade I DD, normal RV size/function, no significant valvular abnormalities.  Patient presented to [facility] ED on 09/13/2022 with complaints of memory loss beginning approximately 3 hours prior to arrival.  Head CT showed no acute intracranial abnormalities and some generalized cerebral atrophy and microvascular disease changes of the supratentorial brain. Brain MRI showed 1.3 cystic lesion within the left aspect of the pituitary gland but no other acute intracranial abnormalities.  Carotid ultrasound showed <50% ICA stenosis as detailed above.  Echo showed EF 60 to 65% as detailed above.     History of Present Illness    Today, patient is doing well. Patient denies shortness of breath, dyspnea on exertion, orthopnea or PND. She has occasional lower extremity edema that is normal in the morning and progresses throughout the day. No chest pain, pressure, or tightness. She has occasional palpitations described a "flippy feeling" in her chest that is very brief and she does not find bothersome. 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 She normally follows a chair exercise program but has been out of her routine since the holidays. She has been undergoing extensive workup for the pituitary tumor that was found during hospital admission last year.      ROS: All other systems reviewed and are otherwise negative except as noted in History of Present Illness.  EKGs/Labs Reviewed    EKG Interpretation Date/Time:  Monday September 25 2023 08:59:33 EST Ventricular Rate:  78 PR Interval:  142 QRS Duration:  80 QT Interval:  372 QTC Calculation: 424 R Axis:   11  Text Interpretation: Sinus rhythm with marked sinus arrhythmia When compared with ECG of 14-Sep-2022 01:24, No significant changes Confirmed by Carlos Levering [phone]) on 09/25/2023 9:10:44 AM   05/22/2023: ALT 26; AST 15 08/03/2023: BUN 18; Creat 0.74; Potassium 4.5; Sodium  144   05/22/2023: Hemoglobin 15.9; WBC 8.6   08/03/2023: TSH 1.66           Physical Exam    VS:  BP 112/64   Pulse 78   Wt 222 lb 9.6 oz (101 kg)   SpO2 97%   BMI 35.39 kg/m  , BMI Body mass index is 35.39 kg/m.  GEN: Well nourished, well developed, in no acute distress. Neck: No JVD or carotid bruits. Cardiac:  RRR. 1/6 systolic murmur. No rubs or gallops.   Respiratory:  Respirations regular and unlabored. Clear to auscultation without rales, wheezing or rhonchi. GI: Soft, nontender, nondistended. Extremities: Radials/DP/PT 2+ and equal bilaterally. No clubbing or cyanosis. No edema.  Skin: Warm and dry, no rash. Neuro: Strength intact.  Assessment & Plan   Coronary artery calcification/aortic valve sclerosis seen on CT Chest CT March 2024 showed one-vessel coronary atherosclerosis and aortic atherosclerosis.  Nuclear stress testing July 2019 was normal, low risk study.  Patient denies chest pain, pressure or tightness.  -Continue atorvastatin.  OSA Patient reports 100% adherence with CPAP.  -Continue CPAP use.  Hyperlipidemia LDL September 2024 65, at goal. -Continue atorvastatin.  Tobacco abuse Patient continues to smoke 8-10 cigarettes a day. Discussed systematically cutting down cigarettes.  -Complete cessation encouraged.  Disposition: Return in 1 year or sooner as needed.          Signed, Etta Grandchild. Jahson Emanuele, DNP, NP-C

## 2023-09-24 DIAGNOSIS — D352 Benign neoplasm of pituitary gland: Secondary | ICD-10-CM | POA: Diagnosis not present

## 2023-09-25 ENCOUNTER — Encounter: Payer: Self-pay | Admitting: Student

## 2023-09-25 ENCOUNTER — Ambulatory Visit: Payer: BC Managed Care – PPO | Attending: Student | Admitting: Student

## 2023-09-25 VITALS — BP 112/64 | HR 78 | Wt 222.6 lb

## 2023-09-25 DIAGNOSIS — I2510 Atherosclerotic heart disease of native coronary artery without angina pectoris: Secondary | ICD-10-CM | POA: Diagnosis not present

## 2023-09-25 DIAGNOSIS — Z720 Tobacco use: Secondary | ICD-10-CM | POA: Diagnosis not present

## 2023-09-25 DIAGNOSIS — G4733 Obstructive sleep apnea (adult) (pediatric): Secondary | ICD-10-CM

## 2023-09-25 DIAGNOSIS — E785 Hyperlipidemia, unspecified: Secondary | ICD-10-CM | POA: Diagnosis not present

## 2023-09-25 NOTE — Patient Instructions (Signed)
Medication Instructions:  - Your physician recommends that you continue on your current medications as directed. Please refer to the Current Medication list given to you today.  *If you need a refill on your cardiac medications before your next appointment, please call your pharmacy*   Lab Work: - none ordered  If you have labs (blood work) drawn today and your tests are completely normal, you will receive your results only by: MyChart Message (if you have MyChart) OR A paper copy in the mail If you have any lab test that is abnormal or we need to change your treatment, we will call you to review the results.   Testing/Procedures: - none ordered   Follow-Up: At [facility], you and your health needs are our priority.  As part of our continuing mission to provide you with exceptional heart care, we have created designated Provider Care Teams.  These Care Teams include your primary Cardiologist (physician) and Advanced Practice Providers (APPs -  Physician Assistants and Nurse Practitioners) who all work together to provide you with the care you need, when you need it.  We recommend signing up for the patient portal called "MyChart".  Sign up information is provided on this After Visit Summary.  MyChart is used to connect with patients for Virtual Visits (Telemedicine).  Patients are able to view lab/test results, encounter notes, upcoming appointments, etc.  Non-urgent messages can be sent to your provider as well.   To learn more about what you can do with MyChart, go to [email or web address].    Your next appointment:   1 year(s)  Provider:   You may see Yvonne Kendall, MD or one of the following Advanced Practice Providers on your designated Care Team:    Carlos Levering, NP    Other Instructions N/a

## 2023-09-26 LAB — CREATININE, URINE, RANDOM: Creatinine, Urine: 58 mg/dL

## 2023-09-28 LAB — CORTISOL, URINE, FREE
Cortisol (Ur), Free: 62 ug/(24.h) — ABNORMAL HIGH (ref 6–42)
Cortisol,F,ug/L,U: 27 ug/L

## 2023-10-02 ENCOUNTER — Telehealth: Payer: Self-pay | Admitting: Internal Medicine

## 2023-10-02 ENCOUNTER — Ambulatory Visit: Payer: BC Managed Care – PPO | Admitting: Internal Medicine

## 2023-10-02 DIAGNOSIS — D352 Benign neoplasm of pituitary gland: Secondary | ICD-10-CM

## 2023-10-02 NOTE — Telephone Encounter (Signed)
 Discussed abnormal lab results with the pt on 10/02/2023 at 1315   Discussed elevated 24-hour urine cortisol, salivary cortisol, and dexamethasone  suppression test   ACTH  inappropriately normal  I have referred her to neurosurgery for further evaluation for surgical intervention

## 2023-10-15 DIAGNOSIS — G4733 Obstructive sleep apnea (adult) (pediatric): Secondary | ICD-10-CM | POA: Diagnosis not present

## 2023-10-17 DIAGNOSIS — D352 Benign neoplasm of pituitary gland: Secondary | ICD-10-CM | POA: Diagnosis not present

## 2023-10-26 ENCOUNTER — Other Ambulatory Visit: Payer: Self-pay | Admitting: Family Medicine

## 2023-10-26 DIAGNOSIS — G4700 Insomnia, unspecified: Secondary | ICD-10-CM

## 2023-10-27 ENCOUNTER — Telehealth: Payer: Self-pay

## 2023-10-27 NOTE — Telephone Encounter (Signed)
 Patient states she was waiting to hear back about some additional work up that was suggest by neurosurgery  and she has heard anything.

## 2023-10-27 NOTE — Telephone Encounter (Signed)
 Patient states that she was advise by the doctor at [facility] that they didn't see any sign of Cushing syndrome. They want her to return to endo and have test repeat and if still abnormal they would decided is surgery was needed.

## 2023-10-31 NOTE — Telephone Encounter (Signed)
 Patient aware.

## 2023-11-03 ENCOUNTER — Ambulatory Visit
Admission: RE | Admit: 2023-11-03 | Discharge: 2023-11-03 | Disposition: A | Discharge status: Home / Self Care | Payer: BC Managed Care – PPO | Source: Ambulatory Visit | Attending: Acute Care | Admitting: Acute Care

## 2023-11-03 DIAGNOSIS — Z87891 Personal history of nicotine dependence: Secondary | ICD-10-CM | POA: Insufficient documentation

## 2023-11-03 DIAGNOSIS — Z122 Encounter for screening for malignant neoplasm of respiratory organs: Secondary | ICD-10-CM | POA: Diagnosis not present

## 2023-11-03 DIAGNOSIS — F17210 Nicotine dependence, cigarettes, uncomplicated: Secondary | ICD-10-CM | POA: Diagnosis not present

## 2023-11-03 NOTE — Telephone Encounter (Signed)
 Patient left vm to follow up on conversation and what next steps should be.  Please return call. [phone]

## 2023-11-06 NOTE — Telephone Encounter (Signed)
 Advised patient again to speak with neuro as per Dr. Lonzo Cloud last conversation with the pa they would do test as she didn't trust result here.

## 2023-11-14 ENCOUNTER — Other Ambulatory Visit: Payer: Self-pay | Admitting: Physician Assistant

## 2023-11-14 DIAGNOSIS — I700 Atherosclerosis of aorta: Secondary | ICD-10-CM

## 2023-11-14 DIAGNOSIS — G4733 Obstructive sleep apnea (adult) (pediatric): Secondary | ICD-10-CM | POA: Diagnosis not present

## 2023-11-14 DIAGNOSIS — I2510 Atherosclerotic heart disease of native coronary artery without angina pectoris: Secondary | ICD-10-CM

## 2023-11-14 DIAGNOSIS — E785 Hyperlipidemia, unspecified: Secondary | ICD-10-CM

## 2023-11-15 NOTE — Telephone Encounter (Signed)
 Requested Prescriptions  Pending Prescriptions Disp Refills   atorvastatin (LIPITOR) 20 MG tablet [Pharmacy Med Name: ATORVASTATIN 20 MG TABLET] 90 tablet 1    Sig: TAKE 1 TABLET BY MOUTH EVERYDAY AT BEDTIME     Cardiovascular:  Antilipid - Statins Failed - 11/15/2023 12:22 PM      Failed - Lipid Panel in normal range within the last 12 months    Cholesterol  Date Value Ref Range Status  05/22/2023 143 <200 mg/dL Final   LDL Cholesterol (Calc)  Date Value Ref Range Status  05/22/2023 65 mg/dL (calc) Final    Comment:    Reference range: <100 . Desirable range <100 mg/dL for primary prevention;   <70 mg/dL for patients with CHD or diabetic patients  with > or = 2 CHD risk factors. Marland Kitchen LDL-C is now calculated using the Martin-Hopkins  calculation, which is a validated novel method providing  better accuracy than the Friedewald equation in the  estimation of LDL-C.  Horald Pollen et al. Lenox Ahr. 1610;960(45): 2061-2068  (http://education.[employer].com/faq/FAQ164)    HDL  Date Value Ref Range Status  05/22/2023 58 > OR = 50 mg/dL Final   Triglycerides  Date Value Ref Range Status  05/22/2023 110 <150 mg/dL Final         Passed - Patient is not pregnant      Passed - Valid encounter within last 12 months    Recent Outpatient Visits           5 months ago Annual physical exam   [place] [facility] Mecum, Erin E, PA-C   8 months ago New onset type 2 diabetes mellitus [facility])   [place] [facility] Danelle Berry, PA-C   1 year ago New onset type 2 diabetes mellitus [facility])   [place] [facility] Danelle Berry, PA-C   1 year ago Annual physical exam   [facility] Danelle Berry, PA-C   1 year ago New onset type 2 diabetes mellitus [facility])   [facility] Danelle Berry, PA-C       Future Appointments             In 1 week Danelle Berry, PA-C [facility]

## 2023-11-17 DIAGNOSIS — G4733 Obstructive sleep apnea (adult) (pediatric): Secondary | ICD-10-CM | POA: Diagnosis not present

## 2023-11-23 ENCOUNTER — Encounter: Payer: Self-pay | Admitting: Family Medicine

## 2023-11-23 ENCOUNTER — Ambulatory Visit
Admission: RE | Admit: 2023-11-23 | Discharge: 2023-11-23 | Disposition: A | Discharge status: Home / Self Care | Attending: Family Medicine | Admitting: Family Medicine

## 2023-11-23 ENCOUNTER — Ambulatory Visit: Payer: Self-pay | Admitting: Family Medicine

## 2023-11-23 ENCOUNTER — Ambulatory Visit
Admission: RE | Admit: 2023-11-23 | Discharge: 2023-11-23 | Disposition: A | Discharge status: Home / Self Care | Source: Ambulatory Visit | Attending: Family Medicine | Admitting: Family Medicine

## 2023-11-23 VITALS — BP 122/68 | HR 70 | Resp 16 | Ht 66.0 in | Wt 220.0 lb

## 2023-11-23 DIAGNOSIS — M25552 Pain in left hip: Secondary | ICD-10-CM | POA: Insufficient documentation

## 2023-11-23 DIAGNOSIS — R7989 Other specified abnormal findings of blood chemistry: Secondary | ICD-10-CM

## 2023-11-23 DIAGNOSIS — E785 Hyperlipidemia, unspecified: Secondary | ICD-10-CM | POA: Diagnosis not present

## 2023-11-23 DIAGNOSIS — E1169 Type 2 diabetes mellitus with other specified complication: Secondary | ICD-10-CM | POA: Diagnosis not present

## 2023-11-23 DIAGNOSIS — J432 Centrilobular emphysema: Secondary | ICD-10-CM

## 2023-11-23 DIAGNOSIS — I700 Atherosclerosis of aorta: Secondary | ICD-10-CM | POA: Diagnosis not present

## 2023-11-23 DIAGNOSIS — I2510 Atherosclerotic heart disease of native coronary artery without angina pectoris: Secondary | ICD-10-CM

## 2023-11-23 DIAGNOSIS — E119 Type 2 diabetes mellitus without complications: Secondary | ICD-10-CM

## 2023-11-23 NOTE — Patient Instructions (Signed)
   Lab Results  Component Value Date   HGBA1C 6.6 (H) 05/22/2023   HGBA1C 6.3 (H) 10/27/2022   HGBA1C 5.9 (H) 03/31/2022   HGBA1C 6.7 (H) 11/09/2021   HGBA1C 5.8 (H) 05/06/2020    Let me know if the hip is bothering you enough to need a consult with ortho or PT

## 2023-11-23 NOTE — Progress Notes (Signed)
 Name: Kathy Gregory   MRN: [id number]    DOB: 1950-09-01   Date:11/23/2023       Progress Note  Chief Complaint  Patient presents with   Medical Management of Chronic Issues     Subjective:   Kathy Gregory is a 73 y.o. female, presents to clinic for routine follow up on chronic conditions  Here for f/up on HLD and DM Not on meds improving with diet efforts  Lab Results  Component Value Date   HGBA1C 6.6 (H) 05/22/2023   HGBA1C 6.3 (H) 10/27/2022   HGBA1C 5.9 (H) 03/31/2022   HGBA1C 6.7 (H) 11/09/2021   HGBA1C 5.8 (H) 05/06/2020    HLD on atorvastatin, tolerating Lab Results  Component Value Date   CHOL 143 05/22/2023   HDL 58 05/22/2023   LDLCALC 65 05/22/2023   TRIG 110 05/22/2023   CHOLHDL 2.5 05/22/2023   Weight stable to down a little bit over the past 4 months Wt Readings from Last 10 Encounters:  11/23/23 220 lb (99.8 kg)  09/25/23 222 lb 9.6 oz (101 kg)  08/03/23 225 lb (102.1 kg)  05/22/23 218 lb 6.4 oz (99.1 kg)  03/20/23 214 lb 12.8 oz (97.4 kg)  10/06/22 205 lb 6.4 oz (93.2 kg)  09/13/22 220 lb (99.8 kg)  05/16/22 210 lb 6.4 oz (95.4 kg)  03/31/22 213 lb 1.6 oz (96.7 kg)  01/27/22 219 lb 8 oz (99.6 kg)   BMI Readings from Last 5 Encounters:  11/23/23 35.51 kg/m  09/25/23 35.39 kg/m  08/03/23 35.77 kg/m  05/22/23 34.72 kg/m  03/20/23 34.15 kg/m   Hip pain to outer left hip/greater trochanter area, onset months ago, worse in the morning, she has done some exercises with chiropractor, she limps a little and then sometimes is better the rest of the day.      Current Outpatient Medications:    atorvastatin (LIPITOR) 20 MG tablet, TAKE 1 TABLET BY MOUTH EVERYDAY AT BEDTIME, Disp: 90 tablet, Rfl: 1   blood glucose meter kit and supplies KIT, Dispense based on patient and insurance preference. Use up to four times daily as directed. (FOR ICD-9 250.00, 250.01)., Disp: 1 each, Rfl: 0   clonazePAM (KLONOPIN) 0.5 MG tablet, Take 0.5-1 tablets  (0.25-0.5 mg total) by mouth daily as needed for anxiety. Never take within six hours of alcohol, Disp: 20 tablet, Rfl: 0   CONTOUR NEXT TEST test strip, USE UP TO 4 TIMES DAILY AS DIRECTED, Disp: 100 strip, Rfl: 5   Fish Oil-Cholecalciferol (FISH OIL + D3) 1000-1000 MG-UNIT CAPS, Take by mouth daily. , Disp: , Rfl:    Microlet Lancets MISC, USE UP TO 4 TIMES DAILY AS DIRECTED, Disp: 100 each, Rfl: 6   Multiple Minerals-Vitamins (CALCIUM & VIT D3 BONE HEALTH PO), Take 2 tablets by mouth daily. Calcium 600mg - Vitamin D 400mg , Disp: , Rfl:    psyllium (REGULOID) 0.52 G capsule, Take 0.52 g by mouth daily., Disp: , Rfl:    traZODone (DESYREL) 50 MG tablet, TAKE 1 TO 2 TABLETS BY MOUTH AT BEDTIME AS NEEDED FOR SLEEP, Disp: 180 tablet, Rfl: 1  Patient Active Problem List   Diagnosis Date Noted   Pituitary macroadenoma (HCC) 08/03/2023   Advanced care planning/counseling discussion 05/22/2023   Migraine 09/14/2022   Amnesia, global, transient 09/14/2022   Anxiety 09/14/2022   Lesion of pituitary gland (HCC) 09/14/2022   Altered mental status 09/14/2022   Dyspnea on exertion 12/25/2021   Heart murmur 12/25/2021   New  onset type 2 diabetes mellitus (HCC) 11/15/2021   Prediabetes 11/09/2021   Lesion of face 11/09/2021   Insomnia 05/11/2021   OSA on CPAP 10/26/2018   Obesity (BMI 30.0-34.9) 01/18/2017   Panic attacks 01/18/2017   Current smoker 01/18/2017   Elevated hematocrit 07/20/2016   Coronary artery calcification 05/23/2016   Aortic atherosclerosis (HCC) 05/23/2016   Centrilobular emphysema (HCC) 05/23/2016   Osteopenia of neck of femur 04/18/2016   History of colonic polyps    Benign neoplasm of sigmoid colon    Personal history of tobacco use, presenting hazards to health 04/13/2015   Hyperlipidemia LDL goal <70 08/07/2014    Past Surgical History:  Procedure Laterality Date   BREAST BIOPSY Left 07/07/2023   Stereo bx, ribbon clip, path pending   BREAST BIOPSY Left 07/07/2023    MM LT BREAST BX W LOC DEV 1ST LESION IMAGE BX SPEC STEREO GUIDE 07/07/2023 [facility]   BREAST CYST ASPIRATION Left    COLONOSCOPY  2006   COLONOSCOPY WITH PROPOFOL N/A 05/15/2015   Procedure: COLONOSCOPY WITH PROPOFOL;  Surgeon: Midge Minium, MD;  Location: [facility];  Service: Endoscopy;  Laterality: N/A;   COLONOSCOPY WITH PROPOFOL N/A 06/02/2020   Procedure: COLONOSCOPY WITH PROPOFOL;  Surgeon: Midge Minium, MD;  Location: [facility];  Service: Endoscopy;  Laterality: N/A;   EYE SURGERY     POLYPECTOMY  05/15/2015   Procedure: POLYPECTOMY;  Surgeon: Midge Minium, MD;  Location: [facility];  Service: Endoscopy;;    Family History  Problem Relation Age of Onset   Hypertension Mother    Diabetes Mother    Heart disease Father 43       CABG   Stroke Father 59       Occurred during CABG   Diabetes Brother    Hypertension Brother    Atrial fibrillation Brother    Cirrhosis Brother    Kidney disease Maternal Grandmother    Breast cancer Paternal Grandmother     Social History   Tobacco Use   Smoking status: Every Day    Current packs/day: 0.50    Average packs/day: 0.5 packs/day for 47.0 years (23.5 ttl pk-yrs)    Types: Cigarettes   Smokeless tobacco: Never  Vaping Use   Vaping status: Never Used  Substance Use Topics   Alcohol use: Yes    Alcohol/week: 2.0 standard drinks of alcohol    Types: 2 Glasses of wine per week   Drug use: No     No Known Allergies  Health Maintenance  Topic Date Due   Diabetic kidney evaluation - Urine ACR  10/27/2023   Lung Cancer Screening  11/02/2023   HEMOGLOBIN A1C  11/19/2023   COVID-19 Vaccine (8 - 2024-25 season) 12/07/2023   OPHTHALMOLOGY EXAM  12/19/2023   INFLUENZA VACCINE  03/22/2024   FOOT EXAM  05/21/2024   MAMMOGRAM  06/18/2024   Diabetic kidney evaluation - eGFR measurement  08/02/2024   Colonoscopy  06/02/2025   DEXA SCAN  08/02/2025   DTaP/Tdap/Td (3 - Td or Tdap) 03/31/2032    Pneumonia Vaccine 21+ Years old  Completed   Hepatitis C Screening  Completed   Zoster Vaccines- Shingrix  Completed   HPV VACCINES  Aged Out    Chart Review Today: I personally reviewed active problem list, medication list, allergies, family history, social history, health maintenance, notes from last encounter, lab results, imaging with the patient/caregiver today.   Review of Systems  Constitutional: Negative.   HENT: Negative.  Eyes: Negative.   Respiratory: Negative.    Cardiovascular: Negative.   Gastrointestinal: Negative.   Endocrine: Negative.   Genitourinary: Negative.   Musculoskeletal: Negative.   Skin: Negative.   Allergic/Immunologic: Negative.   Neurological: Negative.   Hematological: Negative.   Psychiatric/Behavioral: Negative.    All other systems reviewed and are negative.    Objective:   Vitals:   11/23/23 0828  BP: 122/68  Pulse: 70  Resp: 16  SpO2: 99%  Weight: 220 lb (99.8 kg)  Height: 5\' 6"  (1.676 m)    Body mass index is 35.51 kg/m.  Physical Exam Vitals and nursing note reviewed.  Constitutional:      General: She is not in acute distress.    Appearance: Normal appearance. She is well-developed. She is obese. She is not ill-appearing, toxic-appearing or diaphoretic.  HENT:     Head: Normocephalic and atraumatic.     Nose: Nose normal.  Eyes:     General:        Right eye: No discharge.        Left eye: No discharge.     Conjunctiva/sclera: Conjunctivae normal.  Neck:     Trachea: No tracheal deviation.  Cardiovascular:     Rate and Rhythm: Normal rate and regular rhythm.  Pulmonary:     Effort: Pulmonary effort is normal. No respiratory distress.     Breath sounds: No stridor.  Musculoskeletal:     Left hip: Tenderness present. No bony tenderness or crepitus. Normal range of motion.  Skin:    General: Skin is warm and dry.     Findings: No rash.  Neurological:     Mental Status: She is alert.     Motor: No abnormal  muscle tone.     Coordination: Coordination normal.     Gait: Gait abnormal.  Psychiatric:        Behavior: Behavior normal.      Functional Status Survey:   Results for orders placed or performed in visit on 09/15/23  Cortisol, urine, free   Collection Time: 09/24/23  8:00 AM  Result Value Ref Range   Cortisol,F,ug/L,U 27 Undefined ug/L   Cortisol (Ur), Free 62 (H) 6 - 42 ug/24 hr  Creatinine, Urine   Collection Time: 09/24/23  8:00 AM  Result Value Ref Range   Creatinine, Urine 58.0 Not Estab. mg/dL      Assessment & Plan:   Problem List Items Addressed This Visit     Aortic atherosclerosis (HCC) (Chronic)   On statin      Centrilobular emphysema (HCC) (Chronic)   No current sx or recent exacerbations, still smoking, working on cutting back      Hyperlipidemia LDL goal <70   Lab Results  Component Value Date   CHOL 143 05/22/2023   HDL 58 05/22/2023   LDLCALC 65 05/22/2023   TRIG 110 05/22/2023   CHOLHDL 2.5 05/22/2023   Lipids checked sept, good compliance, no SE or concerns, no med changes, cont atorvastatin      Relevant Orders   Comprehensive metabolic panel with GFR (Completed)   New onset type 2 diabetes mellitus (HCC) - Primary     Lab Results  Component Value Date   HGBA1C 6.6 (H) 05/22/2023   HGBA1C 6.3 (H) 10/27/2022   HGBA1C 5.9 (H) 03/31/2022   HGBA1C 6.7 (H) 11/09/2021  A1c has been at goal with diet/lifestyle efforts Discussed meds/tx options if A1c >7.0 No contraindications to GLP1       Relevant Orders  HgB A1c (Completed)   Urine Microalbumin w/creat. ratio (Completed)   Comprehensive metabolic panel with GFR (Completed)   Other Visit Diagnoses       Coronary artery disease involving native coronary artery of native heart without angina pectoris       no current exertional sx, on statin   Relevant Orders   Comprehensive metabolic panel with GFR (Completed)     Elevated LFTs       recheck labs, monitoring   Relevant  Orders   Comprehensive metabolic panel with GFR (Completed)     Left hip pain       antalgic gait worst first thing in the am, pain to outer upper hip, possibly bursitis vs IT band, offered PT assessment, xray, NSAID   Relevant Medications   meloxicam (MOBIC) 15 MG tablet   Other Relevant Orders   DG Hip Unilat W OR W/O Pelvis 2-3 Views Left (Completed)         Return for return for the CPE Sept.   Danelle Berry, PA-C 11/23/23 9:15 AM

## 2023-11-24 LAB — MICROALBUMIN / CREATININE URINE RATIO
Creatinine, Urine: 143 mg/dL (ref 20–275)
Microalb Creat Ratio: 7 mg/g{creat} (ref <30)
Microalb, Ur: 1 mg/dL

## 2023-11-24 LAB — COMPREHENSIVE METABOLIC PANEL WITH GFR
AG Ratio: 1.8 (calc) (ref 1.0–2.5)
ALT: 27 U/L (ref 6–29)
AST: 17 U/L (ref 10–35)
Albumin: 4.3 g/dL (ref 3.6–5.1)
Alkaline phosphatase (APISO): 94 U/L (ref 37–153)
BUN: 19 mg/dL (ref 7–25)
CO2: 30 mmol/L (ref 20–32)
Calcium: 9.7 mg/dL (ref 8.6–10.4)
Chloride: 106 mmol/L (ref 98–110)
Creat: 0.85 mg/dL (ref 0.60–1.00)
Globulin: 2.4 g/dL (ref 1.9–3.7)
Glucose, Bld: 104 mg/dL — ABNORMAL HIGH (ref 65–99)
Potassium: 4.5 mmol/L (ref 3.5–5.3)
Sodium: 144 mmol/L (ref 135–146)
Total Bilirubin: 0.4 mg/dL (ref 0.2–1.2)
Total Protein: 6.7 g/dL (ref 6.1–8.1)
eGFR: 73 mL/min/{1.73_m2} (ref >=60)

## 2023-11-24 LAB — HEMOGLOBIN A1C
Hgb A1c MFr Bld: 7.3 %{Hb} — ABNORMAL HIGH (ref <5.7)
Mean Plasma Glucose: 163 mg/dL
eAG (mmol/L): 9 mmol/L

## 2023-11-30 ENCOUNTER — Encounter: Payer: Self-pay | Admitting: Family Medicine

## 2023-11-30 MED ORDER — MELOXICAM 15 MG PO TABS: 15.0000 mg | ORAL_TABLET | Freq: Every day | ORAL | 0 refills | Status: DC

## 2023-11-30 NOTE — Assessment & Plan Note (Signed)
 Lab Results  Component Value Date   CHOL 143 05/22/2023   HDL 58 05/22/2023   LDLCALC 65 05/22/2023   TRIG 110 05/22/2023   CHOLHDL 2.5 05/22/2023   Lipids checked sept, good compliance, no SE or concerns, no med changes, cont atorvastatin

## 2023-11-30 NOTE — Assessment & Plan Note (Signed)
 On statin.

## 2023-11-30 NOTE — Assessment & Plan Note (Signed)
 No current sx or recent exacerbations, still smoking, working on cutting back

## 2023-11-30 NOTE — Assessment & Plan Note (Signed)
   Lab Results  Component Value Date   HGBA1C 6.6 (H) 05/22/2023   HGBA1C 6.3 (H) 10/27/2022   HGBA1C 5.9 (H) 03/31/2022   HGBA1C 6.7 (H) 11/09/2021  A1c has been at goal with diet/lifestyle efforts Discussed meds/tx options if A1c >7.0 No contraindications to GLP1

## 2023-12-01 MED ORDER — METFORMIN HCL 500 MG PO TABS: 500.0000 mg | ORAL_TABLET | Freq: Two times a day (BID) | ORAL | 1 refills | Status: DC

## 2023-12-15 DIAGNOSIS — G4733 Obstructive sleep apnea (adult) (pediatric): Secondary | ICD-10-CM | POA: Diagnosis not present

## 2023-12-20 ENCOUNTER — Other Ambulatory Visit: Payer: Self-pay

## 2023-12-20 ENCOUNTER — Telehealth: Payer: Self-pay | Admitting: *Deleted

## 2023-12-20 DIAGNOSIS — Z87891 Personal history of nicotine dependence: Secondary | ICD-10-CM

## 2023-12-20 DIAGNOSIS — F17210 Nicotine dependence, cigarettes, uncomplicated: Secondary | ICD-10-CM

## 2023-12-20 DIAGNOSIS — Z122 Encounter for screening for malignant neoplasm of respiratory organs: Secondary | ICD-10-CM

## 2023-12-20 NOTE — Telephone Encounter (Signed)
 Results sent to patient. Annual order placed. Results sent to PCP.

## 2023-12-20 NOTE — Telephone Encounter (Signed)
 Per Dara Ear, NP: [facility] to send results to patient. Splenic anuerysm was noted to be 12mm. Vascular consult is not advised unless measuring 2 cm or greater.

## 2023-12-21 ENCOUNTER — Telehealth: Payer: Self-pay

## 2023-12-21 DIAGNOSIS — D352 Benign neoplasm of pituitary gland: Secondary | ICD-10-CM

## 2023-12-21 NOTE — Telephone Encounter (Signed)
 Earnest Goad, PA from Dr. Margie Sheller would like labs reordered prior to surgery call   PA CB: [phone], main office : [phone]

## 2023-12-22 ENCOUNTER — Telehealth: Payer: Self-pay | Admitting: Internal Medicine

## 2023-12-22 NOTE — Telephone Encounter (Signed)
 Bianca Bud, PA-C said she was calling on behalf of Dr. Ofelia Bent at [facility]  [place] states that Dr. Ofelia Bent would like repeat labs done in [facility] and follow up done by Dr. Rosalea Collin because he, Dr. Ofelia Bent, is not an endocrinologist.  Primitivo Brooke states that if and when Dr. Rosalea Collin and Dr. Ofelia Bent gives the go-ahead, then the patient will be contacted to schedule surgery to be done by Dr. Ofelia Bent.  Primitivo Brooke states she will be in the OR on Monday, 12/25/2023, but her cell can be called first [phone]).  There is no voicemail, so if she does not answer the main number [phone]) can be called and message left with staff if neither [facility] or Dr. Ofelia Bent are unavailable.

## 2023-12-25 ENCOUNTER — Other Ambulatory Visit: Payer: Self-pay

## 2023-12-25 ENCOUNTER — Other Ambulatory Visit: Payer: Self-pay | Admitting: Family Medicine

## 2023-12-25 DIAGNOSIS — D352 Benign neoplasm of pituitary gland: Secondary | ICD-10-CM

## 2023-12-25 MED ORDER — DEXAMETHASONE 1 MG PO TABS
1.0000 mg | ORAL_TABLET | Freq: Once | ORAL | 0 refills | Status: AC
Start: 2023-12-25 — End: 2023-12-25

## 2023-12-25 NOTE — Telephone Encounter (Signed)
 Patient will callback with the fax number for [facility] to have the test completed with their lab

## 2023-12-25 NOTE — Telephone Encounter (Signed)
Labs have been faxed to [facility]

## 2023-12-25 NOTE — Telephone Encounter (Signed)
 Patient has been scheduled for 12/28/23.

## 2023-12-27 NOTE — Telephone Encounter (Signed)
 Rx 12/01/23 #60 1RF- to increased dose- may not need 90 days- will refuse Requested Prescriptions  Pending Prescriptions Disp Refills   metFORMIN  (GLUCOPHAGE ) 500 MG tablet [Pharmacy Med Name: METFORMIN  HCL 500 MG TABLET] 180 tablet 1    Sig: TAKE 1 TABLET BY MOUTH 2 TIMES DAILY WITH A MEAL.     Endocrinology:  Diabetes - Biguanides Failed - 12/27/2023  8:59 AM      Failed - B12 Level in normal range and within 720 days    No results found for: "VITAMINB12"       Passed - Cr in normal range and within 360 days    Creat  Date Value Ref Range Status  11/23/2023 0.85 0.60 - 1.00 mg/dL Final   Creatinine, Urine  Date Value Ref Range Status  11/23/2023 143 20 - 275 mg/dL Final         Passed - HBA1C is between 0 and 7.9 and within 180 days    Hgb A1c MFr Bld  Date Value Ref Range Status  11/23/2023 7.3 (H) <5.7 % of total Hgb Final    Comment:    For someone without known diabetes, a hemoglobin A1c value of 6.5% or greater indicates that they may have  diabetes and this should be confirmed with a follow-up  test. . For someone with known diabetes, a value <7% indicates  that their diabetes is well controlled and a value  greater than or equal to 7% indicates suboptimal  control. A1c targets should be individualized based on  duration of diabetes, age, comorbid conditions, and  other considerations. . Currently, no consensus exists regarding use of hemoglobin A1c for diagnosis of diabetes for children. .          Passed - eGFR in normal range and within 360 days    GFR, Est African American  Date Value Ref Range Status  05/06/2020 73 > OR = 60 mL/min/1.68m2 Final   GFR, Est Non African American  Date Value Ref Range Status  05/06/2020 63 > OR = 60 mL/min/1.93m2 Final   GFR, Estimated  Date Value Ref Range Status  09/14/2022 >60 >60 mL/min Final    Comment:    (NOTE) Calculated using the CKD-EPI Creatinine Equation (2021) Performed at [facility], [address] [place], [place] [address]    eGFR  Date Value Ref Range Status  11/23/2023 73 > OR = 60 mL/min/1.84m2 Final         Passed - Valid encounter within last 6 months    Recent Outpatient Visits           1 month ago New onset type 2 diabetes mellitus (HCC)   [facility] Adeline Hone, PA-C       Future Appointments             In 4 months Adeline Hone, PA-C [facility]            Passed - CBC within normal limits and completed in the last 12 months    WBC  Date Value Ref Range Status  05/22/2023 8.6 3.8 - 10.8 Thousand/uL Final   RBC  Date Value Ref Range Status  05/22/2023 4.86 3.80 - 5.10 Million/uL Final   Hemoglobin  Date Value Ref Range Status  05/22/2023 15.9 (H) 11.7 - 15.5 g/dL Final   HCT  Date Value Ref Range Status  05/22/2023 48.2 (H) 35.0 - 45.0 % Final   MCHC  Date Value Ref Range Status  05/22/2023 33.0 32.0 - 36.0 g/dL Final    Comment:    For adults, a slight decrease in the calculated MCHC value (in the range of 30 to 32 g/dL) is most likely not clinically significant; however, it should be interpreted with caution in correlation with other red cell parameters and the patient's clinical condition.    [facility]  Date Value Ref Range Status  05/22/2023 32.7 27.0 - 33.0 pg Final   MCV  Date Value Ref Range Status  05/22/2023 99.2 80.0 - 100.0 fL Final   No results found for: "PLTCOUNTKUC", "LABPLAT", "POCPLA" RDW  Date Value Ref Range Status  05/22/2023 12.0 11.0 - 15.0 % Final

## 2023-12-28 ENCOUNTER — Other Ambulatory Visit

## 2023-12-28 ENCOUNTER — Other Ambulatory Visit: Payer: Self-pay | Admitting: Family Medicine

## 2023-12-28 ENCOUNTER — Other Ambulatory Visit: Payer: Self-pay | Admitting: Internal Medicine

## 2023-12-28 DIAGNOSIS — D352 Benign neoplasm of pituitary gland: Secondary | ICD-10-CM | POA: Diagnosis not present

## 2023-12-28 DIAGNOSIS — M25552 Pain in left hip: Secondary | ICD-10-CM

## 2023-12-29 ENCOUNTER — Encounter: Payer: Self-pay | Admitting: Internal Medicine

## 2023-12-29 NOTE — Telephone Encounter (Signed)
 Requested medication (s) are due for refill today: yes  Requested medication (s) are on the active medication list: yes  Last refill:  11/30/23 #30  Future visit scheduled: yes  Notes to clinic:  overdue lab work    Requested Prescriptions  Pending Prescriptions Disp Refills   meloxicam  (MOBIC ) 15 MG tablet [Pharmacy Med Name: MELOXICAM  15 MG TABLET] 30 tablet 0    Sig: Take 1 tablet (15 mg total) by mouth daily.     Analgesics:  COX2 Inhibitors Failed - 12/29/2023  2:33 PM      Failed - Manual Review: Labs are only required if the patient has taken medication for more than 8 weeks.      Failed - HGB in normal range and within 360 days    Hemoglobin  Date Value Ref Range Status  05/22/2023 15.9 (H) 11.7 - 15.5 g/dL Final         Failed - HCT in normal range and within 360 days    HCT  Date Value Ref Range Status  05/22/2023 48.2 (H) 35.0 - 45.0 % Final         Passed - Cr in normal range and within 360 days    Creat  Date Value Ref Range Status  11/23/2023 0.85 0.60 - 1.00 mg/dL Final   Creatinine, Urine  Date Value Ref Range Status  11/23/2023 143 20 - 275 mg/dL Final         Passed - AST in normal range and within 360 days    AST  Date Value Ref Range Status  11/23/2023 17 10 - 35 U/L Final         Passed - ALT in normal range and within 360 days    ALT  Date Value Ref Range Status  11/23/2023 27 6 - 29 U/L Final         Passed - eGFR is 30 or above and within 360 days    GFR, Est African American  Date Value Ref Range Status  05/06/2020 73 > OR = 60 mL/min/1.97m2 Final   GFR, Est Non African American  Date Value Ref Range Status  05/06/2020 63 > OR = 60 mL/min/1.73m2 Final   GFR, Estimated  Date Value Ref Range Status  09/14/2022 >60 >60 mL/min Final    Comment:    (NOTE) Calculated using the CKD-EPI Creatinine Equation (2021) Performed at [facility], [address] [place], [place] [address]    eGFR  Date Value Ref Range Status   11/23/2023 73 > OR = 60 mL/min/1.24m2 Final         Passed - Patient is not pregnant      Passed - Valid encounter within last 12 months    Recent Outpatient Visits           1 month ago New onset type 2 diabetes mellitus [facility])   [facility] Adeline Hone, PA-C       Future Appointments             In 4 months Adeline Hone, PA-C [facility]

## 2024-01-04 ENCOUNTER — Other Ambulatory Visit: Payer: Self-pay

## 2024-01-04 ENCOUNTER — Encounter: Payer: Self-pay | Admitting: Family Medicine

## 2024-01-04 DIAGNOSIS — E119 Type 2 diabetes mellitus without complications: Secondary | ICD-10-CM

## 2024-01-04 MED ORDER — METFORMIN HCL 500 MG PO TABS: 500.0000 mg | ORAL_TABLET | Freq: Two times a day (BID) | ORAL | 0 refills | Status: DC

## 2024-01-05 ENCOUNTER — Ambulatory Visit: Payer: Self-pay

## 2024-01-05 ENCOUNTER — Telehealth: Payer: Self-pay

## 2024-01-05 LAB — DEXAMETHASONE, BLOOD: Dexamethasone, Blood: 237 ng/dL

## 2024-01-05 LAB — CORTISOL: Cortisol: 13.8 ug/dL (ref 6.2–19.4)

## 2024-01-05 NOTE — Telephone Encounter (Signed)
 Lab results sent to Neurosurgeon as directed by MD.

## 2024-01-14 DIAGNOSIS — G4733 Obstructive sleep apnea (adult) (pediatric): Secondary | ICD-10-CM | POA: Diagnosis not present

## 2024-02-13 DIAGNOSIS — G4733 Obstructive sleep apnea (adult) (pediatric): Secondary | ICD-10-CM | POA: Diagnosis not present

## 2024-02-17 DIAGNOSIS — G4733 Obstructive sleep apnea (adult) (pediatric): Secondary | ICD-10-CM | POA: Diagnosis not present

## 2024-03-14 DIAGNOSIS — G4733 Obstructive sleep apnea (adult) (pediatric): Secondary | ICD-10-CM | POA: Diagnosis not present

## 2024-04-02 ENCOUNTER — Encounter: Payer: Self-pay | Admitting: Family Medicine

## 2024-04-03 ENCOUNTER — Other Ambulatory Visit: Payer: Self-pay

## 2024-04-03 ENCOUNTER — Other Ambulatory Visit: Payer: Self-pay | Admitting: Family Medicine

## 2024-04-03 DIAGNOSIS — E119 Type 2 diabetes mellitus without complications: Secondary | ICD-10-CM

## 2024-04-03 MED ORDER — CONTOUR NEXT TEST VI STRP: ORAL_STRIP | 5 refills | Status: DC

## 2024-04-03 MED ORDER — MICROLET LANCETS MISC: 1.0000 | 6 refills | Status: AC | PRN

## 2024-04-04 ENCOUNTER — Ambulatory Visit (INDEPENDENT_AMBULATORY_CARE_PROVIDER_SITE_OTHER): Payer: BC Managed Care – PPO | Admitting: Internal Medicine

## 2024-04-04 ENCOUNTER — Encounter: Payer: Self-pay | Admitting: Internal Medicine

## 2024-04-04 VITALS — BP 118/72 | HR 97 | Ht 66.0 in | Wt 226.0 lb

## 2024-04-04 DIAGNOSIS — E240 Pituitary-dependent Cushing's disease: Secondary | ICD-10-CM | POA: Diagnosis not present

## 2024-04-04 DIAGNOSIS — D352 Benign neoplasm of pituitary gland: Secondary | ICD-10-CM

## 2024-04-04 NOTE — Progress Notes (Signed)
 Name: Kathy Gregory  MRN/ DOB: [id number], 07-Apr-1951    Age/ Sex: 73 y.o., female    PCP: Leavy Mole, PA-C   Reason for Endocrinology Evaluation: Pituitary macroadenoma     Date of Initial Endocrinology Evaluation: 08/03/2023    HPI: [name] is a 73 y.o. female with a past medical history of DM, dyslipidemia, obesity and pituitary adenoma. The patient presented for initial endocrinology clinic visit on 08/03/2023 for consultative assistance with her pituitary macroadenoma.   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  Repeat MRI 03/27/2023 showed unchanged cystic lesion in the left aspect of the sella, measuring up to 12 x 10 x 12 mm with internal septation, most consistent with a cystic pituitary adenoma.  A normal posterior pituitary bright spot is seen.  The infundibulum is deviated to the right.  In reviewing her records she was noted with a 29.2 NG/mL prolactin level on 08/2022 but this was normalized by 10/2022 at 13.4 NG/mL.  TFTs normal as of 04/2023  Mother with pituitary adenoma, required sx and requiring hormone replacement therapy    Saliva cortisol testing was abnormal January, 2024 at 0.12 x2 (reference <0.09)  Dexamethasone  suppression test was elevated 7.2 in January, 2025  24-hour urinary cortisol was also elevated at 62 UG/24  Repeat dexamethasone  suppression test in May, 2025 continue to show an elevated cortisol level of 13.8 UG/DL with dexamethasone  blood level of 237  IGF-I was normal Baseline ACTH  41  I had referred her to [facility] neurosurgery, but somehow she ended up with [facility]   SUBJECTIVE:    Today (04/04/24): Kathy Gregory is here for follow-up on pituitary adenoma   Substantial weight gain-weight  continues to fluctuate Severe hypertension- no  DM- yes - A1c is trending up and started Metformin  11/2023 Proximal muscle weakness- Sudden/ severe headaches- rare headaches but had a recent migraine headache with aura  Palpitations- occasionally Fluid retention- Yes-ankle swelling which has resolved with metformin  intake  Hypokalemia-no Bedtime - 11-11:30 pm  NO change in shoe or ring size  No vision changes, last eye exam 04/2023 . Has a visual field testing  She developed diarrhea to metformin  initially but this has resolved  No nausea or vomiting     HISTORY:  Past Medical History:  Past Medical History:  Diagnosis Date   Advanced care planning/counseling discussion 05/22/2023   Anxiety    Arthritis    hips, collarbone   Cataract    surgery in may 2021   Coronary artery calcification    Depression    Diabetes (HCC)    Emphysema of lung (HCC)    Heart murmur 07/24/2016   Hyperlipidemia    Migraines    4-5 per year   Osteoporosis    Other fatigue 12/29/2017   Scoliosis    mild   Sleep apnea    Vertigo    2-3x/yr   Past Surgical History:  Past Surgical History:  Procedure Laterality Date   BREAST BIOPSY Left 07/07/2023   Stereo bx, ribbon clip, path pending   BREAST BIOPSY Left 07/07/2023   MM LT BREAST BX W LOC DEV 1ST LESION IMAGE BX SPEC STEREO GUIDE 07/07/2023 [facility]   BREAST CYST ASPIRATION Left    COLONOSCOPY  2006  COLONOSCOPY WITH PROPOFOL  N/A 05/15/2015   Procedure: COLONOSCOPY WITH PROPOFOL ;  Surgeon: Rogelia Copping, MD;  Location: [facility];  Service: Endoscopy;  Laterality: N/A;   COLONOSCOPY WITH PROPOFOL  N/A 06/02/2020   Procedure: COLONOSCOPY WITH PROPOFOL ;  Surgeon: Copping Rogelia, MD;  Location: [facility];  Service: Endoscopy;  Laterality: N/A;   EYE SURGERY     POLYPECTOMY  05/15/2015   Procedure: POLYPECTOMY;  Surgeon: Rogelia Copping, MD;  Location: [facility];  Service: Endoscopy;;    Social History:  reports that  she has been smoking cigarettes. She has a 23.5 pack-year smoking history. She has never used smokeless tobacco. She reports current alcohol use of about 2.0 standard drinks of alcohol per week. She reports that she does not use drugs. Family History: family history includes Atrial fibrillation in her brother; Breast cancer in her paternal grandmother; Cirrhosis in her brother; Diabetes in her brother and mother; Heart disease (age of onset: 57) in her father; Hypertension in her brother and mother; Kidney disease in her maternal grandmother; Stroke (age of onset: 26) in her father.   HOME MEDICATIONS: Allergies as of 04/04/2024   No Known Allergies      Medication List        Accurate as of April 04, 2024 11:30 AM. If you have any questions, ask your nurse or doctor.          atorvastatin  20 MG tablet Commonly known as: LIPITOR TAKE 1 TABLET BY MOUTH EVERYDAY AT BEDTIME   blood glucose meter kit and supplies Kit Dispense based on patient and insurance preference. Use up to four times daily as directed. (FOR ICD-9 250.00, 250.01).   CALCIUM  & VIT D3 BONE HEALTH PO Take 2 tablets by mouth daily. Calcium  600mg - Vitamin D 400mg    clonazePAM  0.5 MG tablet Commonly known as: KLONOPIN  Take 0.5-1 tablets (0.25-0.5 mg total) by mouth daily as needed for anxiety. Never take within six hours of alcohol   Contour Next Test test strip Generic drug: glucose blood Use as instructed   Fish Oil + D3 1000-1000 MG-UNIT Caps Take by mouth daily.   meloxicam  15 MG tablet Commonly known as: MOBIC  TAKE 1 TABLET (15 MG TOTAL) BY MOUTH DAILY.   metFORMIN  500 MG tablet Commonly known as: GLUCOPHAGE  Take 1 tablet (500 mg total) by mouth 2 (two) times daily with a meal.   Microlet Lancets Misc 1 each by Does not apply route as needed.   psyllium 0.52 g capsule Commonly known as: REGULOID Take 0.52 g by mouth daily.   traZODone  50 MG tablet Commonly known as: DESYREL  TAKE 1 TO 2 TABLETS  BY MOUTH AT BEDTIME AS NEEDED FOR SLEEP          REVIEW OF SYSTEMS: A comprehensive ROS was conducted with the patient and is negative except as per HPI   OBJECTIVE:  VS: BP 118/72 (BP Location: Left Arm, Patient Position: Sitting, Cuff Size: Normal)   Pulse 97   Ht 5' 6 (1.676 m)   Wt 226 lb (102.5 kg)   SpO2 98%   BMI 36.48 kg/m    Wt Readings from Last 3 Encounters:  04/04/24 226 lb (102.5 kg)  11/23/23 220 lb (99.8 kg)  09/25/23 222 lb 9.6 oz (101 kg)     EXAM: General: Pt appears well and is in NAD Patient with fat deposits at the base of the neck  Eyes: External eye exam normal without stare, lid lag or exophthalmos.  EOM intact.  Neck:  Thyroid : Thyroid  size normal.  No goiter or nodules appreciated.  Lungs: Clear with good BS bilat   Heart: Auscultation: RRR.  Abdomen: Soft, nontender, faint lower abdominal wall striae   Extremities:  BL LE: trace pretibial edema   Mental Status: Judgment, insight: Intact Orientation: Oriented to time, place, and person Mood and affect: No depression, anxiety, or agitation     DATA REVIEWED:   Latest Reference Range & Units 04/04/24 12:07  Sodium 135 - 146 mmol/L 141  Potassium 3.5 - 5.3 mmol/L 4.1  Chloride 98 - 110 mmol/L 106  CO2 20 - 32 mmol/L 24  Glucose 65 - 99 mg/dL 99  BUN 7 - 25 mg/dL 16  Creatinine 9.39 - 8.99 mg/dL 9.26  Calcium  8.6 - 10.4 mg/dL 9.7  BUN/Creatinine Ratio 6 - 22 (calc) SEE NOTE:  eGFR > OR = 60 mL/min/1.39m2 88     Brain MRI 03/27/2023 FINDINGS: Brain: No acute infarct or hemorrhage. No hydrocephalus or extra-axial collection. No foci of abnormal susceptibility.   Pituitary/Sella: Unchanged cystic lesion in the left aspect of the sella, measuring up to 12 x 10 x 12 mm (sagittal image 7 series 25, coronal image 8 series 24) with internal septation (coronal image 7 series 24), most consistent with a cystic pituitary adenoma. A normal posterior pituitary bright spot is seen. The  infundibulum is deviated to the right. The hypothalamus and mamillary bodies are normal. There is no mass effect on the optic chiasm or optic nerves. The infundibular and chiasmatic recesses are clear. Normal cavernous sinus and cavernous internal carotid artery flow voids.   Vascular: Normal flow voids and vessel enhancement.   Skull and upper cervical spine: Normal marrow signal and enhancement.   Sinuses/Orbits: No acute findings.   Other: None.   IMPRESSION: Unchanged cystic lesion in the left aspect of the sella, measuring up to 12 mm with internal septation, most consistent with a cystic pituitary adenoma.      ASSESSMENT/PLAN/RECOMMENDATIONS:   Pituitary macroadenoma  -This was diagnosed on MRI 08/2022, repeat MRI 03/2023 showed unchanged cystic lesion of the left aspect of the sella, measuring up to 12 mm with internal septation. -I have emphasized the importance of having visual field testing on a regular basis -Historically she has had normal ACTH , serum cortisol, prolactin, IGF-I and TFTs - She did have abnormal cortisol testing as below    2. Cushing disease:   -The patient does have clinical features of Cushing syndrome to include central obesity, fat pad at the base of the neck, lower extremity edema, and worsening glucose control requiring initiation of glycemic agents through her PCPs office - The patient has had abnormal saliva cortisol testing x 2 - She did have abnormal 24-hour urinary cortisol - She also had abnormal dexamethasone  suppression test x 2 - I did refer her to [facility] neurosurgery, but somehow they did not proceed with surgical intervention - I have recommended proceeding with saliva cortisol testing again, if this is confirmed again to be abnormal will refer to neurosurgery   Follow-up in 6 months  Signed electronically by: Stefano Redgie Butts, MD  [facility] Endocr[facility]ology  [facility] [address] [place] [place], [place] [address] Phone: [phone] FAX: [phone]   CC: Leavy Michelene RIGGERS [address] [place] [address] Phone: [phone] Fax: [phone]   Return to Endocrinology clinic as below: Future Appointments  Date Time Provider Department Center  05/23/2024  8:20 AM Leavy Michelene, PA-C [facility]  [facility]

## 2024-04-04 NOTE — Patient Instructions (Signed)
 SALIVARY CORTISOL COLLECTION INSTRUCTIONS    Precautions:  Please collect sample at Midnight You will need to do this on 2 nights  No food or fluids 30 minutes prior to collection.  Do not use any creams, lotions on hands, or use steroid inhalers 24- hours prior to collection.  Wash hands carefully.  Avoid any activity that could cause your gums to bleed: including flushing of brushing your teeth.  Kit must not be used in children less than 72 years of age, or a person that is at risk for choking on collection kit.  Instructions for saliva collection:   Rinse mouth thoroughly with water and discard. Do not swallow.  Hold the Salivette at the rim of the suspended insert and remove the stopper.  Remove the swab.  Place swab under tongue until well saturated, approximately 1 minute.   Return the saturated swab to the suspended insert and close the Salivette firmly with the stopper.  Do not remove the tube holding the insert. The Salivette should be sent to the lab with the swab.   Come to the lab and leave the Salivette kit for labeling with your identifying information.   Make sure you refrigerate sample if not bringing to the lab immediately. Try to use cold packs for transportation if available.

## 2024-04-05 ENCOUNTER — Other Ambulatory Visit: Payer: Self-pay

## 2024-04-05 ENCOUNTER — Ambulatory Visit: Payer: Self-pay | Admitting: Internal Medicine

## 2024-04-05 DIAGNOSIS — E119 Type 2 diabetes mellitus without complications: Secondary | ICD-10-CM

## 2024-04-05 DIAGNOSIS — D352 Benign neoplasm of pituitary gland: Secondary | ICD-10-CM

## 2024-04-05 MED ORDER — CONTOUR NEXT TEST VI STRP
1.0000 | ORAL_STRIP | Freq: Four times a day (QID) | 1 refills | Status: AC
Start: 2024-04-05 — End: ?

## 2024-04-07 LAB — BASIC METABOLIC PANEL WITH GFR
BUN: 16 mg/dL (ref 7–25)
CO2: 24 mmol/L (ref 20–32)
Calcium: 9.7 mg/dL (ref 8.6–10.4)
Chloride: 106 mmol/L (ref 98–110)
Creat: 0.73 mg/dL (ref 0.60–1.00)
Glucose, Bld: 99 mg/dL (ref 65–99)
Potassium: 4.1 mmol/L (ref 3.5–5.3)
Sodium: 141 mmol/L (ref 135–146)
eGFR: 88 mL/min/1.73m2 (ref >=60)

## 2024-04-07 LAB — ACTH: C206 ACTH: 58 pg/mL — ABNORMAL HIGH (ref 6–50)

## 2024-04-07 LAB — CORTISOL: Cortisol, Plasma: 16.2 ug/dL

## 2024-04-09 DIAGNOSIS — E240 Pituitary-dependent Cushing's disease: Secondary | ICD-10-CM | POA: Diagnosis not present

## 2024-04-10 DIAGNOSIS — E240 Pituitary-dependent Cushing's disease: Secondary | ICD-10-CM | POA: Diagnosis not present

## 2024-04-11 ENCOUNTER — Other Ambulatory Visit

## 2024-04-11 ENCOUNTER — Other Ambulatory Visit: Payer: Self-pay | Admitting: Family Medicine

## 2024-04-11 DIAGNOSIS — G4700 Insomnia, unspecified: Secondary | ICD-10-CM

## 2024-04-12 NOTE — Telephone Encounter (Signed)
 Requested Prescriptions  Pending Prescriptions Disp Refills   traZODone  (DESYREL ) 50 MG tablet [Pharmacy Med Name: TRAZODONE  50 MG TABLET] 180 tablet 0    Sig: TAKE 1 TO 2 TABLETS BY MOUTH AT BEDTIME AS NEEDED FOR SLEEP     Psychiatry: Antidepressants - Serotonin Modulator Passed - 04/12/2024  8:17 AM      Passed - Valid encounter within last 6 months    Recent Outpatient Visits           4 months ago New onset type 2 diabetes mellitus [facility])   [place] [facility] Leavy Mole, PA-C       Future Appointments             In 1 month Leavy Mole, PA-C [facility]

## 2024-04-14 DIAGNOSIS — G4733 Obstructive sleep apnea (adult) (pediatric): Secondary | ICD-10-CM | POA: Diagnosis not present

## 2024-04-20 ENCOUNTER — Other Ambulatory Visit: Payer: Self-pay | Admitting: Family Medicine

## 2024-04-20 DIAGNOSIS — E119 Type 2 diabetes mellitus without complications: Secondary | ICD-10-CM

## 2024-04-21 LAB — CORTISOL, SALIVARY
Cortisol, Saliva: 0.08 ug/dL
Cortisol, Saliva: 0.12 ug/dL

## 2024-04-23 NOTE — Telephone Encounter (Signed)
 Requested Prescriptions  Pending Prescriptions Disp Refills   metFORMIN  (GLUCOPHAGE ) 500 MG tablet [Pharmacy Med Name: METFORMIN  HCL 500 MG TABLET] 180 tablet 0    Sig: TAKE 1 TABLET BY MOUTH 2 TIMES DAILY WITH A MEAL.     Endocrinology:  Diabetes - Biguanides Failed - 04/23/2024  8:16 AM      Failed - B12 Level in normal range and within 720 days    No results found for: VITAMINB12       Passed - Cr in normal range and within 360 days    Creat  Date Value Ref Range Status  04/04/2024 0.73 0.60 - 1.00 mg/dL Final   Creatinine, Urine  Date Value Ref Range Status  11/23/2023 143 20 - 275 mg/dL Final         Passed - HBA1C is between 0 and 7.9 and within 180 days    Hgb A1c MFr Bld  Date Value Ref Range Status  11/23/2023 7.3 (H) <5.7 % of total Hgb Final    Comment:    For someone without known diabetes, a hemoglobin A1c value of 6.5% or greater indicates that they may have  diabetes and this should be confirmed with a follow-up  test. . For someone with known diabetes, a value <7% indicates  that their diabetes is well controlled and a value  greater than or equal to 7% indicates suboptimal  control. A1c targets should be individualized based on  duration of diabetes, age, comorbid conditions, and  other considerations. . Currently, no consensus exists regarding use of hemoglobin A1c for diagnosis of diabetes for children. .          Passed - eGFR in normal range and within 360 days    GFR, Est African American  Date Value Ref Range Status  05/06/2020 73 > OR = 60 mL/min/1.79m2 Final   GFR, Est Non African American  Date Value Ref Range Status  05/06/2020 63 > OR = 60 mL/min/1.34m2 Final   GFR, Estimated  Date Value Ref Range Status  09/14/2022 >60 >60 mL/min Final    Comment:    (NOTE) Calculated using the CKD-EPI Creatinine Equation (2021) Performed at [facility], [address] [place], [place] [address]    eGFR  Date Value Ref Range  Status  04/04/2024 88 > OR = 60 mL/min/1.68m2 Final         Passed - Valid encounter within last 6 months    Recent Outpatient Visits           5 months ago New onset type 2 diabetes mellitus (HCC)   [place] [facility] Leavy Mole, PA-C       Future Appointments             In 1 month Leavy Mole, PA-C [facility], [place]            Passed - CBC within normal limits and completed in the last 12 months    WBC  Date Value Ref Range Status  05/22/2023 8.6 3.8 - 10.8 Thousand/uL Final   RBC  Date Value Ref Range Status  05/22/2023 4.86 3.80 - 5.10 Million/uL Final   Hemoglobin  Date Value Ref Range Status  05/22/2023 15.9 (H) 11.7 - 15.5 g/dL Final   HCT  Date Value Ref Range Status  05/22/2023 48.2 (H) 35.0 - 45.0 % Final   MCHC  Date Value Ref Range Status  05/22/2023 33.0 32.0 - 36.0 g/dL Final  Comment:    For adults, a slight decrease in the calculated MCHC value (in the range of 30 to 32 g/dL) is most likely not clinically significant; however, it should be interpreted with caution in correlation with other red cell parameters and the patient's clinical condition.    [facility]  Date Value Ref Range Status  05/22/2023 32.7 27.0 - 33.0 pg Final   MCV  Date Value Ref Range Status  05/22/2023 99.2 80.0 - 100.0 fL Final   No results found for: PLTCOUNTKUC, LABPLAT, POCPLA RDW  Date Value Ref Range Status  05/22/2023 12.0 11.0 - 15.0 % Final

## 2024-04-24 MED ORDER — DEXAMETHASONE 1 MG PO TABS: 1.0000 mg | ORAL_TABLET | Freq: Once | ORAL | 0 refills | Status: AC

## 2024-04-24 NOTE — Telephone Encounter (Signed)
 Can you please contact the patient and let her know that 1 of the salivary  test results came back normal but the other one came back high.    I would like to proceed with dexamethasone  suppression test   Instructions for Dexamethasone  Suppression Test   Step 1: Choose a morning when you can come to our lab at 8:00 am for a blood draw.   Step 2: On the night before the blood draw, take one 1 mg tablet of dexamethasone  at 11:30 pm.  The timing is VERY important!   Step 3: The next morning, go to the lab for blood work at 8:00 am.  Rosine do not have to be on an empty stomach, but the timing is VERY important!    A prescription for dexamethasone  1 mg was sent to the pharmacy   Please schedule the patient for fasting, 8 AM labs   Thanks

## 2024-05-02 ENCOUNTER — Other Ambulatory Visit

## 2024-05-02 DIAGNOSIS — D352 Benign neoplasm of pituitary gland: Secondary | ICD-10-CM | POA: Diagnosis not present

## 2024-05-10 ENCOUNTER — Other Ambulatory Visit: Payer: Self-pay | Admitting: Family Medicine

## 2024-05-10 DIAGNOSIS — I700 Atherosclerosis of aorta: Secondary | ICD-10-CM

## 2024-05-10 DIAGNOSIS — I2510 Atherosclerotic heart disease of native coronary artery without angina pectoris: Secondary | ICD-10-CM

## 2024-05-10 DIAGNOSIS — E785 Hyperlipidemia, unspecified: Secondary | ICD-10-CM

## 2024-05-10 NOTE — Telephone Encounter (Signed)
 Requested Prescriptions  Pending Prescriptions Disp Refills   atorvastatin  (LIPITOR) 20 MG tablet [Pharmacy Med Name: ATORVASTATIN  20 MG TABLET] 90 tablet 0    Sig: TAKE 1 TABLET BY MOUTH EVERYDAY AT BEDTIME     Cardiovascular:  Antilipid - Statins Failed - 05/10/2024  2:40 PM      Failed - Lipid Panel in normal range within the last 12 months    Cholesterol  Date Value Ref Range Status  05/22/2023 143 <200 mg/dL Final   LDL Cholesterol (Calc)  Date Value Ref Range Status  05/22/2023 65 mg/dL (calc) Final    Comment:    Reference range: <100 . Desirable range <100 mg/dL for primary prevention;   <70 mg/dL for patients with CHD or diabetic patients  with > or = 2 CHD risk factors. SABRA LDL-C is now calculated using the Martin-Hopkins  calculation, which is a validated novel method providing  better accuracy than the Friedewald equation in the  estimation of LDL-C.  Gladis APPLETHWAITE et al. SANDREA. 7986;689(80): 2061-2068  (http://education.[employer].com/faq/FAQ164)    HDL  Date Value Ref Range Status  05/22/2023 58 > OR = 50 mg/dL Final   Triglycerides  Date Value Ref Range Status  05/22/2023 110 <150 mg/dL Final         Passed - Patient is not pregnant      Passed - Valid encounter within last 12 months    Recent Outpatient Visits           5 months ago New onset type 2 diabetes mellitus [facility])   [place] [facility] Leavy Mole, PA-C       Future Appointments             In 1 week Leavy Mole, PA-C [facility], [place]

## 2024-05-11 LAB — DEXAMETHASONE, BLOOD: Dexamethasone, Serum: 191 ng/dL

## 2024-05-11 LAB — CORTISOL: Cortisol, Plasma: 11.6 ug/dL

## 2024-05-13 NOTE — Telephone Encounter (Addendum)
 Spoke to Kathy Gregory on 05/13/2024 with nonsuppressed cortisol level following dexamethasone  suppression test.  Dexamethasone  levels in the serum are elevated, confirming  proper intake of dexamethasone .    ACTH  was elevated on the last check up.  Will proceed with referral to neurosurgery   Patient in agreement of this plan

## 2024-05-15 DIAGNOSIS — G4733 Obstructive sleep apnea (adult) (pediatric): Secondary | ICD-10-CM | POA: Diagnosis not present

## 2024-05-19 DIAGNOSIS — G4733 Obstructive sleep apnea (adult) (pediatric): Secondary | ICD-10-CM | POA: Diagnosis not present

## 2024-05-23 ENCOUNTER — Encounter: Payer: Self-pay | Admitting: Family Medicine

## 2024-05-23 ENCOUNTER — Ambulatory Visit: Admitting: Family Medicine

## 2024-05-23 VITALS — BP 118/72 | HR 71 | Resp 16 | Ht 66.0 in | Wt 226.0 lb

## 2024-05-23 DIAGNOSIS — G4700 Insomnia, unspecified: Secondary | ICD-10-CM

## 2024-05-23 DIAGNOSIS — Z23 Encounter for immunization: Secondary | ICD-10-CM

## 2024-05-23 DIAGNOSIS — E119 Type 2 diabetes mellitus without complications: Secondary | ICD-10-CM | POA: Diagnosis not present

## 2024-05-23 DIAGNOSIS — Z0000 Encounter for general adult medical examination without abnormal findings: Secondary | ICD-10-CM | POA: Diagnosis not present

## 2024-05-23 DIAGNOSIS — E785 Hyperlipidemia, unspecified: Secondary | ICD-10-CM | POA: Diagnosis not present

## 2024-05-23 DIAGNOSIS — Z0001 Encounter for general adult medical examination with abnormal findings: Secondary | ICD-10-CM | POA: Diagnosis not present

## 2024-05-23 DIAGNOSIS — F410 Panic disorder [episodic paroxysmal anxiety] without agoraphobia: Secondary | ICD-10-CM

## 2024-05-23 DIAGNOSIS — L989 Disorder of the skin and subcutaneous tissue, unspecified: Secondary | ICD-10-CM

## 2024-05-23 DIAGNOSIS — Z1231 Encounter for screening mammogram for malignant neoplasm of breast: Secondary | ICD-10-CM

## 2024-05-23 MED ORDER — TRAZODONE HCL 50 MG PO TABS
50.0000 mg | ORAL_TABLET | Freq: Every evening | ORAL | 3 refills | Status: AC | PRN
Start: 2024-05-23 — End: ?

## 2024-05-23 MED ORDER — CLONAZEPAM 0.5 MG PO TABS: 0.2500 mg | ORAL_TABLET | Freq: Every day | ORAL | 0 refills | Status: AC | PRN

## 2024-05-23 NOTE — Progress Notes (Signed)
 Patient: Kathy Gregory, Female    DOB: 02/25/1951, 73 y.o.   MRN: [id number] Leavy Mole, PA-C Visit Date: 05/23/2024  Today's Provider: Mole Leavy, PA-C   Chief Complaint  Patient presents with   Annual Exam   Subjective:   Annual physical exam:  Kathy Gregory is a 73 y.o. female who presents today for complete physical exam:  Exercise/Activity:   none today Diet/nutrition:  no diet efforts, tries to eat healthy  Sleep:  no concerns  SDOH Screenings   Food Insecurity: No Food Insecurity (05/21/2024)  Housing: Low Risk  (05/21/2024)  Transportation Needs: No Transportation Needs (05/21/2024)  Utilities: Not At Risk (05/23/2024)  Alcohol Screen: Low Risk  (05/21/2024)  Depression (PHQ2-9): Low Risk  (05/23/2024)  Financial Resource Strain: Low Risk  (05/21/2024)  Physical Activity: Inactive (05/21/2024)  Social Connections: Socially Isolated (05/21/2024)  Stress: No Stress Concern Present (05/21/2024)  Tobacco Use: High Risk (05/23/2024)  Health Literacy: Adequate Health Literacy (05/23/2024)   Due for DM f/up, she updates me on specialists eval and neurosurgery referrals Discussed the use of AI scribe software for clinical note transcription with the patient, who gave verbal consent to proceed.  History of Present Illness Kathy Gregory is a 74 year old female who presents for follow-up on her endocrinological evaluation and management of elevated cortisol levels.  Hyperglycemia and cortisol abnormalities - Elevated cortisol levels with persistently high results on dexamethasone  suppression test - Saliva cortisol testing showed one normal and one elevated result - Upcoming neurosurgical evaluation in [facility] for further assessment of hypercortisolism - Elevated blood glucose levels trending upward since April - Metformin  500 mg twice daily initiated in April for glycemic control - Initial diarrhea with metformin , now tolerated without gastrointestinal side effects -  Dietary modifications include sugar-free options and monitoring sugar intake  Nocturia and urinary urgency - Nocturia, awakening twice nightly to urinate - Occasional urge incontinence, particularly when away from a bathroom - No stress incontinence  Facial lesion - Facial lesion described as bothersome and identified as an age spot - Concern regarding location and tendency to pick at the lesion - Considering further evaluation and possible removal  Gait disturbance and musculoskeletal symptoms - Difficulty with ambulation attributed to scoliosis - History of participation in chair exercises, considering resumption of activity  Supplement use - Takes vitamin D and calcium  supplements    USPSTF grade A and B recommendations - reviewed and addressed today  Depression:  Phq 9 completed today by patient, was reviewed by me with patient in the room PHQ score is neg, pt feels mood is good    05/23/2024    8:12 AM 05/22/2023   10:27 AM 03/20/2023    8:56 AM 10/06/2022   10:28 AM  PHQ 2/9 Scores  PHQ - 2 Score 0 0 0 0  PHQ- 9 Score  0 0 0      05/23/2024    8:12 AM 05/22/2023   10:27 AM 03/20/2023    8:56 AM 10/06/2022   10:28 AM 05/16/2022   10:10 AM  Depression screen PHQ 2/9  Decreased Interest 0 0 0 0 0  Down, Depressed, Hopeless 0 0 0 0 0  PHQ - 2 Score 0 0 0 0 0  Altered sleeping  0 0 0 0  Tired, decreased energy  0 0 0 0  Change in appetite  0 0 0 0  Feeling bad or failure about yourself   0 0 0 0  Trouble concentrating  0 0 0 0  Moving slowly or fidgety/restless  0 0 0 0  Suicidal thoughts  0 0 0 0  PHQ-9 Score  0 0 0 0  Difficult doing work/chores  Not difficult at all Not difficult at all Not difficult at all Not difficult at all    Alcohol screening: Flowsheet Row Office Visit from 05/23/2024 in [facility]  AUDIT-C Score 2     Immunizations and Health Maintenance: Health Maintenance  Topic Date Due   OPHTHALMOLOGY EXAM  12/19/2023    FOOT EXAM  05/21/2024   Mammogram  06/18/2024   COVID-19 Vaccine (8 - 2024-25 season) 06/08/2024 (Originally 04/22/2024)   HEMOGLOBIN A1C  05/24/2024   Lung Cancer Screening  11/02/2024   Diabetic kidney evaluation - Urine ACR  11/22/2024   Diabetic kidney evaluation - eGFR measurement  04/04/2025   Colonoscopy  06/02/2025   DEXA SCAN  08/02/2025   DTaP/Tdap/Td (3 - Td or Tdap) 03/31/2032   Pneumococcal Vaccine: 50+ Years  Completed   Influenza Vaccine  Completed   Hepatitis C Screening  Completed   Zoster Vaccines- Shingrix   Completed   HPV VACCINES  Aged Out   Meningococcal B Vaccine  Aged Out     Hep C Screening: done  STD testing and prevention (HIV/chl/gon/syphilis):  see above, no additional testing desired by pt today  Intimate partner violence:  no concerns, lives alone   Sexual History/Pain during Intercourse: Widowed  Menstrual History/LMP/Abnormal Bleeding:  no AUB No LMP recorded. Patient is postmenopausal.  Incontinence Symptoms: nocturia 2x for years, some incontinence, some urge incontinence   Breast cancer: due Last Mammogram: *see HM list above  Cervical cancer screening: aged out  Osteoporosis:   Discussion on osteoporosis per age, including high calcium  and vitamin D supplementation, weight bearing exercises Pt is supplementing with daily calcium /Vit D. 2024 Bone scan/dexa   Skin cancer:  Hx of skin CA -  NO Discussed atypical lesions   Colorectal cancer:   Colonoscopy is UTD Discussed concerning signs and sx of CRC, pt denies melena, hematochezia, change in BM pattern or caliber   Lung cancer:   Low Dose CT Chest recommended if Age 11-80 years, 20 pack-year currently smoking OR have quit w/in 15years. Patient does qualify.    Social History   Tobacco Use   Smoking status: Every Day    Current packs/day: 0.50    Average packs/day: 0.5 packs/day for 47.0 years (23.5 ttl pk-yrs)    Types: Cigarettes   Smokeless tobacco: Never  Vaping Use    Vaping status: Never Used  Substance Use Topics   Alcohol use: Yes    Alcohol/week: 2.0 standard drinks of alcohol    Types: 2 Glasses of wine per week   Drug use: No     Flowsheet Row Office Visit from 05/23/2024 in [facility]  AUDIT-C Score 2     Family History  Problem Relation Age of Onset   Hypertension Mother    Diabetes Mother    Heart disease Father 17       CABG   Stroke Father 47       Occurred during CABG   Diabetes Brother    Hypertension Brother    Atrial fibrillation Brother    Cirrhosis Brother    Kidney disease Maternal Grandmother    Breast cancer Paternal Grandmother      Blood pressure/Hypertension: BP Readings from Last 3 Encounters:  05/23/24 118/72  04/04/24 118/72  11/23/23 122/68    Weight/Obesity: Wt Readings from Last 3 Encounters:  05/23/24 226 lb (102.5 kg)  04/04/24 226 lb (102.5 kg)  11/23/23 220 lb (99.8 kg)   BMI Readings from Last 3 Encounters:  05/23/24 36.48 kg/m  04/04/24 36.48 kg/m  11/23/23 35.51 kg/m     Lipids:  Lab Results  Component Value Date   CHOL 143 05/22/2023   CHOL 141 09/14/2022   CHOL 125 05/16/2022   Lab Results  Component Value Date   HDL 58 05/22/2023   HDL 55 09/14/2022   HDL 52 05/16/2022   Lab Results  Component Value Date   LDLCALC 65 05/22/2023   LDLCALC 74 09/14/2022   LDLCALC 53 05/16/2022   Lab Results  Component Value Date   TRIG 110 05/22/2023   TRIG 61 09/14/2022   TRIG 112 05/16/2022   Lab Results  Component Value Date   CHOLHDL 2.5 05/22/2023   CHOLHDL 2.6 09/14/2022   CHOLHDL 2.4 05/16/2022   No results found for: LDLDIRECT Based on the results of lipid panel his/her cardiovascular risk factor ( using [facility] )  in the next 10 years is: The 10-year ASCVD risk score (Arnett DK, et al., 2019) is: 25.9%   Values used to calculate the score:     Age: 69 years     Clincally relevant sex: Female     Is Non-Hispanic African American:  No     Diabetic: Yes     Tobacco smoker: Yes     Systolic Blood Pressure: 118 mmHg     Is BP treated: No     HDL Cholesterol: 58 mg/dL     Total Cholesterol: 143 mg/dL  Glucose:  Glucose, Bld  Date Value Ref Range Status  04/04/2024 99 65 - 99 mg/dL Final    Comment:    .            Fasting reference interval .   11/23/2023 104 (H) 65 - 99 mg/dL Final    Comment:    .            Fasting reference interval . For someone without known diabetes, a glucose value between 100 and 125 mg/dL is consistent with prediabetes and should be confirmed with a follow-up test. .   08/03/2023 104 (H) 65 - 99 mg/dL Final    Comment:    .            Fasting reference interval . For someone without known diabetes, a glucose value between 100 and 125 mg/dL is consistent with prediabetes and should be confirmed with a follow-up test. .     Advanced Care Planning:  A voluntary discussion about advance care planning including the explanation and discussion of advance directives.     Social History       Social History   Socioeconomic History   Marital status: Widowed    Spouse name: Not on file   Number of children: Not on file   Years of education: Not on file   Highest education level: Some college, no degree  Occupational History   Not on file  Tobacco Use   Smoking status: Every Day    Current packs/day: 0.50    Average packs/day: 0.5 packs/day for 47.0 years (23.5 ttl pk-yrs)    Types: Cigarettes   Smokeless tobacco: Never  Vaping Use   Vaping status: Never Used  Substance and Sexual Activity   Alcohol use: Yes    Alcohol/week: 2.0 standard  drinks of alcohol    Types: 2 Glasses of wine per week   Drug use: No   Sexual activity: Not Currently  Other Topics Concern   Not on file  Social History Narrative   Not on file   Social Drivers of Health   Financial Resource Strain: Low Risk  (05/21/2024)   Overall Financial Resource Strain (CARDIA)    Difficulty of  Paying Living Expenses: Not hard at all  Food Insecurity: No Food Insecurity (05/21/2024)   Hunger Vital Sign    Worried About Running Out of Food in the Last Year: Never true    Ran Out of Food in the Last Year: Never true  Transportation Needs: No Transportation Needs (05/21/2024)   PRAPARE - Administrator, Civil Service (Medical): No    Lack of Transportation (Non-Medical): No  Physical Activity: Inactive (05/21/2024)   Exercise Vital Sign    Days of Exercise per Week: 0 days    Minutes of Exercise per Session: Not on file  Stress: No Stress Concern Present (05/21/2024)   [employer] of Occupational Health - Occupational Stress Questionnaire    Feeling of Stress: Only a little  Social Connections: Socially Isolated (05/21/2024)   Social Connection and Isolation Panel    Frequency of Communication with Friends and Family: More than three times a week    Frequency of Social Gatherings with Friends and Family: Once a week    Attends Religious Services: Never    Database administrator or Organizations: No    Attends Engineer, structural: Not on file    Marital Status: Widowed    Family History        Family History  Problem Relation Age of Onset   Hypertension Mother    Diabetes Mother    Heart disease Father 90       CABG   Stroke Father 70       Occurred during CABG   Diabetes Brother    Hypertension Brother    Atrial fibrillation Brother    Cirrhosis Brother    Kidney disease Maternal Grandmother    Breast cancer Paternal Grandmother     Patient Active Problem List   Diagnosis Date Noted   Cushing disease (HCC) 04/04/2024   Pituitary macroadenoma (HCC) 08/03/2023   Advanced care planning/counseling discussion 05/22/2023   Migraine 09/14/2022   Amnesia, global, transient 09/14/2022   Anxiety 09/14/2022   Lesion of pituitary gland 09/14/2022   Altered mental status 09/14/2022   Dyspnea on exertion 12/25/2021   Heart murmur 12/25/2021   New  onset type 2 diabetes mellitus (HCC) 11/15/2021   Prediabetes 11/09/2021   Lesion of face 11/09/2021   Insomnia 05/11/2021   OSA on CPAP 10/26/2018   Obesity (BMI 30.0-34.9) 01/18/2017   Panic attacks 01/18/2017   Current smoker 01/18/2017   Elevated hematocrit 07/20/2016   Coronary artery calcification 05/23/2016   Aortic atherosclerosis 05/23/2016   Centrilobular emphysema (HCC) 05/23/2016   Osteopenia of neck of femur 04/18/2016   History of colonic polyps    Benign neoplasm of sigmoid colon    Personal history of tobacco use, presenting hazards to health 04/13/2015   Hyperlipidemia LDL goal <70 08/07/2014    Past Surgical History:  Procedure Laterality Date   BREAST BIOPSY Left 07/07/2023   Stereo bx, ribbon clip, path pending   BREAST BIOPSY Left 07/07/2023   MM LT BREAST BX W LOC DEV 1ST LESION IMAGE BX SPEC STEREO GUIDE 07/07/2023 [facility]  BREAST CYST ASPIRATION Left    COLONOSCOPY  2006   COLONOSCOPY WITH PROPOFOL  N/A 05/15/2015   Procedure: COLONOSCOPY WITH PROPOFOL ;  Surgeon: Rogelia Copping, MD;  Location: [facility];  Service: Endoscopy;  Laterality: N/A;   COLONOSCOPY WITH PROPOFOL  N/A 06/02/2020   Procedure: COLONOSCOPY WITH PROPOFOL ;  Surgeon: Copping Rogelia, MD;  Location: [facility];  Service: Endoscopy;  Laterality: N/A;   EYE SURGERY     POLYPECTOMY  05/15/2015   Procedure: POLYPECTOMY;  Surgeon: Rogelia Copping, MD;  Location: [facility];  Service: Endoscopy;;     Current Outpatient Medications:    atorvastatin  (LIPITOR) 20 MG tablet, TAKE 1 TABLET BY MOUTH EVERYDAY AT BEDTIME, Disp: 90 tablet, Rfl: 0   blood glucose meter kit and supplies KIT, Dispense based on patient and insurance preference. Use up to four times daily as directed. (FOR ICD-9 250.00, 250.01)., Disp: 1 each, Rfl: 0   clonazePAM  (KLONOPIN ) 0.5 MG tablet, Take 0.5-1 tablets (0.25-0.5 mg total) by mouth daily as needed for anxiety. Never take within six hours of  alcohol, Disp: 20 tablet, Rfl: 0   Fish Oil-Cholecalciferol (FISH OIL + D3) 1000-1000 MG-UNIT CAPS, Take by mouth daily. , Disp: , Rfl:    glucose blood (CONTOUR NEXT TEST) test strip, 1 each by Other route QID. Check blood sugar up to 4 times daily, Disp: 360 each, Rfl: 1   meloxicam  (MOBIC ) 15 MG tablet, TAKE 1 TABLET (15 MG TOTAL) BY MOUTH DAILY., Disp: 30 tablet, Rfl: 2   metFORMIN  (GLUCOPHAGE ) 500 MG tablet, TAKE 1 TABLET BY MOUTH 2 TIMES DAILY WITH A MEAL., Disp: 180 tablet, Rfl: 0   Microlet Lancets MISC, 1 each by Does not apply route as needed., Disp: 100 each, Rfl: 6   Multiple Minerals-Vitamins (CALCIUM  & VIT D3 BONE HEALTH PO), Take 2 tablets by mouth daily. Calcium  600mg - Vitamin D 400mg , Disp: , Rfl:    psyllium (REGULOID) 0.52 G capsule, Take 0.52 g by mouth daily., Disp: , Rfl:    traZODone  (DESYREL ) 50 MG tablet, TAKE 1 TO 2 TABLETS BY MOUTH AT BEDTIME AS NEEDED FOR SLEEP, Disp: 180 tablet, Rfl: 0  No Known Allergies  Patient Care Team: Leavy Mole, PA-C as PCP - General (Family Medicine) End, Lonni, MD as PCP - Cardiology (Cardiology) Verdia Art, MD as Consulting Physician (Pulmonary Disease) End, Lonni, MD as Consulting Physician (Cardiology)   Chart Review: I personally reviewed active problem list, medication list, allergies, family history, social history, health maintenance, notes from last encounter, lab results, imaging with the patient/caregiver today.   Review of Systems  Constitutional: Negative.   HENT: Negative.    Eyes: Negative.   Respiratory: Negative.    Cardiovascular: Negative.   Gastrointestinal: Negative.   Endocrine: Negative.   Genitourinary: Negative.   Musculoskeletal: Negative.   Skin: Negative.   Allergic/Immunologic: Negative.   Neurological: Negative.   Hematological: Negative.   Psychiatric/Behavioral: Negative.    All other systems reviewed and are negative.         Objective:   Vitals:  Vitals:    05/23/24 0822  BP: 118/72  Pulse: 71  Resp: 16  SpO2: 99%  Weight: 226 lb (102.5 kg)  Height: 5' 6 (1.676 m)    Body mass index is 36.48 kg/m.  Physical Exam Vitals and nursing note reviewed.  Constitutional:      General: She is not in acute distress.    Appearance: Normal appearance. She is well-developed. She is obese. She is not ill-appearing,  toxic-appearing or diaphoretic.  HENT:     Head: Normocephalic and atraumatic.     Right Ear: External ear normal.     Left Ear: External ear normal.     Nose: Nose normal.  Eyes:     General: No scleral icterus.       Right eye: No discharge.        Left eye: No discharge.     Conjunctiva/sclera: Conjunctivae normal.  Neck:     Trachea: No tracheal deviation.  Cardiovascular:     Rate and Rhythm: Normal rate and regular rhythm.     Pulses: Normal pulses.     Heart sounds: Normal heart sounds. No murmur heard.    No friction rub. No gallop.  Pulmonary:     Effort: Pulmonary effort is normal. No respiratory distress.     Breath sounds: No stridor.  Abdominal:     Comments: Obese protuberant  Skin:    General: Skin is warm and dry.     Findings: Lesion present. No rash.  Neurological:     Mental Status: She is alert.     Motor: No abnormal muscle tone.     Coordination: Coordination normal.     Gait: Gait abnormal.  Psychiatric:        Mood and Affect: Mood normal.        Behavior: Behavior normal.        Diabetic Foot Exam - Simple   Simple Foot Form Diabetic Foot exam was performed with the following findings: Yes 05/23/2024  8:40 AM  Visual Inspection No deformities, no ulcerations, no other skin breakdown bilaterally: Yes Sensation Testing Intact to touch and monofilament testing bilaterally: Yes Pulse Check Posterior Tibialis and Dorsalis pulse intact bilaterally: Yes Comments      Fall Risk:    05/23/2024    8:12 AM 05/22/2023   10:27 AM 03/20/2023    8:56 AM 10/06/2022   10:28 AM 05/16/2022   10:09  AM  Fall Risk   Falls in the past year? 0 0 0 0 1  Number falls in past yr: 0 0 0 0 0  Injury with Fall? 0 0 0 0 1  Risk for fall due to : No Fall Risks No Fall Risks No Fall Risks No Fall Risks Impaired balance/gait;Impaired mobility  Follow up Falls prevention discussed Falls prevention discussed;Education provided;Falls evaluation completed Falls prevention discussed;Education provided;Falls evaluation completed Falls prevention discussed;Education provided;Falls evaluation completed Falls prevention discussed;Education provided      Data saved with a previous flowsheet row definition    Functional Status Survey: Is the patient deaf or have difficulty hearing?: No Does the patient have difficulty seeing, even when wearing glasses/contacts?: No Does the patient have difficulty concentrating, remembering, or making decisions?: No Does the patient have difficulty walking or climbing stairs?: No Does the patient have difficulty dressing or bathing?: No Does the patient have difficulty doing errands alone such as visiting a doctor's office or shopping?: No   Assessment & Plan:    CPE completed today  USPSTF grade A and B recommendations reviewed with patient; age-appropriate recommendations, preventive care, screening tests, etc discussed and encouraged; healthy living encouraged; see AVS for patient education given to patient  Discussed importance of 150 minutes of physical activity weekly, AHA exercise recommendations given to pt in AVS/handout  Discussed importance of healthy diet:  eating lean meats and proteins, avoiding trans fats and saturated fats, avoid simple sugars and excessive carbs in diet, eat 6 servings of  fruit/vegetables daily and drink plenty of water  and avoid sweet beverages.    Recommended pt to do annual eye exam and routine dental exams/cleanings  Depression, alcohol, fall screening completed as documented above and per flowsheets  Advance Care planning  information and packet discussed and offered today, encouraged pt to discuss with family members/spouse/partner/friends and complete Advanced directive packet and bring copy to office   Reviewed Health Maintenance: Health Maintenance  Topic Date Due   OPHTHALMOLOGY EXAM  12/19/2023   FOOT EXAM  05/21/2024   Mammogram  06/18/2024   COVID-19 Vaccine (8 - 2024-25 season) 06/08/2024 (Originally 04/22/2024)   HEMOGLOBIN A1C  05/24/2024   Lung Cancer Screening  11/02/2024   Diabetic kidney evaluation - Urine ACR  11/22/2024   Diabetic kidney evaluation - eGFR measurement  04/04/2025   Colonoscopy  06/02/2025   DEXA SCAN  08/02/2025   DTaP/Tdap/Td (3 - Td or Tdap) 03/31/2032   Pneumococcal Vaccine: 50+ Years  Completed   Influenza Vaccine  Completed   Hepatitis C Screening  Completed   Zoster Vaccines- Shingrix   Completed   HPV VACCINES  Aged Out   Meningococcal B Vaccine  Aged Out    Immunizations: Immunization History  Administered Date(s) Administered   Fluad Quad(high Dose 65+) 05/03/2019, 05/06/2020, 05/11/2021, 05/16/2022   Fluad Trivalent(High Dose 65+) 05/22/2023   INFLUENZA, HIGH DOSE SEASONAL PF 04/26/2018, 05/23/2024   Influenza,inj,Quad PF,6+ Mos 04/18/2016, 04/20/2017   PFIZER(Purple Top)SARS-COV-2 Vaccination 10/11/2019, 11/01/2019, 06/05/2020, 12/14/2020, 06/18/2021   Pfizer Covid-19 Vaccine Bivalent Booster 36yrs & up 05/20/2022   Pfizer(Comirnaty)Fall Seasonal Vaccine 12 years and older 06/08/2023   Pneumococcal Conjugate-13 04/26/2018   Pneumococcal Polysaccharide-23 08/19/2013, 05/03/2019   Tdap 02/07/2012, 03/31/2022   Zoster Recombinant(Shingrix ) 06/15/2018, 08/27/2018   Vaccines:  Shingrix : done 68-64 yo and ask insurance if covered when patient above 79 yo Pneumonia: UTD -  educated and discussed with patient. Flu: due educated and discussed with patient.     ICD-10-CM   1. Well adult exam  Z00.00 HgB A1c    Comprehensive Metabolic Panel (CMET)    CBC  with Differential/Platelet    Lipid Profile    2. Hyperlipidemia LDL goal <70  E78.5 Lipid Profile    3. Type 2 diabetes mellitus without complication, without long-term current use of insulin  (HCC)  E11.9 HgB A1c    Comprehensive Metabolic Panel (CMET)    4. Breast cancer screening by mammogram  Z12.31 MM 3D SCREENING MAMMOGRAM BILATERAL BREAST    5. Immunization due  Z23 Flu vaccine HIGH DOSE PF(Fluzone Trivalent)    6. Skin lesion  L98.9 Ambulatory referral to Dermatology   to left cheek and face, req derm consult with alternate derm, irritates, sometimes picks at it bleeds or gets inflamed/red, [place] skin consult    7. Insomnia, unspecified type  G47.00 traZODone  (DESYREL ) 50 MG tablet   Stable with trazodone  50 mg at bedtime    8. Panic attacks  F41.0 clonazePAM  (KLONOPIN ) 0.5 MG tablet   med refilled after reviewing controlled substance database - no concerns      Return in about 1 year (around 05/23/2025) for CPE, 6 month routine f/up DM/HLD.     Michelene Cower, PA-C 05/23/24 8:40 AM  [facility]

## 2024-05-24 ENCOUNTER — Ambulatory Visit: Payer: Self-pay | Admitting: Family Medicine

## 2024-05-24 DIAGNOSIS — I2510 Atherosclerotic heart disease of native coronary artery without angina pectoris: Secondary | ICD-10-CM

## 2024-05-24 DIAGNOSIS — I700 Atherosclerosis of aorta: Secondary | ICD-10-CM

## 2024-05-24 DIAGNOSIS — E119 Type 2 diabetes mellitus without complications: Secondary | ICD-10-CM

## 2024-05-24 DIAGNOSIS — E785 Hyperlipidemia, unspecified: Secondary | ICD-10-CM

## 2024-05-24 DIAGNOSIS — M25552 Pain in left hip: Secondary | ICD-10-CM

## 2024-05-24 LAB — HEMOGLOBIN A1C
Hgb A1c MFr Bld: 6.9 % — ABNORMAL HIGH (ref <5.7)
Mean Plasma Glucose: 151 mg/dL
eAG (mmol/L): 8.4 mmol/L

## 2024-05-24 LAB — CBC WITH DIFFERENTIAL/PLATELET
Absolute Lymphocytes: 1563 {cells}/uL (ref 850–3900)
Absolute Monocytes: 678 {cells}/uL (ref 200–950)
Basophils Absolute: 77 {cells}/uL (ref 0–200)
Basophils Relative: 1 %
Eosinophils Absolute: 169 {cells}/uL (ref 15–500)
Eosinophils Relative: 2.2 %
HCT: 45.4 % — ABNORMAL HIGH (ref 35.0–45.0)
Hemoglobin: 14.9 g/dL (ref 11.7–15.5)
MCH: 32.6 pg (ref 27.0–33.0)
MCHC: 32.8 g/dL (ref 32.0–36.0)
MCV: 99.3 fL (ref 80.0–100.0)
MPV: 12.1 fL (ref 7.5–12.5)
Monocytes Relative: 8.8 %
Neutro Abs: 5213 {cells}/uL (ref 1500–7800)
Neutrophils Relative %: 67.7 %
Platelets: 252 Thousand/uL (ref 140–400)
RBC: 4.57 Million/uL (ref 3.80–5.10)
RDW: 12.3 % (ref 11.0–15.0)
Total Lymphocyte: 20.3 %
WBC: 7.7 Thousand/uL (ref 3.8–10.8)

## 2024-05-24 LAB — LIPID PANEL
Cholesterol: 111 mg/dL (ref <200)
HDL: 55 mg/dL (ref >=50)
LDL Cholesterol (Calc): 41 mg/dL
Non-HDL Cholesterol (Calc): 56 mg/dL (ref <130)
Total CHOL/HDL Ratio: 2 (calc) (ref <5.0)
Triglycerides: 73 mg/dL (ref <150)

## 2024-05-24 LAB — COMPREHENSIVE METABOLIC PANEL WITH GFR
AG Ratio: 1.8 (calc) (ref 1.0–2.5)
ALT: 25 U/L (ref 6–29)
AST: 13 U/L (ref 10–35)
Albumin: 4.1 g/dL (ref 3.6–5.1)
Alkaline phosphatase (APISO): 83 U/L (ref 37–153)
BUN: 17 mg/dL (ref 7–25)
CO2: 29 mmol/L (ref 20–32)
Calcium: 9.8 mg/dL (ref 8.6–10.4)
Chloride: 105 mmol/L (ref 98–110)
Creat: 0.85 mg/dL (ref 0.60–1.00)
Globulin: 2.3 g/dL (ref 1.9–3.7)
Glucose, Bld: 119 mg/dL — ABNORMAL HIGH (ref 65–99)
Potassium: 4.4 mmol/L (ref 3.5–5.3)
Sodium: 143 mmol/L (ref 135–146)
Total Bilirubin: 0.3 mg/dL (ref 0.2–1.2)
Total Protein: 6.4 g/dL (ref 6.1–8.1)
eGFR: 73 mL/min/1.73m2 (ref >=60)

## 2024-05-24 MED ORDER — METFORMIN HCL 500 MG PO TABS: 500.0000 mg | ORAL_TABLET | Freq: Two times a day (BID) | ORAL | 1 refills | Status: AC

## 2024-05-24 MED ORDER — ATORVASTATIN CALCIUM 20 MG PO TABS: ORAL_TABLET | ORAL | 3 refills | Status: AC

## 2024-05-24 MED ORDER — MELOXICAM 15 MG PO TABS: 15.0000 mg | ORAL_TABLET | Freq: Every day | ORAL | 2 refills | Status: AC

## 2024-05-27 ENCOUNTER — Ambulatory Visit

## 2024-05-27 DIAGNOSIS — Z1283 Encounter for screening for malignant neoplasm of skin: Secondary | ICD-10-CM | POA: Diagnosis not present

## 2024-05-27 DIAGNOSIS — L578 Other skin changes due to chronic exposure to nonionizing radiation: Secondary | ICD-10-CM

## 2024-05-27 DIAGNOSIS — L820 Inflamed seborrheic keratosis: Secondary | ICD-10-CM | POA: Diagnosis not present

## 2024-05-27 DIAGNOSIS — W908XXA Exposure to other nonionizing radiation, initial encounter: Secondary | ICD-10-CM

## 2024-05-27 DIAGNOSIS — L821 Other seborrheic keratosis: Secondary | ICD-10-CM | POA: Diagnosis not present

## 2024-05-27 DIAGNOSIS — D489 Neoplasm of uncertain behavior, unspecified: Secondary | ICD-10-CM

## 2024-05-27 DIAGNOSIS — L814 Other melanin hyperpigmentation: Secondary | ICD-10-CM | POA: Diagnosis not present

## 2024-05-27 DIAGNOSIS — D1801 Hemangioma of skin and subcutaneous tissue: Secondary | ICD-10-CM

## 2024-05-27 DIAGNOSIS — L570 Actinic keratosis: Secondary | ICD-10-CM

## 2024-05-27 DIAGNOSIS — B079 Viral wart, unspecified: Secondary | ICD-10-CM

## 2024-05-27 DIAGNOSIS — B078 Other viral warts: Secondary | ICD-10-CM | POA: Diagnosis not present

## 2024-05-27 NOTE — Patient Instructions (Signed)

## 2024-05-27 NOTE — Progress Notes (Signed)
 Subjective   Kathy Gregory is a 73 y.o. female who presents for the following: Lesion(s) of concern . Patient is new patient  Today patient reports: Areas of concern on her left cheek and her scalp.   Review of Systems:    No other skin or systemic complaints except as noted in HPI or Assessment and Plan.  The following portions of the chart were reviewed this encounter and updated as appropriate: medications, allergies, medical history  Relevant Medical History:  n/a   Objective  Well appearing patient in no apparent distress; mood and affect are within normal limits. Examination was performed of the: Waist Up Skin Exam: scalp, head, eyes, ears, nose, lips, neck, chest, axillae, upper extremities, abdomen, back, hands, fingers, fingernails   Examination notable for: Lentigo/lentigines: Scattered pigmented macules that are tan to [place] in color and are somewhat non-uniform in shape and concentrated in the sun-exposed areas, Seborrheic Keratosis(es): Stuck-on appearing keratotic papule(s) on the trunk, some  irritated with redness, crusting, edema, and/or partial avulsion, Actinic Damage/Elastosis: chronic sun damage: dyspigmentation, telangiectasia, and wrinkling, Actinic Purpura: Diffuse atrophy with scattered purpuric patches 1-3 cm in size on the bilateral forearms and dorsal hands. There is evidence of post-inflammatory hyperpigmentation with an orange-Thiemann color change - Multiple scaly erythematous macules consistent with actinic keratoses located on face   Examination limited by: Clothing and Patient deferred removal     Left Cheek 1cm hyperkeratotic papule   Face x2 (2) Pink scaly macules Scalp Stuck on waxy paps with erythema  Assessment & Plan   SKIN CANCER SCREENING PERFORMED TODAY.  BENIGN SKIN FINDINGS  - Lentigines  - Seborrheic keratoses  - Hemangiomas   - Nevus/Multiple Benign Nevi - Reassurance provided regarding the benign appearance of lesions noted  on exam today; no treatment is indicated in the absence of symptoms/changes. - Reinforced importance of photoprotective strategies including liberal and frequent sunscreen use of a broad-spectrum SPF 30 or greater, use of protective clothing, and sun avoidance for prevention of cutaneous malignancy and photoaging.  Counseled patient on the importance of regular self-skin monitoring as well as routine clinical skin examinations as scheduled.   ACTINIC DAMAGE - Chronic condition, secondary to cumulative UV/sun exposure - Recommend daily broad spectrum sunscreen SPF 30+ to sun-exposed areas, reapply every 2 hours as needed.  - Staying in the shade or wearing long sleeves, sun glasses (UVA+UVB protection) and wide brim hats (4-inch brim around the entire circumference of the hat) are also recommended for sun protection.  - Call for new or changing lesions.  Level of service outlined above   Procedures, orders, diagnosis for this visit:  NEOPLASM OF UNCERTAIN BEHAVIOR Left Cheek Skin / nail biopsy Type of biopsy: tangential   Informed consent: discussed and consent obtained   Timeout: patient name, date of birth, surgical site, and procedure verified   Patient was prepped and draped in usual sterile fashion: Area prepped with alcohol. Anesthesia: the lesion was anesthetized in a standard fashion   Anesthetic:  1% lidocaine  w/ epinephrine  1-100,000 buffered w/ 8.4% NaHCO3 Instrument used: flexible razor blade   Hemostasis achieved with: pressure, aluminum chloride and electrodesiccation   Outcome: patient tolerated procedure well   Post-procedure details: wound care instructions given   Post-procedure details comment:  Ointment and small bandage applied  Specimen 1 - Surgical pathology Differential Diagnosis: ISK vs SCC  Check Margins: No ACTINIC KERATOSIS (2) Face x2 (2) Actinic keratoses are precancerous spots that appear secondary to cumulative UV radiation  exposure/sun exposure over  time. They are chronic with expected duration over 1 year. A portion of actinic keratoses will progress to squamous cell carcinoma of the skin. It is not possible to reliably predict which spots will progress to skin cancer and so treatment is recommended to prevent development of skin cancer.  Recommend daily broad spectrum sunscreen SPF 30+ to sun-exposed areas, reapply every 2 hours as needed.  Recommend staying in the shade or wearing long sleeves, sun glasses (UVA+UVB protection) and wide brim hats (4-inch brim around the entire circumference of the hat). Call for new or changing lesions. Destruction of lesion - Face x2 (2) Complexity: simple   Destruction method: cryotherapy   Informed consent: discussed and consent obtained   Timeout:  patient name, date of birth, surgical site, and procedure verified Lesion destroyed using liquid nitrogen: Yes   Region frozen until ice ball extended beyond lesion: Yes   Cryo cycles: 1 or 2. Outcome: patient tolerated procedure well with no complications   Post-procedure details: wound care instructions given    INFLAMED SEBORRHEIC KERATOSIS Scalp Symptomatic, irritating, patient would like treated. Destruction of lesion - Scalp Complexity: simple   Destruction method: cryotherapy   Informed consent: discussed and consent obtained   Timeout:  patient name, date of birth, surgical site, and procedure verified Lesion destroyed using liquid nitrogen: Yes   Region frozen until ice ball extended beyond lesion: Yes   Cryo cycles: 1 or 2. Outcome: patient tolerated procedure well with no complications   Post-procedure details: wound care instructions given     Neoplasm of uncertain behavior -     Skin / nail biopsy -     Surgical pathology; Standing  Actinic keratosis -     Destruction of lesion  Inflamed seborrheic keratosis -     Destruction of lesion    Return to clinic: Return in about 6 months (around 11/25/2024) for  TBSE.  Documentation:  I, Emerick Ege, CMA am acting as scribe for Lauraine JAYSON Kanaris, MD  I have reviewed the above documentation for accuracy and completeness, and I agree with the above.  Lauraine JAYSON Kanaris, MD

## 2024-05-29 ENCOUNTER — Ambulatory Visit: Payer: Self-pay

## 2024-05-29 LAB — SURGICAL PATHOLOGY

## 2024-05-29 NOTE — Telephone Encounter (Signed)
Patient advised of BX results. Aw

## 2024-05-29 NOTE — Telephone Encounter (Signed)
Left message on voicemail to return my call.  

## 2024-05-29 NOTE — Telephone Encounter (Signed)
-----   Message from Lauraine JAYSON Kanaris sent at 05/29/2024  3:34 PM EDT -----    1. Skin, left cheek :       VERRUCA VULGARIS  Please notify patient with below plan: - benign verruca/wart - likely fully removed with biopsy. If recurs, can consider further tx.  ----- Message ----- From: Interface, [facility] Sent: 05/29/2024   2:59 PM EDT To: Lauraine JAYSON Kanaris, MD

## 2024-06-07 ENCOUNTER — Ambulatory Visit: Admitting: Neurosurgery

## 2024-06-11 ENCOUNTER — Ambulatory Visit: Admitting: Neurosurgery

## 2024-06-11 VITALS — BP 130/75 | HR 76 | Ht 66.5 in | Wt 224.8 lb

## 2024-06-11 DIAGNOSIS — D352 Benign neoplasm of pituitary gland: Secondary | ICD-10-CM

## 2024-06-11 NOTE — Progress Notes (Unsigned)
 Assessment : Discussed the use of AI scribe software for clinical note transcription with the patient, who gave verbal consent to proceed.  History of Present Illness Kathy Gregory is a 73 year old female who presents for evaluation of elevated cortisol levels. She was referred by her endocrinologist, Dr. Caroline, for evaluation of elevated cortisol levels and potential pituitary tumor removal.  In January 2024, she experienced an episode of transient global amnesia, characterized by an inability to form new memories and confusion. Her daughter took her to the hospital, where a stroke was ruled out, and a neurologist diagnosed her with transient global amnesia. An MRI at that time revealed a 1.3 cm cyst on her pituitary gland.  Following the MRI findings, she consulted her primary care physician, who referred her to an endocrinologist for further evaluation of her pituitary gland function. Various tests, including blood, saliva, and urine tests, and a dexamethasone  suppression test, indicated elevated cortisol levels.  No headaches, visual problems, weight gain, stretch marks, or galactorrhea. She continues to manage her daily activities and works in an office setting, primarily using a Animator.    Plan : It is interesting that her mother also had a pituitary adenoma which unfortunately was discovered as she was losing vision.  I went over the pituitary adenoma anatomy and I reviewed the images with her.  I think that the team at [facility] indeed believes that she does not have Cushing's.  I do not think that she has clinical manifestation of Cushing's however she has an abnormal dexamethasone  suppression test with a slightly elevated ACTH .  She may have a very mild form of this and therefore not have a buffalo hump or stretch marks.  Regardless, she has a 1.3 cm cystic mass in her pituitary gland with contact with the chiasm but fortunately she does not have any visual loss.  I shared  with her that I would like to start with getting a repeat MRI as the previous 74 is a year old.  After that, we are going to talk about the option of doing nothing versus transsphenoidal resection and I will want to talk to her about the risks and benefits.  I requested her to bring a family member along.  I also will make an appointment with Dr. Tobie, otolaryngologist, the same day as she comes back for the MRI and to see me so that she does not have to make additional trips.  She is eager to pursue this route and I will see her back in a few weeks.   Social History   Socioeconomic History   Marital status: Widowed    Spouse name: Not on file   Number of children: Not on file   Years of education: Not on file   Highest education level: Some college, no degree  Occupational History   Not on file  Tobacco Use   Smoking status: Every Day    Current packs/day: 0.50    Average packs/day: 0.5 packs/day for 47.0 years (23.5 ttl pk-yrs)    Types: Cigarettes   Smokeless tobacco: Never  Vaping Use   Vaping status: Never Used  Substance and Sexual Activity   Alcohol use: Yes    Alcohol/week: 2.0 standard drinks of alcohol    Types: 2 Glasses of wine per week   Drug use: No   Sexual activity: Not Currently  Other Topics Concern   Not on file  Social History Narrative   Not on file   Social Drivers  of Health   Financial Resource Strain: Low Risk  (05/21/2024)   Overall Financial Resource Strain (CARDIA)    Difficulty of Paying Living Expenses: Not hard at all  Food Insecurity: No Food Insecurity (05/21/2024)   Hunger Vital Sign    Worried About Running Out of Food in the Last Year: Never true    Ran Out of Food in the Last Year: Never true  Transportation Needs: No Transportation Needs (05/21/2024)   PRAPARE - Administrator, Civil Service (Medical): No    Lack of Transportation (Non-Medical): No  Physical Activity: Inactive (05/21/2024)   Exercise Vital Sign    Days of  Exercise per Week: 0 days    Minutes of Exercise per Session: Not on file  Stress: No Stress Concern Present (05/21/2024)   [employer] of Occupational Health - Occupational Stress Questionnaire    Feeling of Stress: Only a little  Social Connections: Socially Isolated (05/21/2024)   Social Connection and Isolation Panel    Frequency of Communication with Friends and Family: More than three times a week    Frequency of Social Gatherings with Friends and Family: Once a week    Attends Religious Services: Never    Database administrator or Organizations: No    Attends Engineer, structural: Not on file    Marital Status: Widowed  Intimate Partner Violence: Not At Risk (05/23/2024)   Humiliation, Afraid, Rape, and Kick questionnaire    Fear of Current or Ex-Partner: No    Emotionally Abused: No    Physically Abused: No    Sexually Abused: No    Family History  Problem Relation Age of Onset   Hypertension Mother    Diabetes Mother    Heart disease Father 23       CABG   Stroke Father 39       Occurred during CABG   Diabetes Brother    Hypertension Brother    Atrial fibrillation Brother    Cirrhosis Brother    Kidney disease Maternal Grandmother    Breast cancer Paternal Grandmother     No Known Allergies  Past Medical History:  Diagnosis Date   Advanced care planning/counseling discussion 05/22/2023   Anxiety    Arthritis    hips, collarbone   Cataract    surgery in may 2021   COPD (chronic obstructive pulmonary disease) (HCC)    Coronary artery calcification    Depression    Diabetes (HCC)    Emphysema of lung (HCC)    Heart murmur 07/24/2016   Hyperlipidemia    Migraines    4-5 per year   Osteoporosis    Other fatigue 12/29/2017   Scoliosis    mild   Sleep apnea    Vertigo    2-3x/yr    Past Surgical History:  Procedure Laterality Date   BREAST BIOPSY Left 07/07/2023   Stereo bx, ribbon clip, path pending   BREAST BIOPSY Left 07/07/2023    MM LT BREAST BX W LOC DEV 1ST LESION IMAGE BX SPEC STEREO GUIDE 07/07/2023 [facility]   BREAST CYST ASPIRATION Left    COLONOSCOPY  2006   COLONOSCOPY WITH PROPOFOL  N/A 05/15/2015   Procedure: COLONOSCOPY WITH PROPOFOL ;  Surgeon: Rogelia Copping, MD;  Location: [facility];  Service: Endoscopy;  Laterality: N/A;   COLONOSCOPY WITH PROPOFOL  N/A 06/02/2020   Procedure: COLONOSCOPY WITH PROPOFOL ;  Surgeon: Copping Rogelia, MD;  Location: [facility];  Service: Endoscopy;  Laterality: N/A;   EYE  SURGERY     POLYPECTOMY  05/15/2015   Procedure: POLYPECTOMY;  Surgeon: Rogelia Copping, MD;  Location: [facility];  Service: Endoscopy;;     Physical Exam     Results for orders placed or performed during the hospital encounter of 03/27/23  MR Brain W Wo Contrast   Narrative   CLINICAL DATA:  Pituitary adenoma suspected. 1.3 cm cystic lesion in the left aspect of the pituitary gland. MRI six-month follow-up was recommended.  EXAM: MRI HEAD WITHOUT AND WITH CONTRAST  TECHNIQUE: Multiplanar, multiecho pulse sequences of the brain and surrounding structures were obtained without and with intravenous contrast.  CONTRAST:  9mL GADAVIST  GADOBUTROL  1 MMOL/ML IV SOLN  COMPARISON:  MRI brain 09/14/2022.  FINDINGS: Brain: No acute infarct or hemorrhage. No hydrocephalus or extra-axial collection. No foci of abnormal susceptibility.  Pituitary/Sella: Unchanged cystic lesion in the left aspect of the sella, measuring up to 12 x 10 x 12 mm (sagittal image 7 series 25, coronal image 8 series 24) with internal septation (coronal image 7 series 24), most consistent with a cystic pituitary adenoma. A normal posterior pituitary bright spot is seen. The infundibulum is deviated to the right. The hypothalamus and mamillary bodies are normal. There is no mass effect on the optic chiasm or optic nerves. The infundibular and chiasmatic recesses are clear. Normal cavernous sinus and  cavernous internal carotid artery flow voids.  Vascular: Normal flow voids and vessel enhancement.  Skull and upper cervical spine: Normal marrow signal and enhancement.  Sinuses/Orbits: No acute findings.  Other: None.  IMPRESSION: Unchanged cystic lesion in the left aspect of the sella, measuring up to 12 mm with internal septation, most consistent with a cystic pituitary adenoma.   Electronically Signed   By: Ryan Chess M.D.   On: 04/05/2023 13:34   Results for orders placed or performed during the hospital encounter of 09/14/22  MR BRAIN WO CONTRAST   Narrative   CLINICAL DATA:  Initial evaluation for neuro deficit, stroke suspected.  EXAM: MRI HEAD WITHOUT CONTRAST  TECHNIQUE: Multiplanar, multiecho pulse sequences of the brain and surrounding structures were obtained without intravenous contrast.  COMPARISON:  Prior CT from 09/13/2022.  FINDINGS: Brain: Cerebral volume within normal limits. No focal parenchymal signal abnormality or significant cerebral white matter disease for age.  No evidence for acute or subacute ischemia. Gray-white matter differentiation maintained. No areas of chronic cortical infarction. No acute or chronic intracranial blood products.  No mass lesion, midline shift or mass effect. No hydrocephalus or extra-axial fluid collection.  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 Mild suprasellar extension without mass effect on the optic chiasm.  Vascular: Major intracranial vascular flow voids are maintained.  Skull and upper cervical spine: Craniocervical junction within normal limits. Bone marrow signal intensity normal. No scalp soft tissue abnormality.  Sinuses/Orbits: Prior bilateral ocular lens replacement. Scattered mucosal thickening noted about the sphenoethmoidal sinuses. Paranasal sinuses are  otherwise largely clear. No mastoid effusion.  Other: None.  IMPRESSION: 1. No acute intracranial abnormality. 2. 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 Correlation with endocrine function tests recommended. Follow-up pituitary protocol MRI with and without contrast should be performed in 6-12 months. This follows ACR consensus guidelines: Management of Incidental Pituitary Findings on CT, MRI and F18-FDG PET: A White Paper of the  ACR Incidental Findings Committee. J Am Coll Radiol 2018; 15: 033-27. 3. Otherwise normal brain MRI for age.   Electronically Signed   By: Morene Hoard M.D.   On: 09/14/2022 02:55   CT Head Wo Contrast   Narrative   CLINICAL DATA:  Armour loss and altered mental status.  EXAM: CT HEAD WITHOUT CONTRAST  TECHNIQUE: Contiguous axial images were obtained from the base of the skull through the vertex without intravenous contrast.  RADIATION DOSE REDUCTION: This exam was performed according to the departmental dose-optimization program which includes automated exposure control, adjustment of the mA and/or kV according to patient size and/or use of iterative reconstruction technique.  COMPARISON:  None Available.  FINDINGS: Brain: There is mild cerebral atrophy with widening of the extra-axial spaces and ventricular dilatation. There are areas of decreased attenuation within the white matter tracts of the supratentorial brain, consistent with microvascular disease changes.  Vascular: No hyperdense vessel or unexpected calcification.  Skull: Normal. Negative for fracture or focal lesion.  Sinuses/Orbits: No acute finding.  Other: None.  IMPRESSION: 1. No acute intracranial abnormality. 2. Generalized cerebral atrophy and microvascular disease changes of the supratentorial brain.   Electronically Signed   By: Suzen Dials M.D.   On:  09/13/2022 20:23

## 2024-06-13 ENCOUNTER — Encounter (INDEPENDENT_AMBULATORY_CARE_PROVIDER_SITE_OTHER): Payer: Self-pay

## 2024-06-20 ENCOUNTER — Ambulatory Visit
Admission: RE | Admit: 2024-06-20 | Discharge: 2024-06-20 | Disposition: A | Discharge status: Home / Self Care | Source: Ambulatory Visit | Attending: Neurosurgery | Admitting: Neurosurgery

## 2024-06-20 DIAGNOSIS — D352 Benign neoplasm of pituitary gland: Secondary | ICD-10-CM

## 2024-06-20 MED ORDER — GADOPICLENOL 0.5 MMOL/ML IV SOLN
10.0000 mL | Freq: Once | INTRAVENOUS | Status: AC | PRN
  Administered 2024-06-20: 10 mL via INTRAVENOUS

## 2024-06-25 ENCOUNTER — Institutional Professional Consult (permissible substitution) (INDEPENDENT_AMBULATORY_CARE_PROVIDER_SITE_OTHER): Admitting: Otolaryngology

## 2024-06-25 ENCOUNTER — Ambulatory Visit: Admitting: Neurosurgery

## 2024-06-27 ENCOUNTER — Ambulatory Visit
Admission: RE | Admit: 2024-06-27 | Discharge: 2024-06-27 | Disposition: A | Discharge status: Home / Self Care | Source: Ambulatory Visit | Attending: Family Medicine | Admitting: Family Medicine

## 2024-06-27 DIAGNOSIS — Z1231 Encounter for screening mammogram for malignant neoplasm of breast: Secondary | ICD-10-CM | POA: Insufficient documentation

## 2024-06-28 ENCOUNTER — Ambulatory Visit: Admitting: Neurosurgery

## 2024-07-02 ENCOUNTER — Institutional Professional Consult (permissible substitution) (INDEPENDENT_AMBULATORY_CARE_PROVIDER_SITE_OTHER): Admitting: Otolaryngology

## 2024-07-02 ENCOUNTER — Ambulatory Visit: Admitting: Neurosurgery

## 2024-07-02 ENCOUNTER — Encounter: Payer: Self-pay | Admitting: Neurosurgery

## 2024-07-02 VITALS — BP 136/79 | HR 79 | Temp 97.9°F | Ht 67.5 in | Wt 222.2 lb

## 2024-07-02 DIAGNOSIS — D352 Benign neoplasm of pituitary gland: Secondary | ICD-10-CM | POA: Diagnosis not present

## 2024-07-02 NOTE — Progress Notes (Signed)
 73 year old lady with a cystic pituitary adenoma with an abnormal dexamethasone  suppression status.  Patient had a repeat MRI and this shows that there has been no significant change in the size of the cyst.  It is not abutting the optic chiasm.  I talked to the patient about the surgical option but she is not convinced that she needs surgery.  She feels that her cortisol levels are not high enough to recommend surgery.  I told her that if that is the case, she deserves a conversation with her endocrinology team again to talk about the importance of a low cortisol and if she does not want to achieve this with surgery, whether there are any medical options for this.  She says that she would much rather go this route.  To that end, we will cancel the appointment with Dr. Tobie, otolaryngology this afternoon so that she can have a better understanding of what her options are in decide what she wants to do.    If after talking to her endocrinology team, she feels that she wants to have the surgery, she is going to make an appointment with me again.  If not, then I will get an MRI of the pituitary gland in 1 year.  This last time she had severe vertigo after this and I was not convinced whether it was from the diet and therefore I will get this pituitary MRI without contrast.  I will see her back thereafter.

## 2024-07-11 DIAGNOSIS — H40051 Ocular hypertension, right eye: Secondary | ICD-10-CM | POA: Diagnosis not present

## 2024-07-13 ENCOUNTER — Other Ambulatory Visit

## 2024-07-29 ENCOUNTER — Ambulatory Visit

## 2024-07-29 DIAGNOSIS — L821 Other seborrheic keratosis: Secondary | ICD-10-CM

## 2024-07-29 DIAGNOSIS — B079 Viral wart, unspecified: Secondary | ICD-10-CM

## 2024-07-29 DIAGNOSIS — L820 Inflamed seborrheic keratosis: Secondary | ICD-10-CM

## 2024-07-29 NOTE — Progress Notes (Signed)
    Subjective   Kathy Gregory is a 73 y.o. female who presents for the following: Follow up of biopsy. Patient is established patient .  Today patient reports: Patient here for biopsy f/u. Biopsy showed verruca vulgaris.   Defers FBSE   Review of Systems:    No other skin or systemic complaints except as noted in HPI or Assessment and Plan.  The following portions of the chart were reviewed this encounter and updated as appropriate: medications, allergies, medical history  Relevant Medical History:  n/a   Objective  (SKPE) Well appearing patient in no apparent distress; mood and affect are within normal limits. Examination was performed of the: Focused Exam of: Face   Examination notable for: Seborrheic Keratosis(es): Stuck-on appearing keratotic papule(s) on the trunk, none  irritated with redness, crusting, edema, and/or partial avulsion.  Well healed bx site  Examination limited by: Undergarments, Shoes or socks , and Clothing       Assessment & Plan  (SKAP)   Benign Lesions/ Findings:  - Seborrheic keratosis  - Reassurance provided regarding the benign appearance of lesions noted on exam today; no treatment is indicated in the absence of symptoms/changes. - Reinforced importance of photoprotective strategies including liberal and frequent sunscreen use of a broad-spectrum SPF 30 or greater, use of protective clothing, and sun avoidance for prevention of cutaneous malignancy and photoaging.  Counseled patient on the importance of regular self-skin monitoring as well as routine clinical skin examinations as scheduled.  - defers cryotherapy   Verruca vulgaris Bx proven 05/27/24 - Resolved, patient advised if returns to let us  know.   Level of service outlined above   Patient instructions (SKPI)   Procedures, orders, diagnosis for this visit:    There are no diagnoses linked to this encounter.  Return to clinic: Return in about 6 months (around 01/27/2025) for TBSE,  w/ Dr. Raymund.  I, Jacquelynn V. Wilfred, CMA, am acting as scribe for Lauraine JAYSON Raymund, MD.  Documentation: I have reviewed the above documentation for accuracy and completeness, and I agree with the above.  Lauraine JAYSON Raymund, MD

## 2024-07-29 NOTE — Patient Instructions (Signed)

## 2024-08-21 DIAGNOSIS — G4733 Obstructive sleep apnea (adult) (pediatric): Secondary | ICD-10-CM | POA: Diagnosis not present

## 2024-10-01 ENCOUNTER — Ambulatory Visit: Admitting: Student

## 2025-01-27 ENCOUNTER — Ambulatory Visit

## 2025-06-21 ENCOUNTER — Other Ambulatory Visit

## 2025-06-27 ENCOUNTER — Ambulatory Visit: Admitting: Neurosurgery
